# Patient Record
Sex: Female | Born: 1980
Health system: Southern US, Community
[De-identification: ages and names within clinical notes are randomized; demographics above are authoritative.]

## PROBLEM LIST (undated history)

## (undated) DIAGNOSIS — M549 Dorsalgia, unspecified: Secondary | ICD-10-CM

## (undated) DIAGNOSIS — J45909 Unspecified asthma, uncomplicated: Secondary | ICD-10-CM

## (undated) DIAGNOSIS — O99345 Other mental disorders complicating the puerperium: Secondary | ICD-10-CM

## (undated) DIAGNOSIS — F418 Other specified anxiety disorders: Secondary | ICD-10-CM

## (undated) DIAGNOSIS — R5383 Other fatigue: Secondary | ICD-10-CM

## (undated) DIAGNOSIS — F909 Attention-deficit hyperactivity disorder, unspecified type: Secondary | ICD-10-CM

## (undated) DIAGNOSIS — Z6830 Body mass index (BMI) 30.0-30.9, adult: Secondary | ICD-10-CM

## (undated) DIAGNOSIS — L719 Rosacea, unspecified: Secondary | ICD-10-CM

## (undated) DIAGNOSIS — E559 Vitamin D deficiency, unspecified: Secondary | ICD-10-CM

## (undated) DIAGNOSIS — R0602 Shortness of breath: Secondary | ICD-10-CM

## (undated) DIAGNOSIS — E039 Hypothyroidism, unspecified: Secondary | ICD-10-CM

## (undated) DIAGNOSIS — F53 Postpartum depression: Secondary | ICD-10-CM

## (undated) HISTORY — DX: Other fatigue: R53.83

## (undated) HISTORY — DX: Vitamin D deficiency, unspecified: E55.9

## (undated) HISTORY — DX: Hypothyroidism, unspecified: E03.9

## (undated) HISTORY — DX: Other mental disorders complicating the puerperium: O99.345

## (undated) HISTORY — DX: Shortness of breath: R06.02

## (undated) HISTORY — DX: Dorsalgia, unspecified: M54.9

## (undated) HISTORY — DX: Attention-deficit hyperactivity disorder, unspecified type: F90.9

## (undated) HISTORY — DX: Body mass index (BMI) 30.0-30.9, adult: Z68.30

## (undated) HISTORY — PX: WISDOM TOOTH EXTRACTION: SHX21

## (undated) HISTORY — DX: Other specified anxiety disorders: F41.8

## (undated) HISTORY — DX: Rosacea, unspecified: L71.9

## (undated) HISTORY — DX: Unspecified asthma, uncomplicated: J45.909

## (undated) HISTORY — DX: Postpartum depression: F53.0

---

## 2013-01-21 DIAGNOSIS — J302 Other seasonal allergic rhinitis: Secondary | ICD-10-CM | POA: Insufficient documentation

## 2013-01-21 DIAGNOSIS — F419 Anxiety disorder, unspecified: Secondary | ICD-10-CM | POA: Insufficient documentation

## 2013-02-27 DIAGNOSIS — R053 Chronic cough: Secondary | ICD-10-CM | POA: Insufficient documentation

## 2013-02-27 HISTORY — DX: Chronic cough: R05.3

## 2013-06-07 DIAGNOSIS — G4726 Circadian rhythm sleep disorder, shift work type: Secondary | ICD-10-CM | POA: Insufficient documentation

## 2013-11-25 DIAGNOSIS — R87618 Other abnormal cytological findings on specimens from cervix uteri: Secondary | ICD-10-CM | POA: Insufficient documentation

## 2013-11-25 DIAGNOSIS — N870 Mild cervical dysplasia: Secondary | ICD-10-CM | POA: Insufficient documentation

## 2015-01-22 DIAGNOSIS — F513 Sleepwalking [somnambulism]: Secondary | ICD-10-CM | POA: Insufficient documentation

## 2015-01-22 HISTORY — DX: Sleepwalking (somnambulism): F51.3

## 2016-03-14 ENCOUNTER — Other Ambulatory Visit (HOSPITAL_COMMUNITY)
Admission: RE | Admit: 2016-03-14 | Discharge: 2016-03-14 | Disposition: A | Discharge status: Home / Self Care | Payer: BLUE CROSS/BLUE SHIELD | Source: Ambulatory Visit | Attending: Obstetrics & Gynecology | Admitting: Obstetrics & Gynecology

## 2016-03-14 ENCOUNTER — Other Ambulatory Visit: Payer: Self-pay | Admitting: Obstetrics & Gynecology

## 2016-03-14 DIAGNOSIS — Z01419 Encounter for gynecological examination (general) (routine) without abnormal findings: Secondary | ICD-10-CM | POA: Insufficient documentation

## 2016-03-14 DIAGNOSIS — Z1151 Encounter for screening for human papillomavirus (HPV): Secondary | ICD-10-CM | POA: Diagnosis present

## 2016-03-16 LAB — CYTOLOGY - PAP

## 2016-05-25 ENCOUNTER — Encounter: Payer: Self-pay | Admitting: Primary Care

## 2016-05-25 ENCOUNTER — Ambulatory Visit (INDEPENDENT_AMBULATORY_CARE_PROVIDER_SITE_OTHER): Payer: BLUE CROSS/BLUE SHIELD | Admitting: Primary Care

## 2016-05-25 VITALS — BP 130/94 | HR 71 | Temp 98.8°F | Ht 65.0 in | Wt 190.1 lb

## 2016-05-25 DIAGNOSIS — F909 Attention-deficit hyperactivity disorder, unspecified type: Secondary | ICD-10-CM | POA: Diagnosis not present

## 2016-05-25 DIAGNOSIS — E039 Hypothyroidism, unspecified: Secondary | ICD-10-CM | POA: Diagnosis not present

## 2016-05-25 MED ORDER — LEVOTHYROXINE SODIUM 125 MCG PO TABS
ORAL_TABLET | ORAL | 1 refills | Status: DC
Start: 2016-05-25 — End: 2016-12-08

## 2016-05-25 NOTE — Assessment & Plan Note (Signed)
Managed on Adderall XR 15 mg per psychiatry. Feels well managed on this medication.  Also using alprazolam PRN for sleep walking.

## 2016-05-25 NOTE — Progress Notes (Signed)
Pre visit review using our clinic review tool, if applicable. No additional management support is needed unless otherwise documented below in the visit note. 

## 2016-05-25 NOTE — Patient Instructions (Signed)
Schedule a lab only appointment in January for repeat TSH.  I sent refills of levothyroxine 125 mcg to your pharmacy.  It was a pleasure to meet you today! Please don't hesitate to call me with any questions. Welcome to [facility]!

## 2016-05-25 NOTE — Assessment & Plan Note (Signed)
Diagnosed 4 years ago, stable on levothyroxine 125 mcg for years. TSH check due in January. Refills of levothyroxine sent to pharmacy.

## 2016-05-25 NOTE — Progress Notes (Signed)
   Subjective:    Patient ID: Christine Adams, female    DOB: 1980/07/09, 35 y.o.   MRN: [id number]  HPI  Christine Adams is a 35 year old female who presents today to establish care and discuss the problems mentioned below. Will obtain old records. Her last physical was in 2015. She does follow with GYN.  1) Hypothyroidism: Diagnosed 4 years ago. Currently managed on levothyroxine 125 mcg. Last TSH was normal in January 2017. She's been stable on the 125 mcg dose for years. She's been getting refills from the [facility].  2) ADHD: Symptoms present most of her life. Following with psychiatry. Currently managed on Adderall XR 15 mg. She's also managed on alprazolam 0.25 mg for sleep walking. She only takes this medication sparingly before big events/tests. She's been on numerous sleep medications in the past which caused her to feel groggy. She does well on alprazolam. A bottle of 30 tablets will last for 1 year.  Review of Systems  Respiratory: Negative for shortness of breath.   Cardiovascular: Negative for chest pain.  Endocrine: Negative for cold intolerance.  Neurological: Negative for headaches.  Psychiatric/Behavioral: The patient is not nervous/anxious.        Feels well managed on Adderall XR 15 mg.       Past Medical History:  Diagnosis Date  . ADHD   . Hypothyroidism      Social History   Social History  . Marital status: Significant Other    Spouse name: N/A  . Number of children: N/A  . Years of education: N/A   Occupational History  . Not on file.   Social History Main Topics  . Smoking status: Never Smoker  . Smokeless tobacco: Not on file  . Alcohol use Yes  . Drug use: Unknown  . Sexual activity: Not on file   Other Topics Concern  . Not on file   Social History Narrative   Single.   No children.   In school for Nurse Practitioner.   Enjoys riding horses, spending outdoors.    Past Surgical History:  Procedure Laterality Date  . WISDOM TOOTH  EXTRACTION      Family History  Problem Relation Age of Onset  . Clotting disorder Mother   . Sudden Cardiac Death Father   . Hypertension Father   . Hyperlipidemia Father   . ADD / ADHD Sister   . Diabetes Maternal Grandmother   . Mesothelioma Maternal Grandmother   . Alcohol abuse Paternal Grandmother   . Hypertension Paternal Grandfather     No Known Allergies  No current outpatient prescriptions on file prior to visit.   No current facility-administered medications on file prior to visit.     BP (!) 130/94   Pulse 71   Temp 98.8 F (37.1 C) (Oral)   Ht 5\' 5"  (1.651 m)   Wt 190 lb 1.9 oz (86.2 kg)   SpO2 98%   BMI 31.64 kg/m    Objective:   Physical Exam  Constitutional: She appears well-nourished.  Neck: Neck supple. No thyromegaly present.  Cardiovascular: Normal rate and regular rhythm.   Pulmonary/Chest: Effort normal and breath sounds normal.  Skin: Skin is warm and dry.  Psychiatric: She has a normal mood and affect.          Assessment & Plan:

## 2016-09-12 ENCOUNTER — Other Ambulatory Visit (HOSPITAL_COMMUNITY)
Admission: RE | Admit: 2016-09-12 | Discharge: 2016-09-12 | Disposition: A | Discharge status: Home / Self Care | Payer: BLUE CROSS/BLUE SHIELD | Source: Ambulatory Visit | Attending: Obstetrics and Gynecology | Admitting: Obstetrics and Gynecology

## 2016-09-12 ENCOUNTER — Other Ambulatory Visit: Payer: Self-pay | Admitting: Obstetrics & Gynecology

## 2016-09-12 DIAGNOSIS — Z01419 Encounter for gynecological examination (general) (routine) without abnormal findings: Secondary | ICD-10-CM | POA: Diagnosis present

## 2016-09-14 LAB — CYTOLOGY - PAP: DIAGNOSIS: NEGATIVE

## 2016-12-05 ENCOUNTER — Other Ambulatory Visit: Payer: Self-pay | Admitting: Primary Care

## 2016-12-05 DIAGNOSIS — Z0000 Encounter for general adult medical examination without abnormal findings: Secondary | ICD-10-CM

## 2016-12-05 DIAGNOSIS — E039 Hypothyroidism, unspecified: Secondary | ICD-10-CM

## 2016-12-06 ENCOUNTER — Other Ambulatory Visit (INDEPENDENT_AMBULATORY_CARE_PROVIDER_SITE_OTHER): Payer: BLUE CROSS/BLUE SHIELD

## 2016-12-06 DIAGNOSIS — Z0000 Encounter for general adult medical examination without abnormal findings: Secondary | ICD-10-CM

## 2016-12-06 DIAGNOSIS — E039 Hypothyroidism, unspecified: Secondary | ICD-10-CM

## 2016-12-06 LAB — COMPREHENSIVE METABOLIC PANEL
ALT: 15 U/L (ref 0–35)
AST: 16 U/L (ref 0–37)
Albumin: 4 g/dL (ref 3.5–5.2)
Alkaline Phosphatase: 82 U/L (ref 39–117)
BUN: 11 mg/dL (ref 6–23)
CO2: 29 mEq/L (ref 19–32)
Calcium: 9.2 mg/dL (ref 8.4–10.5)
Chloride: 105 mEq/L (ref 96–112)
Creatinine, Ser: 0.76 mg/dL (ref 0.40–1.20)
GFR: 92.14 mL/min (ref >60.00)
GLUCOSE: 88 mg/dL (ref 70–99)
Potassium: 4.2 mEq/L (ref 3.5–5.1)
Sodium: 138 mEq/L (ref 135–145)
Total Bilirubin: 0.6 mg/dL (ref 0.2–1.2)
Total Protein: 7.1 g/dL (ref 6.0–8.3)

## 2016-12-06 LAB — TSH: TSH: 1.12 u[IU]/mL (ref 0.35–4.50)

## 2016-12-06 LAB — LIPID PANEL
Cholesterol: 147 mg/dL (ref 0–200)
HDL: 38.4 mg/dL — AB (ref >39.00)
LDL CALC: 76 mg/dL (ref 0–99)
NONHDL: 108.36
Total CHOL/HDL Ratio: 4
Triglycerides: 164 mg/dL — ABNORMAL HIGH (ref 0.0–149.0)
VLDL: 32.8 mg/dL (ref 0.0–40.0)

## 2016-12-06 LAB — HEMOGLOBIN A1C: Hgb A1c MFr Bld: 5.2 % (ref 4.6–6.5)

## 2016-12-06 NOTE — Addendum Note (Signed)
Addended by: Frutoso Chase A on: 12/06/2016 03:26 PM   Modules accepted: Orders

## 2016-12-08 ENCOUNTER — Ambulatory Visit (INDEPENDENT_AMBULATORY_CARE_PROVIDER_SITE_OTHER): Payer: BLUE CROSS/BLUE SHIELD | Admitting: Primary Care

## 2016-12-08 ENCOUNTER — Encounter: Payer: Self-pay | Admitting: Primary Care

## 2016-12-08 DIAGNOSIS — F909 Attention-deficit hyperactivity disorder, unspecified type: Secondary | ICD-10-CM | POA: Diagnosis not present

## 2016-12-08 DIAGNOSIS — Z0001 Encounter for general adult medical examination with abnormal findings: Secondary | ICD-10-CM | POA: Insufficient documentation

## 2016-12-08 DIAGNOSIS — E039 Hypothyroidism, unspecified: Secondary | ICD-10-CM

## 2016-12-08 DIAGNOSIS — Z0000 Encounter for general adult medical examination without abnormal findings: Secondary | ICD-10-CM | POA: Insufficient documentation

## 2016-12-08 MED ORDER — LEVOTHYROXINE SODIUM 125 MCG PO TABS: ORAL_TABLET | ORAL | 3 refills | Status: DC

## 2016-12-08 NOTE — Assessment & Plan Note (Signed)
Recent TSH stable, continue levothyroxine 125 mcg.

## 2016-12-08 NOTE — Assessment & Plan Note (Signed)
Well controlled on current regimen. Following with psychiatry.

## 2016-12-08 NOTE — Assessment & Plan Note (Signed)
Immunizations UTD. Pap UTD. Discussed the importance of a healthy diet and regular exercise in order for weight loss, and to reduce the risk of other medical problems. Commended her on her weight loss through [employer]. Exam unremarkable. Labs stable. Follow up in 1 year for annual exam.

## 2016-12-08 NOTE — Progress Notes (Signed)
Subjective:    Patient ID: Christine Adams, female    DOB: May 27, 1981, 36 y.o.   MRN: [id number]  HPI  [name] is a 36 year old female who presents today for complete physical.  Immunizations: -Tetanus: Completed in 2012 -Influenza: Completed last season   Diet: She endorses a healthy diet. She joined [employer]. Breakfast: [place] yogurt, fruit Lunch: Meat, fruit Dinner: Meat, vegetables, some starches Snacks: None Desserts: Daily, healthier options Beverages: Water  Exercise: She is riding horses Eye exam: Completes annually Dental exam: Completes 2-3 times annually Pap Smear: Completed in 2018, due in three years   Review of Systems  Constitutional: Negative for unexpected weight change.  HENT: Negative for rhinorrhea.   Respiratory: Negative for cough and shortness of breath.   Cardiovascular: Negative for chest pain.  Gastrointestinal: Negative for constipation and diarrhea.  Genitourinary: Negative for difficulty urinating and menstrual problem.  Musculoskeletal: Negative for arthralgias and myalgias.  Skin: Negative for rash.  Allergic/Immunologic: Negative for environmental allergies.  Neurological: Negative for dizziness, numbness and headaches.  Psychiatric/Behavioral:       Denies concerns for anxiety or depression       Past Medical History:  Diagnosis Date  . ADHD   . Hypothyroidism      Social History   Social History  . Marital status: Significant Other    Spouse name: N/A  . Number of children: N/A  . Years of education: N/A   Occupational History  . Not on file.   Social History Main Topics  . Smoking status: Never Smoker  . Smokeless tobacco: Never Used  . Alcohol use Yes  . Drug use: Unknown  . Sexual activity: Not on file   Other Topics Concern  . Not on file   Social History Narrative   Single.   No children.   In school for Nurse Practitioner.   Enjoys riding horses, spending outdoors.    Past Surgical History:    Procedure Laterality Date  . WISDOM TOOTH EXTRACTION      Family History  Problem Relation Age of Onset  . Clotting disorder Mother   . Sudden Cardiac Death Father   . Hypertension Father   . Hyperlipidemia Father   . ADD / ADHD Sister   . Diabetes Maternal Grandmother   . Mesothelioma Maternal Grandmother   . Alcohol abuse Paternal Grandmother   . Hypertension Paternal Grandfather     No Known Allergies  Current Outpatient Prescriptions on File Prior to Visit  Medication Sig Dispense Refill  . ADDERALL XR 15 MG 24 hr capsule Take 15 mg by mouth daily.   0  . ALPRAZolam (XANAX) 0.25 MG tablet Take 0.25 mg by mouth as needed for anxiety.    . cetirizine (ZYRTEC) 10 MG tablet Take 10 mg by mouth daily.    . cholecalciferol (VITAMIN D) 1000 units tablet Take 1,000 Units by mouth daily.    Marland Kitchen KRILL OIL PO Take by mouth.    . LO LOESTRIN FE 1 MG-10 MCG / 10 MCG tablet Take 1 tablet by mouth daily.   3  . Omeprazole Magnesium (PRILOSEC OTC PO) Take 20 mg by mouth daily.     No current facility-administered medications on file prior to visit.     BP 120/78   Pulse 88   Temp 98 F (36.7 C) (Oral)   Ht 5\' 5"  (1.651 m)   Wt 186 lb 12.8 oz (84.7 kg)   SpO2 99%   BMI 31.09  kg/m    Objective:   Physical Exam  Constitutional: She is oriented to person, place, and time. She appears well-nourished.  HENT:  Right Ear: Tympanic membrane and ear canal normal.  Left Ear: Tympanic membrane and ear canal normal.  Nose: Nose normal.  Mouth/Throat: Oropharynx is clear and moist.  Eyes: Conjunctivae and EOM are normal. Pupils are equal, round, and reactive to light.  Neck: Neck supple. No thyromegaly present.  Cardiovascular: Normal rate and regular rhythm.   No murmur heard. Pulmonary/Chest: Effort normal and breath sounds normal. She has no rales.  Abdominal: Soft. Bowel sounds are normal. There is no tenderness.  Musculoskeletal: Normal range of motion.  Lymphadenopathy:    She  has no cervical adenopathy.  Neurological: She is alert and oriented to person, place, and time. She has normal reflexes. No cranial nerve deficit.  Skin: Skin is warm and dry. No rash noted.  Psychiatric: She has a normal mood and affect.          Assessment & Plan:

## 2016-12-08 NOTE — Patient Instructions (Signed)
I sent refills of your levothyroxine 125 mcg tablets to your pharmacy.  Continue exercising. You should be getting 150 minutes of moderate intensity exercise weekly.  Congratulations on your weight loss, keep up the great work!  Follow up in 1 year for your annual exam or sooner if needed.  It was a pleasure to see you today!

## 2017-05-05 ENCOUNTER — Encounter: Payer: Self-pay | Admitting: Primary Care

## 2017-05-25 ENCOUNTER — Encounter: Payer: Self-pay | Admitting: Family

## 2017-05-25 ENCOUNTER — Ambulatory Visit: Payer: Self-pay | Admitting: Family

## 2017-05-25 VITALS — BP 140/90 | HR 93 | Temp 98.5°F

## 2017-05-25 DIAGNOSIS — B9689 Other specified bacterial agents as the cause of diseases classified elsewhere: Secondary | ICD-10-CM

## 2017-05-25 DIAGNOSIS — F902 Attention-deficit hyperactivity disorder, combined type: Secondary | ICD-10-CM | POA: Diagnosis not present

## 2017-05-25 DIAGNOSIS — J019 Acute sinusitis, unspecified: Principal | ICD-10-CM

## 2017-05-25 MED ORDER — AMOXICILLIN 875 MG PO TABS: 875.0000 mg | ORAL_TABLET | Freq: Two times a day (BID) | ORAL | 0 refills | Status: DC

## 2017-05-25 MED ORDER — PREDNISONE 10 MG PO TABS: 30.0000 mg | ORAL_TABLET | Freq: Every day | ORAL | 0 refills | Status: DC

## 2017-05-25 MED ORDER — FLUCONAZOLE 150 MG PO TABS: ORAL_TABLET | ORAL | 0 refills | Status: DC

## 2017-05-25 NOTE — Progress Notes (Signed)
S: Cold symptoms for 3 weeks now localized into the sinuses, ear pain and pressure , post nasal drainage discolored and thick, cough and congestion, malaise and fatigue  O: Vital signs stable ENT TMs retracted fluid bubbles, nasal mucosa erythematous, swollen with purulent rhinorrhea, frontal TTP, pharynx clear neck supple without nodes nontender, heart RSR lungs are clear  A acute bacterial sinusitis   P: Rx amoxicillin, Diflucan for when necessary Pred pulse ,Supportive measures discussed. Follow up prn not improving

## 2017-06-09 DIAGNOSIS — L905 Scar conditions and fibrosis of skin: Secondary | ICD-10-CM | POA: Diagnosis not present

## 2017-06-09 DIAGNOSIS — Z872 Personal history of diseases of the skin and subcutaneous tissue: Secondary | ICD-10-CM | POA: Diagnosis not present

## 2017-06-09 DIAGNOSIS — D1801 Hemangioma of skin and subcutaneous tissue: Secondary | ICD-10-CM | POA: Diagnosis not present

## 2017-06-09 DIAGNOSIS — L814 Other melanin hyperpigmentation: Secondary | ICD-10-CM | POA: Diagnosis not present

## 2017-06-09 DIAGNOSIS — K130 Diseases of lips: Secondary | ICD-10-CM | POA: Diagnosis not present

## 2017-06-09 DIAGNOSIS — D229 Melanocytic nevi, unspecified: Secondary | ICD-10-CM | POA: Diagnosis not present

## 2017-06-09 DIAGNOSIS — L718 Other rosacea: Secondary | ICD-10-CM | POA: Diagnosis not present

## 2017-07-06 DIAGNOSIS — S62354A Nondisplaced fracture of shaft of fourth metacarpal bone, right hand, initial encounter for closed fracture: Secondary | ICD-10-CM | POA: Diagnosis not present

## 2017-08-17 DIAGNOSIS — F902 Attention-deficit hyperactivity disorder, combined type: Secondary | ICD-10-CM | POA: Diagnosis not present

## 2017-10-06 DIAGNOSIS — Z01411 Encounter for gynecological examination (general) (routine) with abnormal findings: Secondary | ICD-10-CM | POA: Diagnosis not present

## 2017-10-06 DIAGNOSIS — Z3009 Encounter for other general counseling and advice on contraception: Secondary | ICD-10-CM | POA: Diagnosis not present

## 2017-10-06 DIAGNOSIS — D069 Carcinoma in situ of cervix, unspecified: Secondary | ICD-10-CM | POA: Diagnosis not present

## 2017-11-09 DIAGNOSIS — F902 Attention-deficit hyperactivity disorder, combined type: Secondary | ICD-10-CM | POA: Diagnosis not present

## 2017-11-27 ENCOUNTER — Encounter: Payer: Self-pay | Admitting: Primary Care

## 2017-11-27 ENCOUNTER — Ambulatory Visit (INDEPENDENT_AMBULATORY_CARE_PROVIDER_SITE_OTHER): Payer: 59 | Admitting: Primary Care

## 2017-11-27 VITALS — BP 120/76 | HR 67 | Temp 98.2°F | Ht 65.0 in | Wt 186.5 lb

## 2017-11-27 DIAGNOSIS — F909 Attention-deficit hyperactivity disorder, unspecified type: Secondary | ICD-10-CM | POA: Diagnosis not present

## 2017-11-27 DIAGNOSIS — E039 Hypothyroidism, unspecified: Secondary | ICD-10-CM | POA: Diagnosis not present

## 2017-11-27 DIAGNOSIS — Z0000 Encounter for general adult medical examination without abnormal findings: Secondary | ICD-10-CM

## 2017-11-27 DIAGNOSIS — J45909 Unspecified asthma, uncomplicated: Secondary | ICD-10-CM | POA: Insufficient documentation

## 2017-11-27 DIAGNOSIS — J4520 Mild intermittent asthma, uncomplicated: Secondary | ICD-10-CM | POA: Diagnosis not present

## 2017-11-27 MED ORDER — BECLOMETHASONE DIPROPIONATE 80 MCG/ACT IN AERS: 2.0000 | INHALATION_SPRAY | Freq: Every day | RESPIRATORY_TRACT | 5 refills | Status: DC

## 2017-11-27 NOTE — Assessment & Plan Note (Signed)
Repeat TSH pending, continue levothyroxine 125 mcg as prescribed. Will adjust accordingly if needed.

## 2017-11-27 NOTE — Assessment & Plan Note (Signed)
Feels well managed on current regimen, following with psychiatry.

## 2017-11-27 NOTE — Assessment & Plan Note (Addendum)
Intermittent for years, symptoms mostly during seasonal changes. Compliant to Qvar during seasonal changes, Spring and Fall. Infrequent use of albuterol. Continue current regimen, refills sent for Qvar.

## 2017-11-27 NOTE — Patient Instructions (Signed)
Stop by the lab prior to leaving today. I will notify you of your results once received.   I sent refills of Qvar to your pharmacy.  Continue exercising. You should be getting 150 minutes of moderate intensity exercise weekly.  Ensure you are consuming 64 ounces of water daily.  Continue to work on a healthy diet.   Follow up in 1 year for your annual exam or sooner if needed.  It was a pleasure to see you today!   Preventive Care 18-39 Years, Female Preventive care refers to lifestyle choices and visits with your health care provider that can promote health and wellness. What does preventive care include?  A yearly physical exam. This is also called an annual well check.  Dental exams once or twice a year.  Routine eye exams. Ask your health care provider how often you should have your eyes checked.  Personal lifestyle choices, including: ? Daily care of your teeth and gums. ? Regular physical activity. ? Eating a healthy diet. ? Avoiding tobacco and drug use. ? Limiting alcohol use. ? Practicing safe sex. ? Taking vitamin and mineral supplements as recommended by your health care provider. What happens during an annual well check? The services and screenings done by your health care provider during your annual well check will depend on your age, overall health, lifestyle risk factors, and family history of disease. Counseling Your health care provider may ask you questions about your:  Alcohol use.  Tobacco use.  Drug use.  Emotional well-being.  Home and relationship well-being.  Sexual activity.  Eating habits.  Work and work Statistician.  Method of birth control.  Menstrual cycle.  Pregnancy history.  Screening You may have the following tests or measurements:  Height, weight, and BMI.  Diabetes screening. This is done by checking your blood sugar (glucose) after you have not eaten for a while (fasting).  Blood pressure.  Lipid and cholesterol  levels. These may be checked every 5 years starting at age 68.  Skin check.  Hepatitis C blood test.  Hepatitis B blood test.  Sexually transmitted disease (STD) testing.  BRCA-related cancer screening. This may be done if you have a family history of breast, ovarian, tubal, or peritoneal cancers.  Pelvic exam and Pap test. This may be done every 3 years starting at age 41. Starting at age 30, this may be done every 5 years if you have a Pap test in combination with an HPV test.  Discuss your test results, treatment options, and if necessary, the need for more tests with your health care provider. Vaccines Your health care provider may recommend certain vaccines, such as:  Influenza vaccine. This is recommended every year.  Tetanus, diphtheria, and acellular pertussis (Tdap, Td) vaccine. You may need a Td booster every 10 years.  Varicella vaccine. You may need this if you have not been vaccinated.  HPV vaccine. If you are 19 or younger, you may need three doses over 6 months.  Measles, mumps, and rubella (MMR) vaccine. You may need at least one dose of MMR. You may also need a second dose.  Pneumococcal 13-valent conjugate (PCV13) vaccine. You may need this if you have certain conditions and were not previously vaccinated.  Pneumococcal polysaccharide (PPSV23) vaccine. You may need one or two doses if you smoke cigarettes or if you have certain conditions.  Meningococcal vaccine. One dose is recommended if you are age 43-21 years and a Market researcher living in a residence hall, or  if you have one of several medical conditions. You may also need additional booster doses.  Hepatitis A vaccine. You may need this if you have certain conditions or if you travel or work in places where you may be exposed to hepatitis A.  Hepatitis B vaccine. You may need this if you have certain conditions or if you travel or work in places where you may be exposed to hepatitis  B.  Haemophilus influenzae type b (Hib) vaccine. You may need this if you have certain risk factors.  Talk to your health care provider about which screenings and vaccines you need and how often you need them. This information is not intended to replace advice given to you by your health care provider. Make sure you discuss any questions you have with your health care provider. Document Released: 08/09/2001 Document Revised: 03/02/2016 Document Reviewed: 04/14/2015 Elsevier Interactive Patient Education  [employer].

## 2017-11-27 NOTE — Progress Notes (Signed)
Subjective:    Patient ID: Christine Adams, female    DOB: 12-14-1980, 37 y.o.   MRN: [id number]  HPI  Christine Adams is a 37 year old female who presents today for complete physical.  Immunizations: -Tetanus: Completed in 2014 -Influenza: Completed last season   Diet: She endorses a healthy diet. 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  Pap Smear: Completed in 2019, follows with GYN.    Review of Systems  Constitutional: Negative for unexpected weight change.  HENT: Negative for rhinorrhea.   Respiratory: Negative for cough and shortness of breath.   Cardiovascular: Negative for chest pain.  Gastrointestinal: Negative for constipation and diarrhea.  Genitourinary: Negative for difficulty urinating and menstrual problem.  Musculoskeletal: Negative for arthralgias and myalgias.  Skin: Negative for rash.  Allergic/Immunologic: Negative for environmental allergies.  Neurological: Negative for dizziness, numbness and headaches.  Psychiatric/Behavioral:       Follows with psychiatry, feels well managed. Infrequent use of alprazolam.       Past Medical History:  Diagnosis Date  . ADHD   . Hypothyroidism      Social History   Socioeconomic History  . Marital status: Significant Other    Spouse name: Not on file  . Number of children: Not on file  . Years of education: Not on file  . Highest education level: Not on file  Occupational History  . Not on file  Social Needs  . Financial resource strain: Not on file  . Food insecurity:    Worry: Not on file    Inability: Not on file  . Transportation needs:    Medical: Not on file    Non-medical: Not on file  Tobacco Use  . Smoking status: Never  Smoker  . Smokeless tobacco: Never Used  Substance and Sexual Activity  . Alcohol use: Yes  . Drug use: Not on file  . Sexual activity: Not on file  Lifestyle  . Physical activity:    Days per week: Not on file    Minutes per session: Not on file  . Stress: Not on file  Relationships  . Social connections:    Talks on phone: Not on file    Gets together: Not on file    Attends religious service: Not on file    Active member of club or organization: Not on file    Attends meetings of clubs or organizations: Not on file    Relationship status: Not on file  . Intimate partner violence:    Fear of current or ex partner: Not on file    Emotionally abused: Not on file    Physically abused: Not on file    Forced sexual activity: Not on file  Other Topics Concern  . Not on file  Social History Narrative   Single.   No children.   In school for Nurse Practitioner.   Enjoys riding horses, spending outdoors.    Past Surgical History:  Procedure Laterality Date  . WISDOM TOOTH EXTRACTION      Family History  Problem Relation Age of Onset  . Clotting disorder Mother   . Sudden Cardiac Death Father   . Hypertension Father   . Hyperlipidemia Father   . ADD / ADHD Sister   . Diabetes Maternal  Grandmother   . Mesothelioma Maternal Grandmother   . Alcohol abuse Paternal Grandmother   . Hypertension Paternal Grandfather     No Known Allergies  Current Outpatient Medications on File Prior to Visit  Medication Sig Dispense Refill  . ADDERALL XR 15 MG 24 hr capsule Take 15 mg by mouth daily.   0  . albuterol (PROVENTIL HFA;VENTOLIN HFA) 108 (90 Base) MCG/ACT inhaler Inhale into the lungs every 6 (six) hours as needed.    . ALPRAZolam (XANAX) 0.25 MG tablet Take 0.25 mg by mouth as needed for anxiety.    . cetirizine (ZYRTEC) 10 MG tablet Take 10 mg by mouth daily.    . cholecalciferol (VITAMIN D) 1000 units tablet Take 1,000 Units by mouth daily.    Marland Kitchen KRILL OIL PO Take by mouth.     . levothyroxine (SYNTHROID, LEVOTHROID) 125 MCG tablet Take 1 tablet by mouth every morning on an empty stomach with a full glass of water. 90 tablet 3  . Multiple Vitamin (MULTIVITAMIN) tablet Take 1 tablet by mouth daily.    . Omeprazole Magnesium (PRILOSEC OTC PO) Take 20 mg by mouth daily.     No current facility-administered medications on file prior to visit.     BP 120/76   Pulse 67   Temp 98.2 F (36.8 C) (Oral)   Ht 5\' 5"  (1.651 m)   Wt 186 lb 8 oz (84.6 kg)   LMP 11/07/2017   SpO2 97%   BMI 31.04 kg/m    Objective:   Physical Exam  Constitutional: She is oriented to person, place, and time. She appears well-nourished.  HENT:  Mouth/Throat: No oropharyngeal exudate.  Eyes: Pupils are equal, round, and reactive to light. EOM are normal.  Neck: Neck supple. No thyromegaly present.  Cardiovascular: Normal rate and regular rhythm.  Respiratory: Effort normal and breath sounds normal.  GI: Soft. Bowel sounds are normal. There is no tenderness.  Musculoskeletal: Normal range of motion.  Neurological: She is alert and oriented to person, place, and time.  Skin: Skin is warm and dry.  Psychiatric: She has a normal mood and affect.           Assessment & Plan:

## 2017-11-27 NOTE — Assessment & Plan Note (Signed)
Immunizations UTD. Pap smear UTD. Commended her on a healthy diet, continue regular exercise. Exam unremarkable. Labs pending. Follow up in 1 year for CPE.

## 2017-11-28 LAB — COMPREHENSIVE METABOLIC PANEL
ALBUMIN: 4.3 g/dL (ref 3.5–5.2)
ALT: 26 U/L (ref 0–35)
AST: 22 U/L (ref 0–37)
Alkaline Phosphatase: 88 U/L (ref 39–117)
BUN: 16 mg/dL (ref 6–23)
CALCIUM: 9.5 mg/dL (ref 8.4–10.5)
CHLORIDE: 102 meq/L (ref 96–112)
CO2: 29 mEq/L (ref 19–32)
Creatinine, Ser: 0.65 mg/dL (ref 0.40–1.20)
GFR: 109.74 mL/min (ref >60.00)
Glucose, Bld: 75 mg/dL (ref 70–99)
POTASSIUM: 4.1 meq/L (ref 3.5–5.1)
Sodium: 139 mEq/L (ref 135–145)
Total Bilirubin: 0.6 mg/dL (ref 0.2–1.2)
Total Protein: 7.1 g/dL (ref 6.0–8.3)

## 2017-11-28 LAB — LIPID PANEL
CHOLESTEROL: 155 mg/dL (ref 0–200)
HDL: 35.5 mg/dL — ABNORMAL LOW (ref >39.00)
LDL CALC: 86 mg/dL (ref 0–99)
NonHDL: 119.42
TRIGLYCERIDES: 168 mg/dL — AB (ref 0.0–149.0)
Total CHOL/HDL Ratio: 4
VLDL: 33.6 mg/dL (ref 0.0–40.0)

## 2017-11-28 LAB — TSH: TSH: 1.82 u[IU]/mL (ref 0.35–4.50)

## 2018-02-15 ENCOUNTER — Other Ambulatory Visit: Payer: Self-pay | Admitting: Primary Care

## 2018-02-15 DIAGNOSIS — E039 Hypothyroidism, unspecified: Secondary | ICD-10-CM

## 2018-03-01 DIAGNOSIS — H5213 Myopia, bilateral: Secondary | ICD-10-CM | POA: Diagnosis not present

## 2018-03-08 DIAGNOSIS — F902 Attention-deficit hyperactivity disorder, combined type: Secondary | ICD-10-CM | POA: Diagnosis not present

## 2018-07-12 DIAGNOSIS — L700 Acne vulgaris: Secondary | ICD-10-CM | POA: Diagnosis not present

## 2018-07-12 DIAGNOSIS — D485 Neoplasm of uncertain behavior of skin: Secondary | ICD-10-CM | POA: Diagnosis not present

## 2018-07-12 DIAGNOSIS — L718 Other rosacea: Secondary | ICD-10-CM | POA: Diagnosis not present

## 2018-07-12 DIAGNOSIS — L814 Other melanin hyperpigmentation: Secondary | ICD-10-CM | POA: Diagnosis not present

## 2018-07-12 DIAGNOSIS — D229 Melanocytic nevi, unspecified: Secondary | ICD-10-CM | POA: Diagnosis not present

## 2018-07-31 DIAGNOSIS — F902 Attention-deficit hyperactivity disorder, combined type: Secondary | ICD-10-CM | POA: Diagnosis not present

## 2018-12-03 DIAGNOSIS — F902 Attention-deficit hyperactivity disorder, combined type: Secondary | ICD-10-CM | POA: Diagnosis not present

## 2018-12-10 ENCOUNTER — Other Ambulatory Visit: Payer: Self-pay

## 2018-12-10 ENCOUNTER — Ambulatory Visit: Payer: 59 | Admitting: Primary Care

## 2018-12-10 ENCOUNTER — Encounter: Payer: Self-pay | Admitting: Primary Care

## 2018-12-10 VITALS — BP 124/82 | HR 74 | Temp 98.2°F | Ht 65.0 in | Wt 183.2 lb

## 2018-12-10 DIAGNOSIS — F909 Attention-deficit hyperactivity disorder, unspecified type: Secondary | ICD-10-CM | POA: Diagnosis not present

## 2018-12-10 DIAGNOSIS — J4520 Mild intermittent asthma, uncomplicated: Secondary | ICD-10-CM

## 2018-12-10 DIAGNOSIS — Z0000 Encounter for general adult medical examination without abnormal findings: Secondary | ICD-10-CM

## 2018-12-10 DIAGNOSIS — L719 Rosacea, unspecified: Secondary | ICD-10-CM

## 2018-12-10 DIAGNOSIS — E039 Hypothyroidism, unspecified: Secondary | ICD-10-CM

## 2018-12-10 LAB — COMPREHENSIVE METABOLIC PANEL
ALT: 17 U/L (ref 0–35)
AST: 16 U/L (ref 0–37)
Albumin: 4.3 g/dL (ref 3.5–5.2)
Alkaline Phosphatase: 85 U/L (ref 39–117)
BUN: 11 mg/dL (ref 6–23)
CO2: 28 mEq/L (ref 19–32)
Calcium: 9.2 mg/dL (ref 8.4–10.5)
Chloride: 104 mEq/L (ref 96–112)
Creatinine, Ser: 0.7 mg/dL (ref 0.40–1.20)
GFR: 94.25 mL/min (ref >60.00)
Glucose, Bld: 77 mg/dL (ref 70–99)
Potassium: 4.3 mEq/L (ref 3.5–5.1)
Sodium: 139 mEq/L (ref 135–145)
Total Bilirubin: 0.5 mg/dL (ref 0.2–1.2)
Total Protein: 6.7 g/dL (ref 6.0–8.3)

## 2018-12-10 LAB — LIPID PANEL
Cholesterol: 141 mg/dL (ref 0–200)
HDL: 37.6 mg/dL — ABNORMAL LOW (ref >39.00)
LDL Cholesterol: 86 mg/dL (ref 0–99)
NonHDL: 103.33
Total CHOL/HDL Ratio: 4
Triglycerides: 89 mg/dL (ref 0.0–149.0)
VLDL: 17.8 mg/dL (ref 0.0–40.0)

## 2018-12-10 LAB — TSH: TSH: 2.46 u[IU]/mL (ref 0.35–4.50)

## 2018-12-10 NOTE — Assessment & Plan Note (Signed)
Taking levothyroxine appropriately. Repeat TSH pending.

## 2018-12-10 NOTE — Assessment & Plan Note (Signed)
Immunizations UTD. Pap smear UTD. Commended her on a healthy diet and regular exercise. Exam unremarkable. Labs pending. Follow up in 1 year for CPE.

## 2018-12-10 NOTE — Assessment & Plan Note (Signed)
Doing well on Qvar for which she uses mostly during seasonal changes. Infrequent use of albuterol. Continue current regimen.

## 2018-12-10 NOTE — Patient Instructions (Signed)
Stop by the lab prior to leaving today. I will notify you of your results once received.   Continue exercising. You should be getting 150 minutes of moderate intensity exercise weekly.  Continue to work on a healthy diet, congratulations on your weight loss!  It was a pleasure to see you today!   Preventive Care 18-39 Years, Female Preventive care refers to lifestyle choices and visits with your health care provider that can promote health and wellness. What does preventive care include?   A yearly physical exam. This is also called an annual well check.  Dental exams once or twice a year.  Routine eye exams. Ask your health care provider how often you should have your eyes checked.  Personal lifestyle choices, including: ? Daily care of your teeth and gums. ? Regular physical activity. ? Eating a healthy diet. ? Avoiding tobacco and drug use. ? Limiting alcohol use. ? Practicing safe sex. ? Taking vitamin and mineral supplements as recommended by your health care provider. What happens during an annual well check? The services and screenings done by your health care provider during your annual well check will depend on your age, overall health, lifestyle risk factors, and family history of disease. Counseling Your health care provider may ask you questions about your:  Alcohol use.  Tobacco use.  Drug use.  Emotional well-being.  Home and relationship well-being.  Sexual activity.  Eating habits.  Work and work Statistician.  Method of birth control.  Menstrual cycle.  Pregnancy history. Screening You may have the following tests or measurements:  Height, weight, and BMI.  Diabetes screening. This is done by checking your blood sugar (glucose) after you have not eaten for a while (fasting).  Blood pressure.  Lipid and cholesterol levels. These may be checked every 5 years starting at age 24.  Skin check.  Hepatitis C blood test.  Hepatitis B blood  test.  Sexually transmitted disease (STD) testing.  BRCA-related cancer screening. This may be done if you have a family history of breast, ovarian, tubal, or peritoneal cancers.  Pelvic exam and Pap test. This may be done every 3 years starting at age 76. Starting at age 75, this may be done every 5 years if you have a Pap test in combination with an HPV test. Discuss your test results, treatment options, and if necessary, the need for more tests with your health care provider. Vaccines Your health care provider may recommend certain vaccines, such as:  Influenza vaccine. This is recommended every year.  Tetanus, diphtheria, and acellular pertussis (Tdap, Td) vaccine. You may need a Td booster every 10 years.  Varicella vaccine. You may need this if you have not been vaccinated.  HPV vaccine. If you are 63 or younger, you may need three doses over 6 months.  Measles, mumps, and rubella (MMR) vaccine. You may need at least one dose of MMR. You may also need a second dose.  Pneumococcal 13-valent conjugate (PCV13) vaccine. You may need this if you have certain conditions and were not previously vaccinated.  Pneumococcal polysaccharide (PPSV23) vaccine. You may need one or two doses if you smoke cigarettes or if you have certain conditions.  Meningococcal vaccine. One dose is recommended if you are age 30-21 years and a first-year college student living in a residence hall, or if you have one of several medical conditions. You may also need additional booster doses.  Hepatitis A vaccine. You may need this if you have certain conditions or if  you travel or work in places where you may be exposed to hepatitis A.  Hepatitis B vaccine. You may need this if you have certain conditions or if you travel or work in places where you may be exposed to hepatitis B.  Haemophilus influenzae type b (Hib) vaccine. You may need this if you have certain risk factors. Talk to your health care provider  about which screenings and vaccines you need and how often you need them. This information is not intended to replace advice given to you by your health care provider. Make sure you discuss any questions you have with your health care provider. Document Released: 08/09/2001 Document Revised: 01/24/2017 Document Reviewed: 04/14/2015 Elsevier Interactive Patient Education  2019 [employer].

## 2018-12-10 NOTE — Assessment & Plan Note (Addendum)
Following with dermatology, now managed on azelaic acid. Continue same.

## 2018-12-10 NOTE — Assessment & Plan Note (Signed)
Following with psychiatry and is doing well on current regimen. Continue same.

## 2018-12-10 NOTE — Progress Notes (Signed)
Subjective:    Patient ID: Christine Adams, female    DOB: February 11, 1981, 38 y.o.   MRN: [id number]  HPI  Christine Adams is a 38 year old female who presents today for complete physical.  Immunizations: -Tetanus: Completed in 2014 -Influenza: Completes annually   Diet: She endorses a healthy diet. She is meal planning which has helped with weight loss. She is eating out much less. She drinks water mostly.  Exercise: She is active through jogging and riding horses.   Eye exam: Completed in 2019 Dental exam: Completes regularly  Pap Smear: Follows with GYN, due in 2021.   Review of Systems  Constitutional: Negative for unexpected weight change.  HENT: Negative for rhinorrhea.   Respiratory: Negative for cough and shortness of breath.   Cardiovascular: Negative for chest pain.  Gastrointestinal: Negative for constipation and diarrhea.  Genitourinary: Negative for difficulty urinating.  Musculoskeletal: Negative for arthralgias and myalgias.  Skin: Negative for rash.  Allergic/Immunologic: Negative for environmental allergies.  Neurological: Negative for dizziness, numbness and headaches.  Psychiatric/Behavioral: The patient is not nervous/anxious.        Following with psychiatry, feels well managed        Past Medical History:  Diagnosis Date  . ADHD   . Hypothyroidism      Social History   Socioeconomic History  . Marital status: Married    Spouse name: Not on file  . Number of children: Not on file  . Years of education: Not on file  . Highest education level: Not on file  Occupational History  . Not on file  Social Needs  . Financial resource strain: Not on file  . Food insecurity    Worry: Not on file    Inability: Not on file  . Transportation needs    Medical: Not on file    Non-medical: Not on file  Tobacco Use  . Smoking status: Never Smoker  . Smokeless tobacco: Never Used  Substance and Sexual Activity  . Alcohol use: Yes  . Drug use: Not on file  .  Sexual activity: Not on file  Lifestyle  . Physical activity    Days per week: Not on file    Minutes per session: Not on file  . Stress: Not on file  Relationships  . Social Herbalist on phone: Not on file    Gets together: Not on file    Attends religious service: Not on file    Active member of club or organization: Not on file    Attends meetings of clubs or organizations: Not on file    Relationship status: Not on file  . Intimate partner violence    Fear of current or ex partner: Not on file    Emotionally abused: Not on file    Physically abused: Not on file    Forced sexual activity: Not on file  Other Topics Concern  . Not on file  Social History Narrative   Single.   No children.   In school for Nurse Practitioner.   Enjoys riding horses, spending outdoors.    Past Surgical History:  Procedure Laterality Date  . WISDOM TOOTH EXTRACTION      Family History  Problem Relation Age of Onset  . Clotting disorder Mother   . Sudden Cardiac Death Father   . Hypertension Father   . Hyperlipidemia Father   . ADD / ADHD Sister   . Diabetes Maternal Grandmother   . Mesothelioma Maternal Grandmother   .  Alcohol abuse Paternal Grandmother   . Hypertension Paternal Grandfather     No Known Allergies  Current Outpatient Medications on File Prior to Visit  Medication Sig Dispense Refill  . ADDERALL XR 15 MG 24 hr capsule Take 15 mg by mouth daily.   0  . albuterol (PROVENTIL HFA;VENTOLIN HFA) 108 (90 Base) MCG/ACT inhaler Inhale into the lungs every 6 (six) hours as needed.    . ALPRAZolam (XANAX) 0.25 MG tablet Take 0.25 mg by mouth as needed for anxiety.    . beclomethasone (QVAR) 80 MCG/ACT inhaler Inhale 2 puffs into the lungs daily. 1 Inhaler 5  . cetirizine (ZYRTEC) 10 MG tablet Take 10 mg by mouth daily.    . cholecalciferol (VITAMIN D) 1000 units tablet Take 1,000 Units by mouth daily.    Marland Kitchen KRILL OIL PO Take by mouth.    . levothyroxine (SYNTHROID,  LEVOTHROID) 125 MCG tablet TAKE 1 TABLET BY MOUTH ONCE DAILY IN THE MORNING ON AN EMPTY STOMACH WITH A FULL GLASS OF WATER 90 tablet 3  . Multiple Vitamin (MULTIVITAMIN) tablet Take 1 tablet by mouth daily.    . Omeprazole Magnesium (PRILOSEC OTC PO) Take 20 mg by mouth daily.    . Azelaic Acid 15 % cream      No current facility-administered medications on file prior to visit.     BP 124/82   Pulse 74   Temp 98.2 F (36.8 C) (Tympanic)   Ht 5\' 5"  (1.651 m)   Wt 183 lb 4 oz (83.1 kg)   LMP 11/30/2018   SpO2 98%   BMI 30.49 kg/m    Objective:   Physical Exam  Constitutional: She is oriented to person, place, and time. She appears well-nourished.  HENT:  Mouth/Throat: No oropharyngeal exudate.  Eyes: Pupils are equal, round, and reactive to light. EOM are normal.  Neck: Neck supple. No thyromegaly present.  Cardiovascular: Normal rate and regular rhythm.  Respiratory: Effort normal and breath sounds normal.  GI: Soft. Bowel sounds are normal. There is no abdominal tenderness.  Musculoskeletal: Normal range of motion.  Neurological: She is alert and oriented to person, place, and time.  Skin: Skin is warm and dry.  Psychiatric: She has a normal mood and affect.           Assessment & Plan:

## 2019-02-28 DIAGNOSIS — F902 Attention-deficit hyperactivity disorder, combined type: Secondary | ICD-10-CM | POA: Diagnosis not present

## 2019-02-28 DIAGNOSIS — N912 Amenorrhea, unspecified: Secondary | ICD-10-CM | POA: Diagnosis not present

## 2019-04-05 DIAGNOSIS — Z113 Encounter for screening for infections with a predominantly sexual mode of transmission: Secondary | ICD-10-CM | POA: Diagnosis not present

## 2019-04-05 DIAGNOSIS — O99211 Obesity complicating pregnancy, first trimester: Secondary | ICD-10-CM | POA: Diagnosis not present

## 2019-04-05 DIAGNOSIS — O0991 Supervision of high risk pregnancy, unspecified, first trimester: Secondary | ICD-10-CM | POA: Diagnosis not present

## 2019-04-05 DIAGNOSIS — Z124 Encounter for screening for malignant neoplasm of cervix: Secondary | ICD-10-CM | POA: Diagnosis not present

## 2019-04-05 DIAGNOSIS — Z832 Family history of diseases of the blood and blood-forming organs and certain disorders involving the immune mechanism: Secondary | ICD-10-CM | POA: Diagnosis not present

## 2019-04-05 DIAGNOSIS — E039 Hypothyroidism, unspecified: Secondary | ICD-10-CM | POA: Diagnosis not present

## 2019-04-05 DIAGNOSIS — R030 Elevated blood-pressure reading, without diagnosis of hypertension: Secondary | ICD-10-CM | POA: Diagnosis not present

## 2019-04-05 LAB — RESULTS CONSOLE HPV: CHL HPV: NEGATIVE

## 2019-04-05 LAB — OB RESULTS CONSOLE RUBELLA ANTIBODY, IGM: Rubella: IMMUNE

## 2019-04-05 LAB — HM PAP SMEAR

## 2019-04-05 LAB — OB RESULTS CONSOLE HEPATITIS B SURFACE ANTIGEN: Hepatitis B Surface Ag: NEGATIVE

## 2019-04-05 LAB — OB RESULTS CONSOLE VARICELLA ZOSTER ANTIBODY, IGG: Varicella: IMMUNE

## 2019-04-26 ENCOUNTER — Other Ambulatory Visit: Payer: Self-pay | Admitting: Primary Care

## 2019-04-26 DIAGNOSIS — E039 Hypothyroidism, unspecified: Secondary | ICD-10-CM

## 2019-05-03 DIAGNOSIS — O0992 Supervision of high risk pregnancy, unspecified, second trimester: Secondary | ICD-10-CM | POA: Diagnosis not present

## 2019-05-27 DIAGNOSIS — F902 Attention-deficit hyperactivity disorder, combined type: Secondary | ICD-10-CM | POA: Diagnosis not present

## 2019-05-30 ENCOUNTER — Ambulatory Visit (INDEPENDENT_AMBULATORY_CARE_PROVIDER_SITE_OTHER): Payer: 59 | Admitting: Podiatry

## 2019-05-30 ENCOUNTER — Encounter: Payer: Self-pay | Admitting: Podiatry

## 2019-05-30 ENCOUNTER — Other Ambulatory Visit: Payer: Self-pay

## 2019-05-30 DIAGNOSIS — M2022 Hallux rigidus, left foot: Secondary | ICD-10-CM

## 2019-05-30 DIAGNOSIS — M79672 Pain in left foot: Secondary | ICD-10-CM | POA: Diagnosis not present

## 2019-05-31 ENCOUNTER — Encounter: Payer: Self-pay | Admitting: Podiatry

## 2019-05-31 NOTE — Progress Notes (Signed)
Subjective:  Patient ID: Christine Adams, female    DOB: 1981-04-02,  MRN: [id number]  Chief Complaint  Patient presents with  . Foot Pain    Patient presents today for left foot pain at 1st metatarsal x 2 weeks.  She reports a constant ache with sharp pains at times, worse with walking or running.  She says the pain radiates up into her toe.  She has tried ice, Tylenol, rest for treatment    38 y.o. female presents with the above complaint.  Patient presents with pain of left first metatarsophalangeal joint range of motion.  Patient states that she has had this pain for a while now.  It is constant achy with sharp pains.  She denies any other pain anywhere else in the foot.  She has tried ice Tylenol rest for treatment which has not helped.  She is constantly on her feet.  She would like to know if there anything that could be done for this pain.  She is currently pregnant therefore cannot obtain x-rays.  She denies any other acute complaints.   Review of Systems: Negative except as noted in the HPI. Denies N/V/F/Ch.  Past Medical History:  Diagnosis Date  . ADHD   . Hypothyroidism     Current Outpatient Medications:  .  ADDERALL XR 15 MG 24 hr capsule, Take 15 mg by mouth daily. , Disp: , Rfl: 0 .  albuterol (PROVENTIL HFA;VENTOLIN HFA) 108 (90 Base) MCG/ACT inhaler, Inhale into the lungs every 6 (six) hours as needed., Disp: , Rfl:  .  ALPRAZolam (XANAX) 0.25 MG tablet, Take 0.25 mg by mouth as needed for anxiety., Disp: , Rfl:  .  Azelaic Acid 15 % cream, , Disp: , Rfl:  .  beclomethasone (QVAR) 80 MCG/ACT inhaler, Inhale 2 puffs into the lungs daily., Disp: 1 Inhaler, Rfl: 5 .  cetirizine (ZYRTEC) 10 MG tablet, Take 10 mg by mouth daily., Disp: , Rfl:  .  cholecalciferol (VITAMIN D) 1000 units tablet, Take 1,000 Units by mouth daily., Disp: , Rfl:  .  levothyroxine (SYNTHROID) 125 MCG tablet, TAKE 1 TABLET BY MOUTH ONCE DAILY IN THE MORNING ON AN EMPTY STOMACH WITH A FULL GLASS OF  WATER, Disp: 90 tablet, Rfl: 2 .  Multiple Vitamin (MULTIVITAMIN) tablet, Take 1 tablet by mouth daily., Disp: , Rfl:  .  Omeprazole Magnesium (PRILOSEC OTC PO), Take 20 mg by mouth daily., Disp: , Rfl:  .  valACYclovir (VALTREX) 500 MG tablet, , Disp: , Rfl:   Social History   Tobacco Use  Smoking Status Never Smoker  Smokeless Tobacco Never Used    No Known Allergies Objective:  There were no vitals filed for this visit. There is no height or weight on file to calculate BMI. Constitutional Well developed. Well nourished.  Vascular Dorsalis pedis pulses palpable bilaterally. Posterior tibial pulses palpable bilaterally. Capillary refill normal to all digits.  No cyanosis or clubbing noted. Pedal hair growth normal.  Neurologic Normal speech. Oriented to person, place, and time. Epicritic sensation to light touch grossly present bilaterally.  Dermatologic Nails well groomed and normal in appearance. No open wounds. No skin lesions.  Orthopedic:  Pain on palpation to the left first metatarsophalangeal joint.  There is pain with end range of motion to the left first metatarsophalangeal joint with active and passive range of motion.  Mild crepitus noted with the range of motion.  Pain with range of motion.  There is limited motion about 10 degrees consistent with hallux  rigidus.   Radiographs: Unable to obtain as patient is currently pregnant. Assessment:   1. Pain of left foot   2. Hallux rigidus, left foot    Plan:  Patient was evaluated and treated and all questions answered.  Left first metatarsal phalangeal joint arthritis with associated crepitus -I explained to the patient the etiology of first metatarsophalangeal joint heart arthritis/hallux rigidus and all the treatment options available.  Given that there was no x-rays taken as patient is currently pregnant I am unable to diagnose her properly.  However clinically she has crepitus in the first metatarsophalangeal joint  with end range of motion pain which makes me suspicious for hallux rigidus secondary to degenerative joint disease. -I believe patient will benefit from a steroid injection to help bring down the acute inflammation. -Patient will also benefit from custom-made orthotics with Morton's extension to help decrease the range of motion at the first metatarsophalangeal joint. -Patient will be scheduled to see [facility] for custom-made orthotics with Morton's extension.  No follow-ups on file.

## 2019-06-04 DIAGNOSIS — E039 Hypothyroidism, unspecified: Secondary | ICD-10-CM | POA: Diagnosis not present

## 2019-06-04 DIAGNOSIS — O99280 Endocrine, nutritional and metabolic diseases complicating pregnancy, unspecified trimester: Secondary | ICD-10-CM | POA: Diagnosis not present

## 2019-06-12 ENCOUNTER — Ambulatory Visit (INDEPENDENT_AMBULATORY_CARE_PROVIDER_SITE_OTHER): Payer: 59 | Admitting: Orthotics

## 2019-06-12 ENCOUNTER — Other Ambulatory Visit: Payer: Self-pay

## 2019-06-12 DIAGNOSIS — M2022 Hallux rigidus, left foot: Secondary | ICD-10-CM | POA: Diagnosis not present

## 2019-06-12 DIAGNOSIS — M79671 Pain in right foot: Secondary | ICD-10-CM

## 2019-06-12 DIAGNOSIS — M79672 Pain in left foot: Secondary | ICD-10-CM

## 2019-06-12 NOTE — Progress Notes (Signed)
Patient is here today to be evaluated and cast for CMFO.  Patient has hx of hallus rigidus), and needs a supportive orthoses that will support arch and decrease ROM 1st mpj LEFT.  Plan on morton's extension

## 2019-06-28 NOTE — L&D Delivery Note (Signed)
Delivery Note  First Stage: Labor onset: 1130 4/16 with PROM.  Augmentation: Cytotec, Pitocin Analgesia /Anesthesia intrapartum: epidural PROM at 1130 on 4/16  Second Stage: Complete dilation at 1429 Onset of pushing at 1448 FHR second stage Cat II, moderate variability with recurrent variables to 90s, baseline 140-150bpm  Delivery of a viable female on 10/12/19 at 1559 by CNM delivery of fetal head in LOA position with restitution to LOT, noted tight nuchal cord x 1;  Anterior then posterior shoulders delivered easily with gentle downward traction. Baby placed on mom's chest, and attended to by peds, initially not vigorous and cord was double clamped and cut by CNM for Peds to further evaluate at the warmer.  Immediately after delivery of infant, very large gush of fluid and clots followed, then slowed to a trickle  Arterial cord blood sample pH 7.17  Third Stage: Placenta delivered spontaneously intact with 3VC @ 1604 Placenta disposition: routine disposal Uterine tone firm / bleeding small  2nd vaginal/perineal laceration identified  Anesthesia for repair: epidural Repair in usual fashion with 2-0 Vicryl Ct-1 and 3-0 Vicryl SH.  Est. Blood Loss (mL): 74ml QBL.   Complications: none  Mom to postpartum.  Baby to Nursery.  Newborn: Birth Weight: 7#10  Apgar Scores: 5/9 Feeding planned: Breast

## 2019-07-05 DIAGNOSIS — O3442 Maternal care for other abnormalities of cervix, second trimester: Secondary | ICD-10-CM | POA: Diagnosis not present

## 2019-07-05 DIAGNOSIS — O99280 Endocrine, nutritional and metabolic diseases complicating pregnancy, unspecified trimester: Secondary | ICD-10-CM | POA: Diagnosis not present

## 2019-07-05 DIAGNOSIS — E039 Hypothyroidism, unspecified: Secondary | ICD-10-CM | POA: Diagnosis not present

## 2019-07-05 DIAGNOSIS — Z9889 Other specified postprocedural states: Secondary | ICD-10-CM | POA: Diagnosis not present

## 2019-07-17 ENCOUNTER — Other Ambulatory Visit: Payer: Self-pay

## 2019-07-17 ENCOUNTER — Ambulatory Visit: Payer: 59 | Admitting: Orthotics

## 2019-07-17 DIAGNOSIS — M2022 Hallux rigidus, left foot: Secondary | ICD-10-CM

## 2019-07-17 NOTE — Progress Notes (Signed)
F/O seem to be lost by [employer]; Richy refab at no cost; patietn to notified when comes in and make an appoitment if adjust ment is needed.

## 2019-07-18 DIAGNOSIS — D229 Melanocytic nevi, unspecified: Secondary | ICD-10-CM | POA: Diagnosis not present

## 2019-07-18 DIAGNOSIS — L578 Other skin changes due to chronic exposure to nonionizing radiation: Secondary | ICD-10-CM | POA: Diagnosis not present

## 2019-07-18 DIAGNOSIS — L814 Other melanin hyperpigmentation: Secondary | ICD-10-CM | POA: Diagnosis not present

## 2019-07-18 DIAGNOSIS — L719 Rosacea, unspecified: Secondary | ICD-10-CM | POA: Diagnosis not present

## 2019-07-18 DIAGNOSIS — Z1283 Encounter for screening for malignant neoplasm of skin: Secondary | ICD-10-CM | POA: Diagnosis not present

## 2019-07-18 DIAGNOSIS — D225 Melanocytic nevi of trunk: Secondary | ICD-10-CM | POA: Diagnosis not present

## 2019-07-25 DIAGNOSIS — O0993 Supervision of high risk pregnancy, unspecified, third trimester: Secondary | ICD-10-CM | POA: Diagnosis not present

## 2019-08-05 DIAGNOSIS — O26849 Uterine size-date discrepancy, unspecified trimester: Secondary | ICD-10-CM | POA: Diagnosis not present

## 2019-08-20 DIAGNOSIS — E039 Hypothyroidism, unspecified: Secondary | ICD-10-CM | POA: Diagnosis not present

## 2019-08-20 DIAGNOSIS — O99280 Endocrine, nutritional and metabolic diseases complicating pregnancy, unspecified trimester: Secondary | ICD-10-CM | POA: Diagnosis not present

## 2019-08-29 DIAGNOSIS — D225 Melanocytic nevi of trunk: Secondary | ICD-10-CM | POA: Diagnosis not present

## 2019-08-29 DIAGNOSIS — L539 Erythematous condition, unspecified: Secondary | ICD-10-CM | POA: Diagnosis not present

## 2019-08-29 DIAGNOSIS — L719 Rosacea, unspecified: Secondary | ICD-10-CM | POA: Diagnosis not present

## 2019-08-29 DIAGNOSIS — D485 Neoplasm of uncertain behavior of skin: Secondary | ICD-10-CM | POA: Diagnosis not present

## 2019-08-29 DIAGNOSIS — L918 Other hypertrophic disorders of the skin: Secondary | ICD-10-CM | POA: Diagnosis not present

## 2019-09-03 DIAGNOSIS — Z23 Encounter for immunization: Secondary | ICD-10-CM | POA: Diagnosis not present

## 2019-09-19 DIAGNOSIS — O0992 Supervision of high risk pregnancy, unspecified, second trimester: Secondary | ICD-10-CM | POA: Diagnosis not present

## 2019-09-19 DIAGNOSIS — F902 Attention-deficit hyperactivity disorder, combined type: Secondary | ICD-10-CM | POA: Diagnosis not present

## 2019-09-19 LAB — OB RESULTS CONSOLE GC/CHLAMYDIA
Chlamydia: NEGATIVE
Gonorrhea: NEGATIVE

## 2019-09-19 LAB — OB RESULTS CONSOLE HIV ANTIBODY (ROUTINE TESTING): HIV: NONREACTIVE

## 2019-09-19 LAB — OB RESULTS CONSOLE GBS: GBS: NEGATIVE

## 2019-09-19 LAB — OB RESULTS CONSOLE RPR: RPR: NONREACTIVE

## 2019-09-23 DIAGNOSIS — O26843 Uterine size-date discrepancy, third trimester: Secondary | ICD-10-CM | POA: Diagnosis not present

## 2019-10-04 DIAGNOSIS — Z3482 Encounter for supervision of other normal pregnancy, second trimester: Secondary | ICD-10-CM | POA: Diagnosis not present

## 2019-10-04 DIAGNOSIS — Z3483 Encounter for supervision of other normal pregnancy, third trimester: Secondary | ICD-10-CM | POA: Diagnosis not present

## 2019-10-09 ENCOUNTER — Other Ambulatory Visit
Admission: RE | Admit: 2019-10-09 | Discharge: 2019-10-09 | Disposition: A | Discharge status: Home / Self Care | Payer: 59 | Source: Ambulatory Visit | Attending: Obstetrics & Gynecology | Admitting: Obstetrics & Gynecology

## 2019-10-09 ENCOUNTER — Other Ambulatory Visit: Payer: Self-pay

## 2019-10-09 DIAGNOSIS — O4292 Full-term premature rupture of membranes, unspecified as to length of time between rupture and onset of labor: Secondary | ICD-10-CM | POA: Diagnosis not present

## 2019-10-09 DIAGNOSIS — O99284 Endocrine, nutritional and metabolic diseases complicating childbirth: Secondary | ICD-10-CM | POA: Diagnosis not present

## 2019-10-09 DIAGNOSIS — E039 Hypothyroidism, unspecified: Secondary | ICD-10-CM | POA: Diagnosis not present

## 2019-10-09 DIAGNOSIS — D62 Acute posthemorrhagic anemia: Secondary | ICD-10-CM | POA: Diagnosis not present

## 2019-10-09 DIAGNOSIS — O9081 Anemia of the puerperium: Secondary | ICD-10-CM | POA: Diagnosis not present

## 2019-10-09 DIAGNOSIS — Z20822 Contact with and (suspected) exposure to covid-19: Secondary | ICD-10-CM | POA: Diagnosis not present

## 2019-10-09 LAB — SARS CORONAVIRUS 2 (TAT 6-24 HRS): SARS Coronavirus 2: NEGATIVE

## 2019-10-09 NOTE — Plan of Care (Signed)
Pt called and notified that her IOL has been rescheduled from 4/16 at 0001 to 4/17 0001. Pt verbalizes understanding.

## 2019-10-10 ENCOUNTER — Other Ambulatory Visit: Payer: Self-pay | Admitting: Certified Nurse Midwife

## 2019-10-10 NOTE — Progress Notes (Signed)
  History of Present Illness:  Chief Complaint:   HPI:  Christine Adams is a 39 y.o. G2P0 female at [redacted]w[redacted]d dated by 7 week u/s, inconsistent with LMP of 01/02/19.  She presents to L&D for IOL for EFW in the 82nd%ile.    Pregnancy Issues: 1. Pts mother with coagulations issues  Pt is negative 2. Hx LEEP around 2015   5.0 cm cervical length on [place] 1/8 3. Hypothyroidism  Per [facility] up levothyroxine to 158mcg  09/19/2019 - 1.321 4. Prepregnancy BMI 30.5 5. Uterine S>D  09/23/2019 AFI=168MM @63 %, EFW=3468 G @82 %,VTX, ANT PLAC, FHR=138 BPM  [facility]renatal care site: [facility]    EFW: 09/23/2019 AFI=168MM @63 %, EFW=3468 G @82 %,VTX, ANT PLAC, FHR=138 BPM    Pertinent Results:  Prenatal Labs: Blood type/Rh A pos  Antibody screen neg  Rubella Immune  Varicella Immune  RPR NR  HBsAg Neg  HIV NR  GC neg  Chlamydia neg  Genetic screening MaterniT21 and AFP negative  1 hour GTT 125  3 hour GTT   GBS neg     Christine Adams, CNM 10/10/2019 8:07 PM

## 2019-10-11 ENCOUNTER — Other Ambulatory Visit: Payer: Self-pay

## 2019-10-11 ENCOUNTER — Inpatient Hospital Stay
Admission: EM | Admit: 2019-10-11 | Discharge: 2019-10-14 | DRG: 806 | Disposition: A | Discharge status: Home / Self Care | Payer: 59 | Attending: Obstetrics and Gynecology | Admitting: Obstetrics and Gynecology

## 2019-10-11 DIAGNOSIS — Z20822 Contact with and (suspected) exposure to covid-19: Secondary | ICD-10-CM | POA: Diagnosis present

## 2019-10-11 DIAGNOSIS — O4292 Full-term premature rupture of membranes, unspecified as to length of time between rupture and onset of labor: Secondary | ICD-10-CM | POA: Diagnosis present

## 2019-10-11 DIAGNOSIS — O9081 Anemia of the puerperium: Secondary | ICD-10-CM | POA: Diagnosis not present

## 2019-10-11 DIAGNOSIS — Z3A39 39 weeks gestation of pregnancy: Secondary | ICD-10-CM | POA: Diagnosis not present

## 2019-10-11 DIAGNOSIS — O99284 Endocrine, nutritional and metabolic diseases complicating childbirth: Secondary | ICD-10-CM | POA: Diagnosis present

## 2019-10-11 DIAGNOSIS — D62 Acute posthemorrhagic anemia: Secondary | ICD-10-CM | POA: Diagnosis not present

## 2019-10-11 DIAGNOSIS — D72829 Elevated white blood cell count, unspecified: Secondary | ICD-10-CM | POA: Diagnosis not present

## 2019-10-11 DIAGNOSIS — Z3490 Encounter for supervision of normal pregnancy, unspecified, unspecified trimester: Secondary | ICD-10-CM | POA: Diagnosis present

## 2019-10-11 DIAGNOSIS — E039 Hypothyroidism, unspecified: Secondary | ICD-10-CM | POA: Diagnosis present

## 2019-10-11 DIAGNOSIS — O26893 Other specified pregnancy related conditions, third trimester: Secondary | ICD-10-CM | POA: Diagnosis present

## 2019-10-11 LAB — CBC
HCT: 38.3 % (ref 36.0–46.0)
Hemoglobin: 13.3 g/dL (ref 12.0–15.0)
MCH: 29.6 pg (ref 26.0–34.0)
MCHC: 34.7 g/dL (ref 30.0–36.0)
MCV: 85.3 fL (ref 80.0–100.0)
Platelets: 204 10*3/uL (ref 150–400)
RBC: 4.49 MIL/uL (ref 3.87–5.11)
RDW: 13.8 % (ref 11.5–15.5)
WBC: 13.5 10*3/uL — ABNORMAL HIGH (ref 4.0–10.5)
nRBC: 0 % (ref 0.0–0.2)

## 2019-10-11 LAB — TYPE AND SCREEN
ABO/RH(D): A POS
Antibody Screen: NEGATIVE

## 2019-10-11 LAB — ABO/RH: ABO/RH(D): A POS

## 2019-10-11 MED ORDER — LACTATED RINGERS IV SOLN
500.0000 mL | INTRAVENOUS | Status: DC | PRN
  Administered 2019-10-12 (×2): 500 mL via INTRAVENOUS

## 2019-10-11 MED ORDER — MISOPROSTOL 25 MCG QUARTER TABLET
25.0000 ug | ORAL_TABLET | ORAL | Status: DC | PRN
  Filled 2019-10-11: qty 1

## 2019-10-11 MED ORDER — ONDANSETRON HCL 4 MG/2ML IJ SOLN
4.0000 mg | Freq: Four times a day (QID) | INTRAMUSCULAR | Status: DC | PRN
  Administered 2019-10-12 (×2): 4 mg via INTRAVENOUS
  Filled 2019-10-11 (×3): qty 2

## 2019-10-11 MED ORDER — LIDOCAINE HCL (PF) 1 % IJ SOLN
INTRAMUSCULAR | Status: AC
  Filled 2019-10-11: qty 30

## 2019-10-11 MED ORDER — OXYTOCIN 10 UNIT/ML IJ SOLN
INTRAMUSCULAR | Status: AC
  Filled 2019-10-11: qty 2

## 2019-10-11 MED ORDER — OXYCODONE-ACETAMINOPHEN 5-325 MG PO TABS: 2.0000 | ORAL_TABLET | ORAL | Status: DC | PRN

## 2019-10-11 MED ORDER — ACETAMINOPHEN 325 MG PO TABS: 650.0000 mg | ORAL_TABLET | ORAL | Status: DC | PRN

## 2019-10-11 MED ORDER — SOD CITRATE-CITRIC ACID 500-334 MG/5ML PO SOLN: 30.0000 mL | ORAL | Status: DC | PRN

## 2019-10-11 MED ORDER — MISOPROSTOL 25 MCG QUARTER TABLET
25.0000 ug | ORAL_TABLET | ORAL | Status: DC
  Administered 2019-10-11: 16:00:00 25 ug via BUCCAL
  Filled 2019-10-11: qty 1

## 2019-10-11 MED ORDER — OXYTOCIN BOLUS FROM INFUSION
500.0000 mL | Freq: Once | INTRAVENOUS | Status: AC
  Administered 2019-10-12: 16:00:00 500 mL via INTRAVENOUS

## 2019-10-11 MED ORDER — OXYTOCIN 40 UNITS IN NORMAL SALINE INFUSION - SIMPLE MED
2.5000 [IU]/h | INTRAVENOUS | Status: DC
  Administered 2019-10-12: 20:00:00 2.5 [IU]/h via INTRAVENOUS
  Filled 2019-10-11: qty 1000

## 2019-10-11 MED ORDER — TERBUTALINE SULFATE 1 MG/ML IJ SOLN: 0.2500 mg | Freq: Once | INTRAMUSCULAR | Status: DC | PRN

## 2019-10-11 MED ORDER — LIDOCAINE HCL (PF) 1 % IJ SOLN: 30.0000 mL | INTRAMUSCULAR | Status: DC | PRN

## 2019-10-11 MED ORDER — LACTATED RINGERS IV SOLN: INTRAVENOUS | Status: DC

## 2019-10-11 MED ORDER — OXYTOCIN 40 UNITS IN NORMAL SALINE INFUSION - SIMPLE MED
1.0000 m[IU]/min | INTRAVENOUS | Status: DC
  Administered 2019-10-11: 2 m[IU]/min via INTRAVENOUS

## 2019-10-11 MED ORDER — PANTOPRAZOLE SODIUM 40 MG PO TBEC
40.0000 mg | DELAYED_RELEASE_TABLET | Freq: Every day | ORAL | Status: DC
  Administered 2019-10-11 – 2019-10-13 (×3): 40 mg via ORAL
  Filled 2019-10-11 (×4): qty 1

## 2019-10-11 MED ORDER — AMMONIA AROMATIC IN INHA
RESPIRATORY_TRACT | Status: AC
  Filled 2019-10-11: qty 10

## 2019-10-11 MED ORDER — MISOPROSTOL 200 MCG PO TABS
ORAL_TABLET | ORAL | Status: AC
  Administered 2019-10-11: 18:00:00 25 ug via BUCCAL
  Filled 2019-10-11: qty 4

## 2019-10-11 MED ORDER — OXYCODONE-ACETAMINOPHEN 5-325 MG PO TABS: 1.0000 | ORAL_TABLET | ORAL | Status: DC | PRN

## 2019-10-11 NOTE — H&P (Signed)
OB History & Physical   History of Present Illness:  Chief Complaint: scheduled induction and water broke in office today  HPI:  Christine Adams is a 39 y.o. G1P0 female at [redacted]w[redacted]d dated by 7wk [place] and inconsistent with LMP  dating.  She presents to L&D for SROM in office at 11:30am today. Reports active FM, irregular UCs that are not painful. Denies bloody show.      Pregnancy Issues: 1. Pts mother with coagulations issues             Pt is negative 2. Hx LEEP around 2015              5.0 cm cervical length on [place] 1/8 3. Hypothyroidism             Per [facility] up levothyroxine to 122mcg             09/19/2019 - 1.321 4. Prepregnancy BMI 30.5 5. Uterine S>D             09/23/2019 AFI=168MM @63 %, EFW=3468 G @82 %,VTX, ANT PLAC, FHR=138 BPM 6. Advanced maternal age, 39yo    Maternal Medical History:   Past Medical History:  Diagnosis Date  . ADHD   . Hypothyroidism     Past Surgical History:  Procedure Laterality Date  . WISDOM TOOTH EXTRACTION      No Known Allergies  Prior to Admission medications   Medication Sig Start Date End Date Taking? Authorizing Provider  aspirin 81 MG chewable tablet Chew 81 mg by mouth daily.   Yes [provider]  beclomethasone (QVAR) 80 MCG/ACT inhaler Inhale 2 puffs into the lungs daily. 11/27/17  Yes Pleas Koch, NP  levothyroxine (SYNTHROID) 125 MCG tablet TAKE 1 TABLET BY MOUTH ONCE DAILY IN THE MORNING ON AN EMPTY STOMACH WITH A FULL GLASS OF WATER 04/26/19  Yes Ria Bush, MD  ADDERALL XR 15 MG 24 hr capsule Take 15 mg by mouth daily.  05/12/16   [provider]  albuterol (PROVENTIL HFA;VENTOLIN HFA) 108 (90 Base) MCG/ACT inhaler Inhale into the lungs every 6 (six) hours as needed.    [provider]  ALPRAZolam Duanne Moron) 0.25 MG tablet Take 0.25 mg by mouth as needed for anxiety.    [provider]  Azelaic Acid 15 % cream  11/13/18   [provider]  cetirizine (ZYRTEC) 10 MG tablet Take 10  mg by mouth daily.    [provider]  cholecalciferol (VITAMIN D) 1000 units tablet Take 1,000 Units by mouth daily.    [provider]  Multiple Vitamin (MULTIVITAMIN) tablet Take 1 tablet by mouth daily.    [provider]  Omeprazole Magnesium (PRILOSEC OTC PO) Take 20 mg by mouth daily.    [provider]  valACYclovir (VALTREX) 500 MG tablet  12/25/18   [provider]     Prenatal care site: [place]  Social History: She  reports that she has never smoked. She has never used smokeless tobacco. She reports current alcohol use.  Family History: family history includes ADD / ADHD in her sister; Alcohol abuse in her paternal grandmother; Clotting disorder in her mother; Diabetes in her maternal grandmother; Hyperlipidemia in her father; Hypertension in her father and paternal grandfather; Mesothelioma in her maternal grandmother; Sudden Cardiac Death in her father.   Review of Systems: A full review of systems was performed and negative except as noted in the HPI.     Physical Exam:  Vital Signs: BP  139/78   Pulse 94   Temp 98.1 F (36.7 C) (Oral)   Resp 18   Ht 5\' 6"  (1.676 m)   Wt 99.3 kg   LMP 01/02/2019   BMI 35.35 kg/m  General: no acute distress.  HEENT: normocephalic, atraumatic Heart: regular rate & rhythm.  No murmurs/rubs/gallops Lungs: clear to auscultation bilaterally, normal respiratory effort Abdomen: soft, gravid, non-tender;  EFW: 8# Pelvic:    External: Normal external female genitalia  Cervix: Dilation: 2 / Effacement (%): 50 / Station: -1    Extremities: non-tender, symmetric, slight LE edema bilaterally.  DTRs: 2+  Neurologic: Alert & oriented x 3.    Results for orders placed or performed during the hospital encounter of 10/11/19 (from the past 24 hour(s))  CBC     Status: Abnormal   Collection Time: 10/11/19  2:22 PM  Result Value Ref Range   WBC 13.5 (H) 4.0 - 10.5 K/uL   RBC 4.49 3.87 -  5.11 MIL/uL   Hemoglobin 13.3 12.0 - 15.0 g/dL   HCT 38.3 36.0 - 46.0 %   MCV 85.3 80.0 - 100.0 fL   MCH 29.6 26.0 - 34.0 pg   MCHC 34.7 30.0 - 36.0 g/dL   RDW 13.8 11.5 - 15.5 %   Platelets 204 150 - 400 K/uL   nRBC 0.0 0.0 - 0.2 %  Type and screen     Status: None   Collection Time: 10/11/19  2:22 PM  Result Value Ref Range   ABO/RH(D) A POS    Antibody Screen NEG    Sample Expiration      10/14/2019,2359 Performed at [facility], [address] [place], [place] [address]   ABO/Rh     Status: None   Collection Time: 10/11/19  5:07 PM  Result Value Ref Range   ABO/RH(D)      A POS Performed at [facility], [address], [place], [place] [address]     Pertinent Results:  Prenatal Labs: Blood type/Rh A Pos  Antibody screen neg  Rubella Immune  Varicella Immune  RPR NR  HBsAg Neg  HIV NR  GC neg  Chlamydia neg  Genetic screening  MaterniT21 negative, AFP neg  1 hour GTT  125  GBS  neg   FHT: 125bpm, mod variability, + accels, no decels TOCO: q4-25min, palpate mild SVE:  Dilation: 2 / Effacement (%): 50 / Station: -1 soft/midposition   Cephalic by leopolds/SVE  No results found.  Assessment:  [name] is a 39 y.o. G1P0 female at [redacted]w[redacted]d with IOL at term for PROM, AMA, .   Plan:  1. Admit to Labor & Delivery; consents reviewed and obtained - COVID neg - PROM, will ripen with cytotec and plan for Pitocin - TJS aware of admission.    2. Fetal Well being  - Fetal Tracing: Cat I tracing - Group B Streptococcus ppx indicated: negative - Presentation: cephalic confirmed by exam/leopolds   3. Routine OB: - Prenatal labs reviewed, as above - Rh A pos - CBC, T&S, RPR on admit - Clear fluids, saline lock  4. Induction of Labor -  Contractions: external toco in place -  Pelvis adequate for TOL, unproven -  Plan for induction with cytotec, then consider pitocin when adequate bishops score -  Plan for continuous fetal monitoring  -   Maternal pain control as desired; plans regional anesthesia - Anticipate vaginal delivery  5. Post Partum Planning: - Infant feeding: breast - Contraception: TBD  Francetta Found, CNM 10/11/19 6:38 PM

## 2019-10-12 ENCOUNTER — Encounter: Payer: Self-pay | Admitting: Obstetrics and Gynecology

## 2019-10-12 ENCOUNTER — Inpatient Hospital Stay: Payer: 59 | Admitting: Anesthesiology

## 2019-10-12 LAB — RPR: RPR Ser Ql: NONREACTIVE

## 2019-10-12 LAB — PROTEIN / CREATININE RATIO, URINE
Creatinine, Urine: 45 mg/dL
Protein Creatinine Ratio: 0.64 mg/mg{creat} — ABNORMAL HIGH (ref 0.00–0.15)
Total Protein, Urine: 29 mg/dL

## 2019-10-12 MED ORDER — COCONUT OIL OIL
1.0000 "application " | TOPICAL_OIL | Status: DC | PRN
  Administered 2019-10-12: 1 via TOPICAL
  Filled 2019-10-12: qty 120

## 2019-10-12 MED ORDER — ONDANSETRON HCL 4 MG PO TABS: 4.0000 mg | ORAL_TABLET | ORAL | Status: DC | PRN

## 2019-10-12 MED ORDER — DIPHENHYDRAMINE HCL 50 MG/ML IJ SOLN: 12.5000 mg | INTRAMUSCULAR | Status: DC | PRN

## 2019-10-12 MED ORDER — BENZOCAINE-MENTHOL 20-0.5 % EX AERO
1.0000 "application " | INHALATION_SPRAY | CUTANEOUS | Status: DC | PRN
  Administered 2019-10-12: 1 via TOPICAL
  Filled 2019-10-12 (×2): qty 56

## 2019-10-12 MED ORDER — WITCH HAZEL-GLYCERIN EX PADS
1.0000 "application " | MEDICATED_PAD | CUTANEOUS | Status: DC | PRN
  Administered 2019-10-12: 1 via TOPICAL
  Filled 2019-10-12 (×2): qty 100

## 2019-10-12 MED ORDER — SENNOSIDES-DOCUSATE SODIUM 8.6-50 MG PO TABS
2.0000 | ORAL_TABLET | ORAL | Status: DC
  Administered 2019-10-12 – 2019-10-13 (×2): 2 via ORAL
  Filled 2019-10-12 (×2): qty 2

## 2019-10-12 MED ORDER — ZOLPIDEM TARTRATE 5 MG PO TABS: 5.0000 mg | ORAL_TABLET | Freq: Every evening | ORAL | Status: DC | PRN

## 2019-10-12 MED ORDER — LIDOCAINE-EPINEPHRINE (PF) 1.5 %-1:200000 IJ SOLN
INTRAMUSCULAR | Status: DC | PRN
  Administered 2019-10-12: 3 mL via EPIDURAL

## 2019-10-12 MED ORDER — FENTANYL 2.5 MCG/ML W/ROPIVACAINE 0.15% IN NS 100 ML EPIDURAL (ARMC)
EPIDURAL | Status: AC
  Filled 2019-10-12: qty 100

## 2019-10-12 MED ORDER — LACTATED RINGERS IV SOLN
500.0000 mL | Freq: Once | INTRAVENOUS | Status: AC
  Administered 2019-10-12: 500 mL via INTRAVENOUS

## 2019-10-12 MED ORDER — IBUPROFEN 600 MG PO TABS
600.0000 mg | ORAL_TABLET | Freq: Four times a day (QID) | ORAL | Status: DC
  Administered 2019-10-12 – 2019-10-14 (×7): 600 mg via ORAL
  Filled 2019-10-12 (×7): qty 1

## 2019-10-12 MED ORDER — FENTANYL 2.5 MCG/ML W/ROPIVACAINE 0.15% IN NS 100 ML EPIDURAL (ARMC)
12.0000 mL/h | EPIDURAL | Status: DC
  Administered 2019-10-12 (×3): 12 mL/h via EPIDURAL
  Filled 2019-10-12 (×2): qty 100

## 2019-10-12 MED ORDER — DIBUCAINE (PERIANAL) 1 % EX OINT
1.0000 "application " | TOPICAL_OINTMENT | CUTANEOUS | Status: DC | PRN
  Administered 2019-10-12: 1 via RECTAL
  Filled 2019-10-12 (×2): qty 28

## 2019-10-12 MED ORDER — LEVOTHYROXINE SODIUM 125 MCG PO TABS
125.0000 ug | ORAL_TABLET | Freq: Every day | ORAL | Status: DC
  Filled 2019-10-12 (×2): qty 1

## 2019-10-12 MED ORDER — EPHEDRINE 5 MG/ML INJ: 10.0000 mg | INTRAVENOUS | Status: DC | PRN

## 2019-10-12 MED ORDER — PHENYLEPHRINE 40 MCG/ML (10ML) SYRINGE FOR IV PUSH (FOR BLOOD PRESSURE SUPPORT): 80.0000 ug | PREFILLED_SYRINGE | INTRAVENOUS | Status: DC | PRN

## 2019-10-12 MED ORDER — ONDANSETRON HCL 4 MG/2ML IJ SOLN: 4.0000 mg | INTRAMUSCULAR | Status: DC | PRN

## 2019-10-12 MED ORDER — ACETAMINOPHEN 325 MG PO TABS
650.0000 mg | ORAL_TABLET | ORAL | Status: DC | PRN
  Administered 2019-10-12 – 2019-10-14 (×8): 650 mg via ORAL
  Filled 2019-10-12 (×8): qty 2

## 2019-10-12 MED ORDER — LACTATED RINGERS AMNIOINFUSION
INTRAVENOUS | Status: DC
  Filled 2019-10-12 (×5): qty 1000

## 2019-10-12 MED ORDER — DIPHENHYDRAMINE HCL 25 MG PO CAPS: 25.0000 mg | ORAL_CAPSULE | Freq: Four times a day (QID) | ORAL | Status: DC | PRN

## 2019-10-12 MED ORDER — LIDOCAINE HCL (PF) 1 % IJ SOLN
INTRAMUSCULAR | Status: DC | PRN
  Administered 2019-10-12: 3 mL

## 2019-10-12 MED ORDER — FENTANYL 2.5 MCG/ML W/ROPIVACAINE 0.15% IN NS 100 ML EPIDURAL (ARMC)
EPIDURAL | Status: DC | PRN
  Administered 2019-10-12: 12 mL/h via EPIDURAL

## 2019-10-12 MED ORDER — SIMETHICONE 80 MG PO CHEW: 80.0000 mg | CHEWABLE_TABLET | ORAL | Status: DC | PRN

## 2019-10-12 MED ORDER — ONDANSETRON HCL 4 MG/2ML IJ SOLN
4.0000 mg | INTRAMUSCULAR | Status: DC | PRN
  Administered 2019-10-12: 4 mg via INTRAVENOUS

## 2019-10-12 MED ORDER — SODIUM CHLORIDE 0.9 % IV SOLN
INTRAVENOUS | Status: DC | PRN
  Administered 2019-10-12: 8 mL via EPIDURAL
  Administered 2019-10-12 (×2): 5 mL via EPIDURAL

## 2019-10-12 MED ORDER — PRENATAL MULTIVITAMIN CH
1.0000 | ORAL_TABLET | Freq: Every day | ORAL | Status: DC
  Administered 2019-10-13: 12:00:00 1 via ORAL
  Filled 2019-10-12: qty 1

## 2019-10-12 NOTE — Progress Notes (Signed)
Labor Progress Note  Christine Adams is a 39 y.o. G1P0 at [redacted]w[redacted]d by ultrasound admitted for PROM  Subjective: increasing pain- has used PCEA multiple times.   Objective: BP (!) 154/92 (BP Location: Right Arm)   Pulse 84   Temp 98.4 F (36.9 C) (Oral)   Resp 17   Ht 5\' 6"  (1.676 m)   Wt 99.3 kg   LMP 01/02/2019   SpO2 98%   BMI 35.35 kg/m  Notable VS details: reviewed, remains afebrile, no sx chorio. Elevated BPs noted mild range.   Fetal Assessment: FHT:  130bpm, mod variability, 10*10 accels, variables deepening and recurrent.  Category/reactivity:  Category II UC:   regular, every 2-3 minutes, pitocin at 18mu/min SVE:   7/C/0, some molding noted, position feels to be asynclitic. More cervix noted on maternal right side. IUPC placed for amnioinfusion due to recurrent variable decels.    Membrane status: PROM at 1130 on 10/11/19 Amniotic color: clear  Labs: Lab Results  Component Value Date   WBC 13.5 (H) 10/11/2019   HGB 13.3 10/11/2019   HCT 38.3 10/11/2019   MCV 85.3 10/11/2019   PLT 204 10/11/2019    Assessment / Plan: Active labor, G1 at 39.3wks with PROM x 24hrs  Labor: s/p ripening with cytotec, pitocin.  IUPC placed for amnioinufsion, reviewed Cat II tracing with BEB, cervical change now in active labor.  Preeclampsia:  no e/o pre-e, mild range BPs intermittently and pt remains uncomfortable. Will monitor closely and send urine P/C from foley cath.  Fetal Wellbeing:  Cat II- will continue to monitor closely. Pain Control:  Epidural I/D:  GBS neg Anticipated MOD:  NSVD  Christine Adams, CNM 10/12/2019, 11:22 AM

## 2019-10-12 NOTE — Progress Notes (Signed)
Labor Progress Note  Christine Adams is a 39 y.o. G1P0 at [redacted]w[redacted]d by ultrasound admitted for PROM  Subjective: increasing pain- has used PCEA multiple times. Pt still feeling discomfort worse on left side, has had a bolus from anesthesia, feels sharp left hip and back pain.   Objective: BP (!) 154/92 (BP Location: Right Arm)   Pulse 84   Temp 98.4 F (36.9 C) (Oral)   Resp 17   Ht 5\' 6"  (1.676 m)   Wt 99.3 kg   LMP 01/02/2019   SpO2 97%   BMI 35.35 kg/m  Notable VS details: reviewed, remains afebrile, no sx chorio. Elevated BPs noted mild range, last BP 139/88.    - Maternal positioning to assist with rotation and descent, Walchers position with 5 UCs, then left side lying release then right side lying release for 3 UCs each side. CNM remained at bedside for approx 65min.    Fetal Assessment: FHT:  130bpm, mod variability, no accels, variables- FHR  To 90s, lasting 30-60sec, occurring with UCs.  Category/reactivity:  Category II UC:   regular, every 2-3 minutes, pitocin at 58mu/min SVE:   9/C/0, molding noted.  Anterior lip noted.    Membrane status: PROM at 1130 on 10/11/19 Amniotic color: clear  Labs: Lab Results  Component Value Date   WBC 13.5 (H) 10/11/2019   HGB 13.3 10/11/2019   HCT 38.3 10/11/2019   MCV 85.3 10/11/2019   PLT 204 10/11/2019    Assessment / Plan: Active labor, G1 at 39.3wks with PROM x 24hrs  Labor: s/p ripening with cytotec, pitocin.  IUPC for amnioinufsion, reviewed Cat II tracing, continues to have cervical change.  Preeclampsia:  no e/o pre-e, mild range BPs intermittently and pt remains uncomfortable. Will monitor closely, urine P/C from foley cath in process.  Fetal Wellbeing:  Cat II- will continue to monitor closely. Pain Control:  Epidural I/D:  GBS neg Anticipated MOD:  NSVD  Francetta Found, CNM 10/12/2019, 12:15 PM

## 2019-10-12 NOTE — Progress Notes (Signed)
Labor Progress Note  Lariyah Glaspy is a 39 y.o. G1P0 at [redacted]w[redacted]d by ultrasound admitted for PROM  Subjective: comfortable after epidural.   Objective: BP 118/77 (BP Location: Right Arm)   Pulse 90   Temp 98 F (36.7 C) (Oral)   Resp 17   Ht 5\' 6"  (1.676 m)   Wt 99.3 kg   LMP 01/02/2019   SpO2 97%   BMI 35.35 kg/m  Notable VS details: reviewed, remains afebrile, no sx chorio.   Fetal Assessment: FHT:  FHR: 130 bpm, variability: moderate,  accelerations:  Present,  decelerations:  Present early and variable decels intermittently.   - reviewed tracing overnight, periods of variable decels noted, interventions per nrusing documentation. Overall reassuring.  Category/reactivity:  Category II UC:   regular, every 2-3 minutes, pitocin at 19mu/min SVE:   4/C/-1, some molding noted, position feels to be asynclitic.   Membrane status: PROM at 1130 on 10/11/19 Amniotic color: clear  Labs: Lab Results  Component Value Date   WBC 13.5 (H) 10/11/2019   HGB 13.3 10/11/2019   HCT 38.3 10/11/2019   MCV 85.3 10/11/2019   PLT 204 10/11/2019    Assessment / Plan: Protracted latent phase, PROM x 20hrs  Labor: s/p ripening with cytotec, pitocin. If no cervical change after 4hrs will place IUPC or if variables become more persistent, will consider amnioinfusion. Position changes to facilitate fetal rotation and descent.  Preeclampsia:  no e/o pre-e Fetal Wellbeing:  Category I Pain Control:  Epidural I/D:  GBS neg Anticipated MOD:  NSVD  Murray Hodgkins Bomani Oommen, CNM 10/12/2019, 7:44 AM

## 2019-10-12 NOTE — Discharge Summary (Signed)
Obstetrical Discharge Summary  Patient Name: Christine Adams DOB: [birth year] MRN: [id number]  Date of Admission: 10/11/2019 Date of Delivery: 10/12/19 Delivered by: Christine Adams CNM Date of Discharge: 10/14/2019  Primary OB: Christine Adams  [id number] last menstrual period was 01/02/2019. EDC Estimated Date of Delivery: 10/16/19 Gestational Age at Delivery: [redacted]w[redacted]d   Antepartum complications:  1.Pts mother with coagulations issues Pt is negative 2. Hx LEEP around 2015  5.0 cm cervical length on [place] 1/8 3. Hypothyroidism Per BEB up levothyroxine to 181mcg 09/19/2019 - 1.321 4. Prepregnancy BMI 30.5 5. Uterine S>D 09/23/2019 AFI=168MM @63 %, EFW=3468 G @82 %,VTX, ANT PLAC, FHR=138 BPM 6. Advanced maternal age, 39yo   Admitting Diagnosis: PROM, 49wks, advanced maternal age Secondary Diagnosis: SVD, nuchal cord, 2nd deg lac Patient Active Problem List   Diagnosis Date Noted  . Encounter for induction of labor 10/11/2019  . Rosacea 12/10/2018  . Asthma 11/27/2017  . Preventative health care 12/08/2016  . Hypothyroidism 05/25/2016  . Attention deficit hyperactivity disorder (ADHD) 05/25/2016  . Sleep walking disorder 01/22/2015  . Dysplasia of cervix, low grade (CIN 1) 11/25/2013  . Pap smear abnormality of cervix/human papillomavirus (HPV) positive 11/25/2013  . Sleep disorder, circadian, shift work type 06/07/2013  . Chronic cough 02/27/2013  . Anxiety 01/21/2013  . Seasonal allergies 01/21/2013    Augmentation: Pitocin and Cytotec Complications: 123XX123 hours Intrapartum complications/course: PROM >24hrs, cat II tracing with variables, see delivery notes.  Date of Delivery: 10/12/19 Delivered By: Christine Adams CNM Delivery Type: spontaneous vaginal delivery Anesthesia: epidural Placenta: spontaneous Laceration: 2nd deg perineal Episiotomy: none Newborn Data: Live born female  Birth Weight: 7 lb 9.7 oz (3450  g) APGAR: 5, 9  Newborn Delivery   Birth date/time: 10/12/2019 15:59:00 Delivery type: Vaginal, Spontaneous      Postpartum Procedures: none  Edinburgh:  Edinburgh Postnatal Depression Scale Screening Tool 10/13/2019 10/13/2019 10/12/2019  I have been able to laugh and see the funny side of things. 0 (No Data) (No Data)  I have looked forward with enjoyment to things. 0 - -  I have blamed myself unnecessarily when things went wrong. 1 - -  I have been anxious or worried for no good reason. 2 - -  I have felt scared or panicky for no good reason. 0 - -  Things have been getting on top of me. 0 - -  I have been so unhappy that I have had difficulty sleeping. 0 - -  I have felt sad or miserable. 0 - -  I have been so unhappy that I have been crying. 0 - -  The thought of harming myself has occurred to me. 0 - -  Edinburgh Postnatal Depression Scale Total 3 - -      Post partum course:  Patient had an uncomplicated postpartum course.  By time of discharge on PPD#2, her pain was controlled on oral pain medications; she had appropriate lochia and was ambulating, voiding without difficulty and tolerating regular diet.  She was deemed stable for discharge to home.    Discharge Physical Exam:  BP 118/80   Pulse 76   Temp 97.9 F (36.6 C) (Oral)   Resp 18   Ht 5\' 6"  (1.676 m)   Wt 99.3 kg   LMP 01/02/2019   SpO2 96%   Breastfeeding Unknown   BMI 35.35 kg/m   General: NAD CV: RRR Pulm: CTABL, nl effort ABD: s/nd/nt, fundus firm and below the umbilicus Lochia: moderate DVT Evaluation: LE non-ttp, no evidence of  DVT on exam.  Hemoglobin  Date Value Ref Range Status  10/14/2019 9.7 (L) 12.0 - 15.0 g/dL Final   HCT  Date Value Ref Range Status  10/14/2019 28.3 (L) 36.0 - 46.0 % Final     Disposition: stable, discharge to home. Baby Feeding: breastmilk Baby Disposition: home with mom  Rh Immune globulin given: n/Adams Rubella vaccine given: immune Varicella vaccine given:  immune Tdap vaccine given in AP or PP setting: 09/03/19 Flu vaccine given in AP or PP setting: 03/2019  Contraception: TBD  Prenatal Labs:  Blood type/Rh Adams Pos  Antibody screen neg  Rubella Immune  Varicella Immune  RPR NR  HBsAg Neg  HIV NR  GC neg  Chlamydia neg  Genetic screening  MaterniT21 negative, AFP neg  1 hour GTT  125  GBS  neg      Plan:  Christine Adams was discharged to home in good condition. Follow-up appointment with delivering provider in 6 weeks.  Discharge Medications: Allergies as of 10/14/2019   No Known Allergies     Medication List    TAKE these medications   Adderall XR 15 MG 24 hr capsule Generic drug: amphetamine-dextroamphetamine Take 15 mg by mouth daily.   albuterol 108 (90 Base) MCG/ACT inhaler Commonly known as: VENTOLIN HFA Inhale into the lungs every 6 (six) hours as needed.   ALPRAZolam 0.25 MG tablet Commonly known as: XANAX Take 0.25 mg by mouth as needed for anxiety.   aspirin 81 MG chewable tablet Chew 81 mg by mouth daily.   Azelaic Acid 15 % cream   beclomethasone 80 MCG/ACT inhaler Commonly known as: QVAR Inhale 2 puffs into the lungs daily.   cetirizine 10 MG tablet Commonly known as: ZYRTEC Take 10 mg by mouth daily.   cholecalciferol 1000 units tablet Commonly known as: VITAMIN D Take 1,000 Units by mouth daily.   ibuprofen 600 MG tablet Commonly known as: ADVIL Take 1 tablet (600 mg total) by mouth every 6 (six) hours as needed for mild pain, moderate pain or cramping.   levothyroxine 125 MCG tablet Commonly known as: SYNTHROID TAKE 1 TABLET BY MOUTH ONCE DAILY IN THE MORNING ON AN EMPTY STOMACH WITH Adams FULL GLASS OF WATER   multivitamin tablet Take 1 tablet by mouth daily.   PRILOSEC OTC PO Take 20 mg by mouth daily.   valACYclovir 500 MG tablet Commonly known as: VALTREX       Follow-up Information    Christine Adams, CNM Follow up in 6 week(s).   Specialty: Obstetrics and Gynecology Why:  routine PP Contact information: [address] [address] [phone]           Signed: Lisette Adams, CNM 10/14/2019  7:58 AM

## 2019-10-12 NOTE — Anesthesia Preprocedure Evaluation (Signed)
Anesthesia Evaluation  Patient identified by MRN, date of birth, ID band Patient awake    Reviewed: Allergy & Precautions, NPO status , Patient's Chart, lab work & pertinent test results  History of Anesthesia Complications Negative for: history of anesthetic complications  Airway Mallampati: II  TM Distance: >3 FB Neck ROM: Full    Dental no notable dental hx. (+) Teeth Intact   Pulmonary neg pulmonary ROS, neg sleep apnea, neg COPD, Patient abstained from smoking.Not current smoker,    Pulmonary exam normal breath sounds clear to auscultation       Cardiovascular Exercise Tolerance: Good METS(-) hypertension(-) CAD and (-) Past MI negative cardio ROS  (-) dysrhythmias  Rhythm:Regular Rate:Normal - Systolic murmurs    Neuro/Psych PSYCHIATRIC DISORDERS Anxiety negative neurological ROS     GI/Hepatic neg GERD  ,(+)     (-) substance abuse  ,   Endo/Other  neg diabetesHypothyroidism   Renal/GU negative Renal ROS     Musculoskeletal   Abdominal   Peds  Hematology   Anesthesia Other Findings Past Medical History: No date: ADHD No date: Hypothyroidism  Reproductive/Obstetrics (+) Pregnancy                             Anesthesia Physical Anesthesia Plan  ASA: II  Anesthesia Plan: Epidural   Post-op Pain Management:    Induction:   PONV Risk Score and Plan: 3 and Treatment may vary due to age or medical condition and Ondansetron  Airway Management Planned: Natural Airway  Additional Equipment:   Intra-op Plan:   Post-operative Plan:   Informed Consent: I have reviewed the patients History and Physical, chart, labs and discussed the procedure including the risks, benefits and alternatives for the proposed anesthesia with the patient or authorized representative who has indicated his/her understanding and acceptance.       Plan Discussed with: Surgeon  Anesthesia Plan  Comments: (Discussed R/B/A of neuraxial anesthesia technique with patient: - rare risks of spinal/epidural hematoma, nerve damage, infection - Risk of PDPH - Risk of itching - Risk of nausea and vomiting - Risk of poor block necessitating replacement of epidural. Patient voiced understanding.)        Anesthesia Quick Evaluation

## 2019-10-12 NOTE — Anesthesia Procedure Notes (Signed)
Epidural Patient location during procedure: OB Start time: 10/12/2019 12:15 AM End time: 10/12/2019 12:37 AM  Staffing Anesthesiologist: Arita Miss, MD Performed: anesthesiologist   Preanesthetic Checklist Completed: patient identified, IV checked, site marked, risks and benefits discussed, surgical consent, monitors and equipment checked, pre-op evaluation and timeout performed  Epidural Patient position: sitting Prep: ChloraPrep Patient monitoring: heart rate, continuous pulse ox and blood pressure Approach: midline Location: L3-L4 Injection technique: LOR saline  Needle:  Needle type: Tuohy  Needle gauge: 17 G Needle length: 9 cm and 9 Needle insertion depth: 6 cm Catheter type: closed end flexible Catheter size: 19 Gauge Catheter at skin depth: 11 cm Test dose: negative and 1.5% lidocaine with Epi 1:200 K  Assessment Sensory level: T10 Events: blood not aspirated, injection not painful, no injection resistance, no paresthesia and negative IV test  Additional Notes 1st attempt Pt. Evaluated and documentation done after procedure finished. Patient identified. Risks/Benefits/Options discussed with patient including but not limited to bleeding, infection, nerve damage, paralysis, failed block, incomplete pain control, headache, blood pressure changes, nausea, vomiting, reactions to medication both or allergic, itching and postpartum back pain. Confirmed with bedside nurse the patient's most recent platelet count. Confirmed with patient that they are not currently taking any anticoagulation, have any bleeding history or any family history of bleeding disorders. Patient expressed understanding and wished to proceed. All questions were answered. Sterile technique was used throughout the entire procedure. Please see nursing notes for vital signs. Test dose was given through epidural catheter and negative prior to continuing to dose epidural or start infusion. Warning signs of high  block given to the patient including shortness of breath, tingling/numbness in hands, complete motor block, or any concerning symptoms with instructions to call for help. Patient was given instructions on fall risk and not to get out of bed. All questions and concerns addressed with instructions to call with any issues or inadequate analgesia.   Patient tolerated the insertion well without immediate complications.Reason for block:procedure for pain

## 2019-10-13 LAB — CBC
HCT: 34.6 % — ABNORMAL LOW (ref 36.0–46.0)
Hemoglobin: 11.8 g/dL — ABNORMAL LOW (ref 12.0–15.0)
MCH: 29.8 pg (ref 26.0–34.0)
MCHC: 34.1 g/dL (ref 30.0–36.0)
MCV: 87.4 fL (ref 80.0–100.0)
Platelets: 169 10*3/uL (ref 150–400)
RBC: 3.96 MIL/uL (ref 3.87–5.11)
RDW: 14 % (ref 11.5–15.5)
WBC: 19.6 10*3/uL — ABNORMAL HIGH (ref 4.0–10.5)
nRBC: 0 % (ref 0.0–0.2)

## 2019-10-13 MED ORDER — POLYETHYLENE GLYCOL 3350 17 G PO PACK
17.0000 g | PACK | Freq: Once | ORAL | Status: AC
  Administered 2019-10-13: 17 g via ORAL
  Filled 2019-10-13: qty 1

## 2019-10-13 MED ORDER — LEVOTHYROXINE SODIUM 50 MCG PO TABS
125.0000 ug | ORAL_TABLET | Freq: Every day | ORAL | Status: DC
  Administered 2019-10-13 – 2019-10-14 (×2): 125 ug via ORAL
  Filled 2019-10-13 (×2): qty 2.5

## 2019-10-13 NOTE — Lactation Note (Signed)
This note was copied from a baby's chart. Lactation Consultation Note  Patient Name: Christine Adams [id number] Date: 10/13/2019 Reason for consult: Follow-up assessment;Mother's request;Difficult latch;Primapara;Term;Other (Comment)(Slight receding chin & lip tie)  [name] was still sleeping from the circumcision.  Gently aroused when bowel movement and circumcision dressing removed.  He started putting his hands to his mouth which were pointed out to parents.  Mom's nipples are tender, slightly red and not well everted.  He has a slight lip tie and receding chin.  Attempted to put him to the breast at first without nipple shield.  He would latch, but kept sucking in lower lip causing him to lose the seal on the breast after a few sucks.  Once #20 nipple shield was placed, he kept his tongue down, lips flanged and was able to sustain the latch.  Demonstrated how to massage breast and gently stimulate him to keep him actively sucking at the breast.  He was on the breast for 40 minutes with 15 minutes of productive, nutritive sucking.  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  She has already received her Medela DEBP through her [employer].  She is not planning to go back to work for 3 months.  Discussed when and how to introduce a bottle and begin pumping.  Handout given and reviewed on normal newborn stomach size, supply and demand, normal course of lactation and routine newborn feeding patterns.  Lactation Geophysicist/field seismologist given with contact numbers and reviewed encouraging to call with any questions, concerns or assistance.   Maternal Data Formula Feeding for Exclusion: No Has patient been taught Hand Expression?: Yes Does the patient have breastfeeding experience prior to this delivery?: No(Gr1)  Feeding Feeding Type: Breast Fed  LATCH Score Latch: Repeated attempts needed to sustain latch, nipple held in mouth throughout feeding, stimulation needed to elicit sucking  reflex.  Audible Swallowing: A few with stimulation  Type of Nipple: Flat(Everts when compressed into nipple shield)  Comfort (Breast/Nipple): Filling, red/small blisters or bruises, mild/mod discomfort  Hold (Positioning): Assistance needed to correctly position infant at breast and maintain latch.  LATCH Score: 5  Interventions Interventions: Assisted with latch;Skin to skin;Breast massage;Reverse pressure;Breast compression;Adjust position;Support pillows;Position options;Coconut oil;Comfort gels  Lactation Tools Discussed/Used Tools: Coconut oil;Comfort gels;Nipple Shields Nipple shield size: 20;24(#20 NS stays on better) WIC Program: No([facility] Ins.  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

## 2019-10-13 NOTE — Progress Notes (Signed)
Post Partum Day 1 Subjective: Doing well, no complaints.  Tolerating regular diet, pain with PO meds, voiding and ambulating without difficulty.  No CP SOB Fever,Chills, N/V or leg pain; denies nipple or breast pain; no HA change of vision, RUQ/epigastric pain  Objective: BP 126/86 (BP Location: Left Arm)   Pulse 85   Temp 98.5 F (36.9 C) (Oral)   Resp 20   Ht 5\' 6"  (1.676 m)   Wt 99.3 kg   LMP 01/02/2019   SpO2 99%   Breastfeeding Unknown   BMI 35.35 kg/m    Physical Exam:  General: NAD Breasts: soft/nontender CV: RRR Pulm: nl effort, CTABL Abdomen: soft, NT, BS x 4 Perineum: minimal edema, laceration repair well approximated Lochia: small Uterine Fundus: fundus firm and 2 fb below umbilicus DVT Evaluation: no cords, ttp LEs   Recent Labs    10/11/19 1422 10/13/19 0624  HGB 13.3 11.8*  HCT 38.3 34.6*  WBC 13.5* 19.6*  PLT 204 169    Assessment/Plan: 39 y.o. G1P1001 postpartum day # 2  - Continue routine PP care - Lactation consult - Acute blood loss anemia - hemodynamically stable and asymptomatic; start po ferrous sulfate BID with stool softeners  - elevated WBCs- afebrile, will monitor closely, if pt remains inpt for day 2, will repeat CBC.  - Immunization status: all Imms up to date   Disposition: Does not desire Dc home today.     Francetta Found, CNM 10/13/2019  9:33 AM

## 2019-10-14 ENCOUNTER — Ambulatory Visit: Payer: Self-pay

## 2019-10-14 LAB — CBC WITH DIFFERENTIAL/PLATELET
Abs Immature Granulocytes: 0.14 10*3/uL — ABNORMAL HIGH (ref 0.00–0.07)
Basophils Absolute: 0.1 10*3/uL (ref 0.0–0.1)
Basophils Relative: 1 %
Eosinophils Absolute: 0.2 10*3/uL (ref 0.0–0.5)
Eosinophils Relative: 2 %
HCT: 28.3 % — ABNORMAL LOW (ref 36.0–46.0)
Hemoglobin: 9.7 g/dL — ABNORMAL LOW (ref 12.0–15.0)
Immature Granulocytes: 1 %
Lymphocytes Relative: 19 %
Lymphs Abs: 2.5 10*3/uL (ref 0.7–4.0)
MCH: 30 pg (ref 26.0–34.0)
MCHC: 34.3 g/dL (ref 30.0–36.0)
MCV: 87.6 fL (ref 80.0–100.0)
Monocytes Absolute: 0.6 10*3/uL (ref 0.1–1.0)
Monocytes Relative: 5 %
Neutro Abs: 9.2 10*3/uL — ABNORMAL HIGH (ref 1.7–7.7)
Neutrophils Relative %: 72 %
Platelets: 172 10*3/uL (ref 150–400)
RBC: 3.23 MIL/uL — ABNORMAL LOW (ref 3.87–5.11)
RDW: 14.4 % (ref 11.5–15.5)
WBC: 12.8 10*3/uL — ABNORMAL HIGH (ref 4.0–10.5)
nRBC: 0 % (ref 0.0–0.2)

## 2019-10-14 MED ORDER — IBUPROFEN 600 MG PO TABS: 600.0000 mg | ORAL_TABLET | Freq: Four times a day (QID) | ORAL | 0 refills | Status: DC | PRN

## 2019-10-14 NOTE — Anesthesia Postprocedure Evaluation (Signed)
Anesthesia Post Note  Patient: Christine Adams  Procedure(s) Performed: AN AD [place]  Patient location during evaluation: Mother Baby Anesthesia Type: Epidural Level of consciousness: awake and alert Pain management: pain level controlled Vital Signs Assessment: post-procedure vital signs reviewed and stable Respiratory status: spontaneous breathing, nonlabored ventilation and respiratory function stable Cardiovascular status: stable Postop Assessment: no headache, no backache and epidural receding Anesthetic complications: no     Last Vitals:  Vitals:   10/13/19 2350 10/14/19 0817  BP: 118/80 138/90  Pulse: 76 79  Resp: 18 20  Temp: 36.6 C 36.9 C  SpO2: 96% 99%    Last Pain:  Vitals:   10/14/19 0817  TempSrc: Oral  PainSc:                  Estill Batten

## 2019-10-14 NOTE — Progress Notes (Signed)
Pt discharged with infant.  Discharge instructions, prescriptions and follow up appointment given to and reviewed with pt. Pt verbalized understanding. Escorted out by auxillary. 

## 2019-10-14 NOTE — Lactation Note (Signed)
This note was copied from a baby's chart. Lactation Consultation Note  Patient Name: Christine Adams [id number] Date: 10/14/2019 Reason for consult: Follow-up assessment;Mother's request;Primapara;Term;Hyperbilirubinemia;Other (Comment)(Slight receding chin, lip tie and tongue tie)  Christine Adams was sleeping soundly when entered room.  Once FOB started changing void and stool, he started putting his hands to his mouth with sucking motion.  Christine Adams has a slight receding chin, lip tie and tongue tie which was discussed with mom and Pediatrician when he was circumcised yesterday.  Mom's mother is an OT and her father is a family dentist.  Numbers for area pediatric dentists that can assess and treat if needed were given to mom.   Symphony pump had been set up in room for stimulation to bring mature milk in sooner since Christine Adams is jaundiced with a 9.7 bilirubin at 36 hours which puts him at high intermediate risk zone.  Instructed in breast massage, hand expression, pumping, collection, storage, cleaning, labeling and handling of expressed milk.  We pre pumped for about 5 minutes while FOB changed pamper and got a few drops.  Christine Adams went to the breast eagerly, but kept sucking in lower lip causing him to lose suction at the breast.  Demonstrated to parents how to gently touch chin to flange lower lip outward.  Mom can feel when he sucks his lower lip inward because it causes her quite a bit of discomfort.  Once we placed #24 nipple shield, he started sucking vigorously with good rhythmic sucks and occasional swallows keeping his tongue down and lips flanged.  With nipple shield, he was able to sustain the latch for 30 minutes.  Frequent stimulation and breast massage were needed to keep him actively sucking the entire 30 minutes.  Mom's nipples are slightly red and tender to touch. No break in the nipple tissue was noted.  Milk was noted in the nipple shield when he came off the breast.  Demonstrated how to rub  colostrum on nipples to prevent bacteria, lubricate and help with healing and discomfort.  Coconut oil and comfort gels have been given and instructed in rotating use.  Mom and baby are to be discharged today.  Encouraged mom to call with any questions, concerns or if needed out patient consult.     Maternal Data Formula Feeding for Exclusion: No Has patient been taught Hand Expression?: Yes Does the patient have breastfeeding experience prior to this delivery?: No(Gr1)  Feeding Feeding Type: Breast Fed  LATCH Score Latch: Repeated attempts needed to sustain latch, nipple held in mouth throughout feeding, stimulation needed to elicit sucking reflex.  Audible Swallowing: A few with stimulation  Type of Nipple: Flat(Can evert with compression and nipple shield)  Comfort (Breast/Nipple): Filling, red/small blisters or bruises, mild/mod discomfort  Hold (Positioning): Assistance needed to correctly position infant at breast and maintain latch.(Mom can do on her own with NS - needs help to flange lip    )  LATCH Score: 5  Interventions Interventions: Assisted with latch;Skin to skin;Breast massage;Hand express;Pre-pump if needed;Reverse pressure;Breast compression;Adjust position;Support pillows;Position options;Coconut oil;Comfort gels;Hand pump;DEBP  Lactation Tools Discussed/Used Tools: Pump;Coconut oil;Comfort gels;Nipple [place] Fuel;Other (comment)(Curved tip syringe given and demonstrated if needed) Nipple shield size: 24 Breast pump type: Double-Electric Breast Pump WIC Program: No Pump Review: Setup, frequency, and cleaning;Milk Storage;Other (comment) Initiated by:: Christine Tejada,RN,BSN,IBCLC Date initiated:: 10/14/19   Consult Status Consult Status: PRN Follow-up type: Call as needed    Jarold Motto 10/14/2019, 12:44 PM

## 2019-10-14 NOTE — Discharge Instructions (Signed)
After Your Delivery Discharge Instructions   Postpartum: Care Instructions  After childbirth (postpartum period), your body goes through many changes. Some of these changes happen over several weeks. In the hours after delivery, your body will begin to recover from childbirth while it prepares to breastfeed your newborn. You may feel emotional during this time. Your hormones can shift your mood without warning for no clear reason.  In the first couple of weeks after childbirth, many women have emotions that change from happy to sad. You may find it hard to sleep. You may cry a lot. This is called the "baby blues." These overwhelming emotions often go away within a couple of days or weeks. But it's important to discuss your feelings with your doctor.  You should call your care provider if you have unrelieved feelings of:  Inability to cope  Sadness  Anxiety  Lack of interest in baby  Insomnia  Crying  It is easy to get too tired and overwhelmed during the first weeks after childbirth. Don't try to do too much. Get rest whenever you can, accept help from others, and eat well and drink plenty of fluids.  About 4 to 6 weeks after your baby's birth, you will have a follow-up visit with your care provider. This visit is your time to talk to your provider about anything you are concerned or curious about.  Follow-up care is a key part of your treatment and safety. Be sure to make and go to all appointments, and call your doctor if you are having problems. It's also a good idea to know your test results and keep a list of the medicines you take.  How can you care for yourself at home?  Sleep or rest when your baby sleeps.  Get help with household chores from family or friends, if you can. Do not try to do it all yourself.  If you have hemorrhoids or swelling or pain around the opening of your vagina, try using cold and heat. You can put ice or a cold pack on the area for 10 to 20 minutes at  a time. Put a thin cloth between the ice and your skin. Also try sitting in a few inches of warm water (sitz bath) 3 times a day and after bowel movements.  Take pain medicines exactly as directed.  If the provider gave you a prescription medicine for pain, take it as prescribed.  If you do not have a prescription and need something over the counter, you can take:  Ibuprofen (Motrin, Advil) up to 600mg every 6 hours as needed for pain  Acetaminophen (Tylenol) up to 650mg every 4 hours as needed for pain  Some people find it helpful to alternate between these two medications.   No driving for 1-2 weeks or while taking pain medications.   Eat more fiber to avoid constipation. Include foods such as whole-grain breads and cereals, raw vegetables, raw and dried fruits, and beans.  Drink plenty of fluids, enough so that your urine is light yellow or clear like water. If you have kidney, heart, or liver disease and have to limit fluids, talk with your doctor before you increase the amount of fluids you drink.  Do not put anything in the vagina for 6 weeks. This means no sex, no tampons, no douching, and no enemas.  If you have stitches, keep the area clean by pouring or spraying warm water over the area outside your vagina and anus after you use the toilet.    No strenuous activity or heavy lifting for 6 weeks   No tub baths; showers only  Continue prenatal vitamin and iron.  If breastfeeding:  Increase calories and fluids while breastfeeding.  You may have a slight fever when your milk comes in, but it should go away on its own. If it does not, and rises above 101.0 please call the doctor.  For breastfeeding concerns, the lactation consultant can be reached at [phone].  For concerns about your baby, please call your pediatrician.   Keep a list of questions to bring to your postpartum visit. Your questions might be about:  Changes in your breasts, such as lumps or  soreness.  When to expect your menstrual period to start again.  What form of birth control is best for you.  Weight you have put on during the pregnancy.  Exercise options.  What foods and drinks are best for you, especially if you are breastfeeding.  Problems you might be having with breastfeeding.  When you can have sex. Some women may want to talk about lubricants for the vagina.  Any feelings of sadness or restlessness that you are having.   When should you call for help?  Call 911 anytime you think you may need emergency care. For example, call if:  You have thoughts of harming yourself, your baby, or another person.  You passed out (lost consciousness).  Call the office at [phone] or seek immediate medical care if:  If you have heavy bleeding such that you are soaking 1 pad in an hour for 2 hours  You are dizzy or lightheaded, or you feel like you may faint.  You have a fever; a temperature of 101.0 F or greater  Chills  Difficulty urinating  Headache unrelieved by "pain meds"   Visual changes  Pain in the right side of your belly near your ribs  Breasts reddened, hard, hot to the touch or any other breast concerns  Nipple discharge which is foul-smelling or contains pus   Increased pain at the site of the laceration   New pain unrelieved with recommended over-the-counter dosages  Difficulty breathing with or without chest pain   New leg pain, swelling, or redness, especially if it is only on one leg  Any other concerns  Watch closely for changes in your health, and be sure to contact your provider if:  You have new or worse vaginal discharge.  You feel sad or depressed.  You are having problems with your breasts or breastfeeding.

## 2019-11-12 DIAGNOSIS — Z3483 Encounter for supervision of other normal pregnancy, third trimester: Secondary | ICD-10-CM | POA: Diagnosis not present

## 2019-11-12 DIAGNOSIS — Z3482 Encounter for supervision of other normal pregnancy, second trimester: Secondary | ICD-10-CM | POA: Diagnosis not present

## 2019-11-13 DIAGNOSIS — F902 Attention-deficit hyperactivity disorder, combined type: Secondary | ICD-10-CM | POA: Diagnosis not present

## 2019-11-21 DIAGNOSIS — Z1332 Encounter for screening for maternal depression: Secondary | ICD-10-CM | POA: Diagnosis not present

## 2019-11-21 DIAGNOSIS — E039 Hypothyroidism, unspecified: Secondary | ICD-10-CM | POA: Diagnosis not present

## 2019-11-28 DIAGNOSIS — M62838 Other muscle spasm: Secondary | ICD-10-CM | POA: Diagnosis not present

## 2019-12-12 DIAGNOSIS — Z3482 Encounter for supervision of other normal pregnancy, second trimester: Secondary | ICD-10-CM | POA: Diagnosis not present

## 2019-12-12 DIAGNOSIS — Z3483 Encounter for supervision of other normal pregnancy, third trimester: Secondary | ICD-10-CM | POA: Diagnosis not present

## 2019-12-24 DIAGNOSIS — Z113 Encounter for screening for infections with a predominantly sexual mode of transmission: Secondary | ICD-10-CM | POA: Diagnosis not present

## 2019-12-24 DIAGNOSIS — Z3202 Encounter for pregnancy test, result negative: Secondary | ICD-10-CM | POA: Diagnosis not present

## 2019-12-24 DIAGNOSIS — Z30430 Encounter for insertion of intrauterine contraceptive device: Secondary | ICD-10-CM | POA: Diagnosis not present

## 2020-01-09 DIAGNOSIS — F4322 Adjustment disorder with anxiety: Secondary | ICD-10-CM | POA: Diagnosis not present

## 2020-01-09 DIAGNOSIS — F902 Attention-deficit hyperactivity disorder, combined type: Secondary | ICD-10-CM | POA: Diagnosis not present

## 2020-01-17 DIAGNOSIS — Z3482 Encounter for supervision of other normal pregnancy, second trimester: Secondary | ICD-10-CM | POA: Diagnosis not present

## 2020-01-17 DIAGNOSIS — Z3483 Encounter for supervision of other normal pregnancy, third trimester: Secondary | ICD-10-CM | POA: Diagnosis not present

## 2020-01-22 ENCOUNTER — Other Ambulatory Visit: Payer: Self-pay | Admitting: Dentistry

## 2020-01-27 ENCOUNTER — Ambulatory Visit (INDEPENDENT_AMBULATORY_CARE_PROVIDER_SITE_OTHER)
Admission: RE | Admit: 2020-01-27 | Discharge: 2020-01-27 | Disposition: A | Discharge status: Home / Self Care | Payer: 59 | Source: Ambulatory Visit

## 2020-01-27 DIAGNOSIS — J029 Acute pharyngitis, unspecified: Secondary | ICD-10-CM

## 2020-01-27 MED ORDER — AMOXICILLIN 500 MG PO CAPS: 1000.0000 mg | ORAL_CAPSULE | Freq: Every day | ORAL | 0 refills | Status: AC

## 2020-01-27 NOTE — ED Provider Notes (Signed)
Virtual Visit via Video Note:  Christine Adams  initiated request for Telemedicine visit with [facility] team. I connected with Christine Adams  on 01/28/2020 at 10:07 AM  for a synchronized telemedicine visit using a video enabled HIPPA compliant telemedicine application. I verified that I am speaking with Christine Adams  using two identifiers. Christine July, NP  was physically located in a [facility]gent care site and Christine Adams was located at a different location.   The limitations of evaluation and management by telemedicine as well as the availability of in-person appointments were discussed. Patient was informed that she  may incur a bill ( including co-pay) for this virtual visit encounter. Christine Adams  expressed understanding and gave verbal consent to proceed with virtual visit.     History of Present Illness:Christine Adams  is a 39 y.o. female presents with sore throat. Started with URI and then the sore throat started a few days ago and worse. Pain with swallowing.  Fever. Taking allergy meds and OTC meds without relief. Hx of strep. Strep exposure   Past Medical History:  Diagnosis Date  . ADHD   . Hypothyroidism     No Known Allergies      Observations/Objective:VITALS: Per patient if applicable, see vitals. GENERAL: Alert, appears well and in no acute distress. HEENT: Atraumatic, conjunctiva clear, no obvious abnormalities on inspection of external nose and ears. NECK: Normal movements of the head and neck. CARDIOPULMONARY: No increased WOB. Speaking in clear sentences. I:E ratio WNL.  MS: Moves all visible extremities without noticeable abnormality. PSYCH: Pleasant and cooperative, well-groomed. Speech normal rate and rhythm. Affect is appropriate. Insight and judgement are appropriate. Attention is focused, linear, and appropriate.  NEURO: CN grossly intact. Oriented as arrived to appointment on time with no prompting. Moves both UE equally.  SKIN: No obvious  lesions, wounds, erythema, or cyanosis noted on face or hands.     Assessment and Plan: sore throat- treating for strep based on hx, symptoms and exposure.  Treating amoxicillin. Over-the-counter meds as needed for symptoms.   Follow Up Instructions: Follow up as needed for continued or worsening symptoms     I discussed the assessment and treatment plan with the patient. The patient was provided an opportunity to ask questions and all were answered. The patient agreed with the plan and demonstrated an understanding of the instructions.   The patient was advised to call back or seek an in-person evaluation if the symptoms worsen or if the condition fails to improve as anticipated.    Christine July, NP  01/28/2020 10:07 AM         Christine July, NP 01/28/20 1007

## 2020-01-27 NOTE — Discharge Instructions (Addendum)
Medication as prescribed OTC meds for symptoms  Follow up as needed for continued or worsening symptoms

## 2020-01-28 ENCOUNTER — Encounter: Payer: 59 | Admitting: Primary Care

## 2020-01-28 DIAGNOSIS — Z03818 Encounter for observation for suspected exposure to other biological agents ruled out: Secondary | ICD-10-CM | POA: Diagnosis not present

## 2020-01-28 DIAGNOSIS — R509 Fever, unspecified: Secondary | ICD-10-CM | POA: Diagnosis not present

## 2020-01-28 DIAGNOSIS — R05 Cough: Secondary | ICD-10-CM | POA: Diagnosis not present

## 2020-02-04 DIAGNOSIS — F4322 Adjustment disorder with anxiety: Secondary | ICD-10-CM | POA: Diagnosis not present

## 2020-02-04 DIAGNOSIS — F902 Attention-deficit hyperactivity disorder, combined type: Secondary | ICD-10-CM | POA: Diagnosis not present

## 2020-02-14 ENCOUNTER — Encounter: Payer: Self-pay | Admitting: Primary Care

## 2020-02-14 ENCOUNTER — Ambulatory Visit (INDEPENDENT_AMBULATORY_CARE_PROVIDER_SITE_OTHER): Payer: 59 | Admitting: Primary Care

## 2020-02-14 ENCOUNTER — Other Ambulatory Visit: Payer: Self-pay

## 2020-02-14 VITALS — BP 116/74 | HR 76 | Temp 96.5°F | Ht 66.0 in | Wt 193.2 lb

## 2020-02-14 DIAGNOSIS — Z1159 Encounter for screening for other viral diseases: Secondary | ICD-10-CM | POA: Diagnosis not present

## 2020-02-14 DIAGNOSIS — F419 Anxiety disorder, unspecified: Secondary | ICD-10-CM

## 2020-02-14 DIAGNOSIS — Z0000 Encounter for general adult medical examination without abnormal findings: Secondary | ICD-10-CM

## 2020-02-14 DIAGNOSIS — E039 Hypothyroidism, unspecified: Secondary | ICD-10-CM

## 2020-02-14 DIAGNOSIS — J4520 Mild intermittent asthma, uncomplicated: Secondary | ICD-10-CM

## 2020-02-14 DIAGNOSIS — F909 Attention-deficit hyperactivity disorder, unspecified type: Secondary | ICD-10-CM

## 2020-02-14 LAB — COMPREHENSIVE METABOLIC PANEL
ALT: 18 U/L (ref 0–35)
AST: 18 U/L (ref 0–37)
Albumin: 4.3 g/dL (ref 3.5–5.2)
Alkaline Phosphatase: 104 U/L (ref 39–117)
BUN: 14 mg/dL (ref 6–23)
CO2: 29 mEq/L (ref 19–32)
Calcium: 9.4 mg/dL (ref 8.4–10.5)
Chloride: 107 mEq/L (ref 96–112)
Creatinine, Ser: 0.86 mg/dL (ref 0.40–1.20)
GFR: 73.84 mL/min (ref >60.00)
Glucose, Bld: 85 mg/dL (ref 70–99)
Potassium: 3.9 mEq/L (ref 3.5–5.1)
Sodium: 141 mEq/L (ref 135–145)
Total Bilirubin: 0.5 mg/dL (ref 0.2–1.2)
Total Protein: 7.2 g/dL (ref 6.0–8.3)

## 2020-02-14 LAB — CBC
HCT: 41.1 % (ref 36.0–46.0)
Hemoglobin: 14 g/dL (ref 12.0–15.0)
MCHC: 34 g/dL (ref 30.0–36.0)
MCV: 86.1 fl (ref 78.0–100.0)
Platelets: 260 10*3/uL (ref 150.0–400.0)
RBC: 4.78 Mil/uL (ref 3.87–5.11)
RDW: 13.7 % (ref 11.5–15.5)
WBC: 7.6 10*3/uL (ref 4.0–10.5)

## 2020-02-14 LAB — LIPID PANEL
Cholesterol: 162 mg/dL (ref 0–200)
HDL: 32.6 mg/dL — ABNORMAL LOW (ref >39.00)
NonHDL: 129.1
Total CHOL/HDL Ratio: 5
Triglycerides: 234 mg/dL — ABNORMAL HIGH (ref 0.0–149.0)
VLDL: 46.8 mg/dL — ABNORMAL HIGH (ref 0.0–40.0)

## 2020-02-14 LAB — TSH: TSH: 22.57 u[IU]/mL — ABNORMAL HIGH (ref 0.35–4.50)

## 2020-02-14 LAB — LDL CHOLESTEROL, DIRECT: Direct LDL: 105 mg/dL

## 2020-02-14 MED ORDER — LEVOTHYROXINE SODIUM 125 MCG PO TABS: ORAL_TABLET | ORAL | 0 refills | Status: DC

## 2020-02-14 NOTE — Assessment & Plan Note (Signed)
Lungs clear on exam, no recent need for SABA. Continue to monitor.

## 2020-02-14 NOTE — Assessment & Plan Note (Addendum)
Off of levothyroxine for 2 weeks. Last TSH of 0.38 in May 2021, she is also post partum since March 2021, and was on 150 mcg at the time.  Repeat TSH pending. Refills provided for levothyroxine 125 mcg as 150 mcg was likely too much.   Will likely need to repeat TSH in 2 months. Await results.

## 2020-02-14 NOTE — Assessment & Plan Note (Signed)
Currently off of ADHD medication as she is breast feeding. Follows with psychiatry.

## 2020-02-14 NOTE — Patient Instructions (Addendum)
Stop by the lab prior to leaving today. I will notify you of your results once received.   Be sure to take your levothyroxine (thyroid medication) every morning on an empty stomach with water only. No food or other medications for 30 minutes. No heartburn medication, iron pills, calcium, vitamin D, or magnesium pills within four hours of taking levothyroxine.   Continue to work on exercise. Continue to work on a healthy diet.  Ensure you are consuming 64 ounces of water daily.  It was a pleasure to see you today!   Preventive Care 12-44 Years Old, Female Preventive care refers to visits with your health care provider and lifestyle choices that can promote health and wellness. This includes:  A yearly physical exam. This may also be called an annual well check.  Regular dental visits and eye exams.  Immunizations.  Screening for certain conditions.  Healthy lifestyle choices, such as eating a healthy diet, getting regular exercise, not using drugs or products that contain nicotine and tobacco, and limiting alcohol use. What can I expect for my preventive care visit? Physical exam Your health care provider will check your:  Height and weight. This may be used to calculate body mass index (BMI), which tells if you are at a healthy weight.  Heart rate and blood pressure.  Skin for abnormal spots. Counseling Your health care provider may ask you questions about your:  Alcohol, tobacco, and drug use.  Emotional well-being.  Home and relationship well-being.  Sexual activity.  Eating habits.  Work and work Statistician.  Method of birth control.  Menstrual cycle.  Pregnancy history. What immunizations do I need?  Influenza (flu) vaccine  This is recommended every year. Tetanus, diphtheria, and pertussis (Tdap) vaccine  You may need a Td booster every 10 years. Varicella (chickenpox) vaccine  You may need this if you have not been vaccinated. Human papillomavirus  (HPV) vaccine  If recommended by your health care provider, you may need three doses over 6 months. Measles, mumps, and rubella (MMR) vaccine  You may need at least one dose of MMR. You may also need a second dose. Meningococcal conjugate (MenACWY) vaccine  One dose is recommended if you are age 45-21 years and a first-year college student living in a residence hall, or if you have one of several medical conditions. You may also need additional booster doses. Pneumococcal conjugate (PCV13) vaccine  You may need this if you have certain conditions and were not previously vaccinated. Pneumococcal polysaccharide (PPSV23) vaccine  You may need one or two doses if you smoke cigarettes or if you have certain conditions. Hepatitis A vaccine  You may need this if you have certain conditions or if you travel or work in places where you may be exposed to hepatitis A. Hepatitis B vaccine  You may need this if you have certain conditions or if you travel or work in places where you may be exposed to hepatitis B. Haemophilus influenzae type b (Hib) vaccine  You may need this if you have certain conditions. You may receive vaccines as individual doses or as more than one vaccine together in one shot (combination vaccines). Talk with your health care provider about the risks and benefits of combination vaccines. What tests do I need?  Blood tests  Lipid and cholesterol levels. These may be checked every 5 years starting at age 45.  Hepatitis C test.  Hepatitis B test. Screening  Diabetes screening. This is done by checking your blood sugar (glucose)  after you have not eaten for a while (fasting).  Sexually transmitted disease (STD) testing.  BRCA-related cancer screening. This may be done if you have a family history of breast, ovarian, tubal, or peritoneal cancers.  Pelvic exam and Pap test. This may be done every 3 years starting at age 56. Starting at age 48, this may be done every 5  years if you have a Pap test in combination with an HPV test. Talk with your health care provider about your test results, treatment options, and if necessary, the need for more tests. Follow these instructions at home: Eating and drinking   Eat a diet that includes fresh fruits and vegetables, whole grains, lean protein, and low-fat dairy.  Take vitamin and mineral supplements as recommended by your health care provider.  Do not drink alcohol if: ? Your health care provider tells you not to drink. ? You are pregnant, may be pregnant, or are planning to become pregnant.  If you drink alcohol: ? Limit how much you have to 0-1 drink a day. ? Be aware of how much alcohol is in your drink. In the [place] one drink equals one 12 oz bottle of beer (355 mL), one 5 oz glass of wine (148 mL), or one 1 oz glass of hard liquor (44 mL). Lifestyle  Take daily care of your teeth and gums.  Stay active. Exercise for at least 30 minutes on 5 or more days each week.  Do not use any products that contain nicotine or tobacco, such as cigarettes, e-cigarettes, and chewing tobacco. If you need help quitting, ask your health care provider.  If you are sexually active, practice safe sex. Use a condom or other form of birth control (contraception) in order to prevent pregnancy and STIs (sexually transmitted infections). If you plan to become pregnant, see your health care provider for a preconception visit. What's next?  Visit your health care provider once a year for a well check visit.  Ask your health care provider how often you should have your eyes and teeth checked.  Stay up to date on all vaccines. This information is not intended to replace advice given to you by your health care provider. Make sure you discuss any questions you have with your health care provider. Document Revised: 02/22/2018 Document Reviewed: 02/22/2018 Elsevier Patient Education  2020 [employer].

## 2020-02-14 NOTE — Assessment & Plan Note (Signed)
Improved with sertraline. Continue same.

## 2020-02-14 NOTE — Progress Notes (Signed)
Subjective:    Patient ID: Christine Adams, female    DOB: 1981/01/15, 39 y.o.   MRN: [id number]  HPI  This visit occurred during the SARS-CoV-2 public health emergency.  Safety protocols were in place, including screening questions prior to the visit, additional usage of staff PPE, and extensive cleaning of exam room while observing appropriate contact time as indicated for disinfecting solutions.   Christine Adams is a 39 year old female who presents today for complete physical.  Immunizations: -Tetanus: Completed in 2021 -Influenza: Due this season  -Covid-19: Completed series  Diet: She endorses a fair diet.  Exercise: Walking often, plans on resuming riding lessons.   Eye exam: Completes annually  Dental exam: Completes semi-annually   Pap Smear: Completed in 2019  BP Readings from Last 3 Encounters:  02/14/20 116/74  10/14/19 138/90  12/10/18 124/82   Wt Readings from Last 3 Encounters:  02/14/20 193 lb 4 oz (87.7 kg)  10/11/19 219 lb (99.3 kg)  12/10/18 183 lb 4 oz (83.1 kg)     Review of Systems  Constitutional: Negative for unexpected weight change.  HENT: Negative for rhinorrhea.   Eyes: Negative for visual disturbance.  Respiratory: Negative for cough and shortness of breath.   Cardiovascular: Negative for chest pain.  Gastrointestinal: Negative for constipation and diarrhea.  Genitourinary: Negative for difficulty urinating.  Musculoskeletal: Negative for arthralgias and myalgias.  Skin: Negative for rash.  Allergic/Immunologic: Negative for environmental allergies.  Neurological: Negative for dizziness, numbness and headaches.  Psychiatric/Behavioral: The patient is not nervous/anxious.        Improved on Zoloft       Past Medical History:  Diagnosis Date   ADHD    Hypothyroidism      Social History   Socioeconomic History   Marital status: Married    Spouse name: Not on file   Number of children: Not on file   Years of education: Not on file    Highest education level: Not on file  Occupational History   Not on file  Tobacco Use   Smoking status: Never Smoker   Smokeless tobacco: Never Used  Substance and Sexual Activity   Alcohol use: Yes    Comment: occasional   Drug use: Never   Sexual activity: Yes    Partners: Male  Other Topics Concern   Not on file  Social History Narrative   Single.   No children.   In school for Nurse Practitioner.   Enjoys riding horses, spending outdoors.   Social Determinants of Health   Financial Resource Strain:    Difficulty of Paying Living Expenses: Not on file  Food Insecurity:    Worried About Charity fundraiser in the Last Year: Not on file   [employer] of Food in the Last Year: Not on file  Transportation Needs:    Lack of Transportation (Medical): Not on file   Lack of Transportation (Non-Medical): Not on file  Physical Activity:    Days of Exercise per Week: Not on file   Minutes of Exercise per Session: Not on file  Stress:    Feeling of Stress : Not on file  Social Connections:    Frequency of Communication with Friends and Family: Not on file   Frequency of Social Gatherings with Friends and Family: Not on file   Attends Religious Services: Not on file   Active Member of Clubs or Organizations: Not on file   Attends Archivist Meetings: Not on file  Marital Status: Not on file  Intimate Partner Violence:    Fear of Current or Ex-Partner: Not on file   Emotionally Abused: Not on file   Physically Abused: Not on file   Sexually Abused: Not on file    Past Surgical History:  Procedure Laterality Date   WISDOM TOOTH EXTRACTION      Family History  Problem Relation Age of Onset   Clotting disorder Mother    Sudden Cardiac Death Father    Hypertension Father    Hyperlipidemia Father    ADD / ADHD Sister    Diabetes Maternal Grandmother    Mesothelioma Maternal Grandmother    Alcohol abuse Paternal Grandmother      Hypertension Paternal Grandfather     No Known Allergies  Current Outpatient Medications on File Prior to Visit  Medication Sig Dispense Refill   cholecalciferol (VITAMIN D) 1000 units tablet Take 1,000 Units by mouth daily.     levothyroxine (SYNTHROID) 125 MCG tablet TAKE 1 TABLET BY MOUTH ONCE DAILY IN THE MORNING ON AN EMPTY STOMACH WITH A FULL GLASS OF WATER 90 tablet 2   Multiple Vitamin (MULTIVITAMIN) tablet Take 1 tablet by mouth daily.     Omeprazole Magnesium (PRILOSEC OTC PO) Take 20 mg by mouth daily.     sertraline (ZOLOFT) 50 MG tablet Take by mouth.     ADDERALL XR 15 MG 24 hr capsule Take 15 mg by mouth daily.  (Patient not taking: Reported on 02/14/2020)  0   albuterol (PROVENTIL HFA;VENTOLIN HFA) 108 (90 Base) MCG/ACT inhaler Inhale into the lungs every 6 (six) hours as needed. (Patient not taking: Reported on 02/14/2020)     ALPRAZolam (XANAX) 0.25 MG tablet Take 0.25 mg by mouth as needed for anxiety. (Patient not taking: Reported on 02/14/2020)     Azelaic Acid 15 % cream  (Patient not taking: Reported on 02/14/2020)     beclomethasone (QVAR) 80 MCG/ACT inhaler Inhale 2 puffs into the lungs daily. (Patient not taking: Reported on 02/14/2020) 1 Inhaler 5   cetirizine (ZYRTEC) 10 MG tablet Take 10 mg by mouth daily. (Patient not taking: Reported on 02/14/2020)     ibuprofen (ADVIL) 600 MG tablet Take 1 tablet (600 mg total) by mouth every 6 (six) hours as needed for mild pain, moderate pain or cramping. (Patient not taking: Reported on 02/14/2020) 60 tablet 0   valACYclovir (VALTREX) 500 MG tablet  (Patient not taking: Reported on 02/14/2020)     No current facility-administered medications on file prior to visit.    BP 116/74    Pulse 76    Temp (!) 96.5 F (35.8 C) (Temporal)    Ht 5\' 6"  (1.676 m)    Wt 193 lb 4 oz (87.7 kg)    SpO2 98%    Breastfeeding Yes    BMI 31.19 kg/m    Objective:   Physical Exam HENT:     Right Ear: Tympanic membrane and ear  canal normal.     Left Ear: Tympanic membrane and ear canal normal.  Eyes:     Pupils: Pupils are equal, round, and reactive to light.  Cardiovascular:     Rate and Rhythm: Normal rate and regular rhythm.  Pulmonary:     Effort: Pulmonary effort is normal.     Breath sounds: Normal breath sounds.  Abdominal:     General: Bowel sounds are normal.     Palpations: Abdomen is soft.     Tenderness: There is no abdominal tenderness.  Musculoskeletal:        General: Normal range of motion.     Cervical back: Neck supple.  Skin:    General: Skin is warm and dry.  Neurological:     Mental Status: She is alert and oriented to person, place, and time.     Cranial Nerves: No cranial nerve deficit.     Deep Tendon Reflexes:     Reflex Scores:      Patellar reflexes are 2+ on the right side and 2+ on the left side. Psychiatric:        Mood and Affect: Mood normal.            Assessment & Plan:

## 2020-02-14 NOTE — Assessment & Plan Note (Signed)
Immunizations UTD. Pap smear UTD, follows with GYN. Encouraged a healthy diet, regular exercise. Exam today unremarkable. Labs pending.

## 2020-02-17 LAB — HEPATITIS C ANTIBODY
Hepatitis C Ab: NONREACTIVE
SIGNAL TO CUT-OFF: 0.01 (ref <1.00)

## 2020-02-21 DIAGNOSIS — Z3483 Encounter for supervision of other normal pregnancy, third trimester: Secondary | ICD-10-CM | POA: Diagnosis not present

## 2020-02-21 DIAGNOSIS — Z3482 Encounter for supervision of other normal pregnancy, second trimester: Secondary | ICD-10-CM | POA: Diagnosis not present

## 2020-03-05 DIAGNOSIS — Z03818 Encounter for observation for suspected exposure to other biological agents ruled out: Secondary | ICD-10-CM | POA: Diagnosis not present

## 2020-03-05 DIAGNOSIS — Z1152 Encounter for screening for COVID-19: Secondary | ICD-10-CM | POA: Diagnosis not present

## 2020-03-31 ENCOUNTER — Other Ambulatory Visit: Payer: Self-pay | Admitting: Psychiatry

## 2020-03-31 DIAGNOSIS — F4322 Adjustment disorder with anxiety: Secondary | ICD-10-CM | POA: Diagnosis not present

## 2020-03-31 DIAGNOSIS — F902 Attention-deficit hyperactivity disorder, combined type: Secondary | ICD-10-CM | POA: Diagnosis not present

## 2020-04-14 ENCOUNTER — Other Ambulatory Visit: Payer: 59

## 2020-04-14 ENCOUNTER — Telehealth: Payer: Self-pay | Admitting: Primary Care

## 2020-04-14 DIAGNOSIS — B3789 Other sites of candidiasis: Secondary | ICD-10-CM

## 2020-04-14 MED ORDER — FLUCONAZOLE 200 MG PO TABS: ORAL_TABLET | ORAL | 0 refills | Status: DC

## 2020-04-14 NOTE — Telephone Encounter (Signed)
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  She wants to know if you can call something in or suggest something for her or does she need to make a virtual visit?  Please advise.  Thank you!

## 2020-04-14 NOTE — Telephone Encounter (Signed)
Spoke with patient via phone, she has a history of nipple yeast infections from nursing.  Her most recent episode began a few days ago and is progressing.  During her last episode she was treated with topical agents, then had to move to oral fluconazole for a total of 24 days.  Symptoms are early now, and she has failed topical treatment thus far, we will send in a prescription for fluconazole 400 mg x 1 day, then 200 mg daily for a total of 14 days.  She verbalized understanding and will update if no improvement.

## 2020-04-17 ENCOUNTER — Other Ambulatory Visit: Payer: Self-pay | Admitting: Primary Care

## 2020-04-17 ENCOUNTER — Other Ambulatory Visit: Payer: Self-pay

## 2020-04-17 ENCOUNTER — Other Ambulatory Visit (INDEPENDENT_AMBULATORY_CARE_PROVIDER_SITE_OTHER): Payer: 59

## 2020-04-17 DIAGNOSIS — E039 Hypothyroidism, unspecified: Secondary | ICD-10-CM | POA: Diagnosis not present

## 2020-04-17 LAB — TSH: TSH: 2.75 u[IU]/mL (ref 0.35–4.50)

## 2020-05-13 ENCOUNTER — Ambulatory Visit: Payer: Self-pay

## 2020-05-13 NOTE — Lactation Note (Signed)
This note was copied from a baby's chart. Lactation Consultation Note  Patient Name: Ginnifer Creelman Today's Date: 05/13/2020     Maternal Data   Mom is P1, delivered 7 months ago, works full time, and is fully BF [place]. A nipple shield was given post delivery due to suspected tongue/lie restriction and sore/damaged nipples. Mom did follow-up with pediatric dentist who indicated that release was not an option due to [facility] snoring while he sleeps; this may cause airway restriction. Mom's left breast is the higher producer and faster flow, so [name] prefers this breast. At 6 months mom attempted feedings without shield and [name] accepted the breast easily, however mom felt pain and discomfort, redness/soreness and inflammation immediately. She developed a fissure, and some bleeding that has now healed, but the tenderness and soreness, and red inflammation has remained. She and [name] have both been treated for thrush, but no change in feeling. 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  Feeding    Oral assessment conducted prior to feed: suspected lip and tongue restriction visible. Becket was put to the breast in office, easily grasped the breast as mom tensed, and cried out in discomfort, LC pulled down to flange bottom lip and work to flange top lip, Once lips were flanged Beckett popped on/off the breast, and nipple was compressed.  LC attempted to assist with sandwiching of the breast tissue to aid in deeper latch, continued pain reported by mom.  Size 23mm NS placed on breast, Beckett easily accepted, and sustained latch for audible swallows before becoming distracted and coming off. Mom notes improvement in feeling. NS is continued to be recommended due to suspected restriction that [name] is eligible to have corrected. Discussed importance of adequate emptying post feedings for  ongoing protection of supply. Allow 1-2 weeks of tenderness/soreness improvement with continued use of coconut oil and nipple shield. If feelings don't improve, return or reach out to lactation office for additional support.  LATCH Score                   Interventions   Placement of size 16mm NS  Lactation Tools Discussed/Used   Nipple shield, pump, coconut oil  Consult Status   Complete   Lavonia Drafts 05/13/2020, 9:44 AM

## 2020-05-14 ENCOUNTER — Other Ambulatory Visit: Payer: Self-pay | Admitting: Psychiatry

## 2020-05-14 DIAGNOSIS — F4322 Adjustment disorder with anxiety: Secondary | ICD-10-CM | POA: Diagnosis not present

## 2020-05-14 DIAGNOSIS — F902 Attention-deficit hyperactivity disorder, combined type: Secondary | ICD-10-CM | POA: Diagnosis not present

## 2020-05-19 ENCOUNTER — Other Ambulatory Visit: Payer: Self-pay | Admitting: Primary Care

## 2020-05-19 DIAGNOSIS — E039 Hypothyroidism, unspecified: Secondary | ICD-10-CM

## 2020-05-20 ENCOUNTER — Other Ambulatory Visit: Payer: Self-pay | Admitting: Primary Care

## 2020-07-22 DIAGNOSIS — Z20828 Contact with and (suspected) exposure to other viral communicable diseases: Secondary | ICD-10-CM | POA: Diagnosis not present

## 2020-07-23 ENCOUNTER — Other Ambulatory Visit: Payer: Self-pay | Admitting: Dermatology

## 2020-07-23 ENCOUNTER — Other Ambulatory Visit: Payer: Self-pay

## 2020-07-23 ENCOUNTER — Ambulatory Visit (INDEPENDENT_AMBULATORY_CARE_PROVIDER_SITE_OTHER): Payer: 59 | Admitting: Dermatology

## 2020-07-23 ENCOUNTER — Telehealth: Payer: 59 | Admitting: Family Medicine

## 2020-07-23 DIAGNOSIS — D1800 Hemangioma unspecified site: Secondary | ICD-10-CM

## 2020-07-23 DIAGNOSIS — B9689 Other specified bacterial agents as the cause of diseases classified elsewhere: Secondary | ICD-10-CM | POA: Diagnosis not present

## 2020-07-23 DIAGNOSIS — L814 Other melanin hyperpigmentation: Secondary | ICD-10-CM | POA: Diagnosis not present

## 2020-07-23 DIAGNOSIS — Z1283 Encounter for screening for malignant neoplasm of skin: Secondary | ICD-10-CM | POA: Diagnosis not present

## 2020-07-23 DIAGNOSIS — L219 Seborrheic dermatitis, unspecified: Secondary | ICD-10-CM | POA: Diagnosis not present

## 2020-07-23 DIAGNOSIS — L821 Other seborrheic keratosis: Secondary | ICD-10-CM

## 2020-07-23 DIAGNOSIS — J329 Chronic sinusitis, unspecified: Secondary | ICD-10-CM

## 2020-07-23 DIAGNOSIS — L578 Other skin changes due to chronic exposure to nonionizing radiation: Secondary | ICD-10-CM

## 2020-07-23 DIAGNOSIS — L309 Dermatitis, unspecified: Secondary | ICD-10-CM | POA: Diagnosis not present

## 2020-07-23 DIAGNOSIS — D229 Melanocytic nevi, unspecified: Secondary | ICD-10-CM | POA: Diagnosis not present

## 2020-07-23 DIAGNOSIS — L719 Rosacea, unspecified: Secondary | ICD-10-CM | POA: Diagnosis not present

## 2020-07-23 DIAGNOSIS — D2362 Other benign neoplasm of skin of left upper limb, including shoulder: Secondary | ICD-10-CM

## 2020-07-23 DIAGNOSIS — D2361 Other benign neoplasm of skin of right upper limb, including shoulder: Secondary | ICD-10-CM

## 2020-07-23 MED ORDER — TRIAMCINOLONE ACETONIDE 0.1 % EX CREA: 1.0000 "application " | TOPICAL_CREAM | Freq: Two times a day (BID) | CUTANEOUS | 2 refills | Status: DC | PRN

## 2020-07-23 MED ORDER — AMOXICILLIN-POT CLAVULANATE 875-125 MG PO TABS: 1.0000 | ORAL_TABLET | Freq: Two times a day (BID) | ORAL | 0 refills | Status: DC

## 2020-07-23 MED ORDER — HYDROCORTISONE 2.5 % EX CREA: TOPICAL_CREAM | Freq: Two times a day (BID) | CUTANEOUS | 1 refills | Status: DC | PRN

## 2020-07-23 NOTE — Patient Instructions (Addendum)
Melanoma ABCDEs  Melanoma is the most dangerous type of skin cancer, and is the leading cause of death from skin disease.  You are more likely to develop melanoma if you:  Have light-colored skin, light-colored eyes, or red or blond hair  Spend a lot of time in the sun  Tan regularly, either outdoors or in a tanning bed  Have had blistering sunburns, especially during childhood  Have a close family member who has had a melanoma  Have atypical moles or large birthmarks  Early detection of melanoma is key since treatment is typically straightforward and cure rates are extremely high if we catch it early.   The first sign of melanoma is often a change in a mole or a new dark spot.  The ABCDE system is a way of remembering the signs of melanoma.  A for asymmetry:  The two halves do not match. B for border:  The edges of the growth are irregular. C for color:  A mixture of colors are present instead of an even brown color. D for diameter:  Melanomas are usually (but not always) greater than 66mm - the size of a pencil eraser. E for evolution:  The spot keeps changing in size, shape, and color.  Please check your skin once per month between visits. You can use a small mirror in front and a large mirror behind you to keep an eye on the back side or your body.   If you see any new or changing lesions before your next follow-up, please call to schedule a visit.  Please continue daily skin protection including broad spectrum sunscreen SPF 30+ to sun-exposed areas, reapplying every 2 hours as needed when you're outdoors.   Recommend taking Heliocare sun protection supplement daily in sunny weather for additional sun protection. For maximum protection on the sunniest days, you can take up to 2 capsules of regular Heliocare OR take 1 capsule of Heliocare Ultra. For prolonged exposure (such as a full day in the sun), you can repeat your dose of the supplement 4 hours after your first dose. Heliocare  can be purchased at [facility] or at [email or web address].   Topical steroids (such as triamcinolone, fluocinolone, fluocinonide, mometasone, clobetasol, halobetasol, betamethasone, hydrocortisone) can cause thinning and lightening of the skin if they are used for too long in the same area. Your physician has selected the right strength medicine for your problem and area affected on the body. Please use your medication only as directed by your physician to prevent side effects.   Avoid applying to face, groin, and axilla. Use as directed. Risk of skin atrophy with long-term use reviewed.   Instructions for [facility] Medications  One or more of your medications was sent to the [facility] mail order compounding pharmacy. You will receive an email from them and can purchase the medicine through that link. It will then be mailed to your home at the address you confirmed. If for any reason you do not receive an email from them, please check your spam folder. If you still do not find the email, please let us know. [facility] phone number is [phone].

## 2020-07-23 NOTE — Progress Notes (Signed)
Follow-Up Visit   Subjective  Christine Adams is a 40 y.o. female who presents for the following: tbse (Patient here today for tbse. She states she is breaking out on chin,  right external ear spot that keeps bleeding. She denies history of skin cancer and denies other concerns at this time. ).  Patient here for full body skin exam and skin cancer screening.  Her rosacea has been flared up (she has had for years) and she has not used any medications for it lately. She is still breastfeeding.   The following portions of the chart were reviewed this encounter and updated as appropriate:  Tobacco  Allergies  Meds  Problems  Med Hx  Surg Hx  Fam Hx      Objective  Well appearing patient in no apparent distress; mood and affect are within normal limits.  A full examination was performed including scalp, head, eyes, ears, nose, lips, neck, chest, axillae, abdomen, back, buttocks, bilateral upper extremities, bilateral lower extremities, hands, feet, fingers, toes, fingernails, and toenails. All findings within normal limits unless otherwise noted below.  Objective  Head - Anterior (Face): Mid face erythema with telangiectasias with scattered inflammatory papules.   Objective  bilateral ears: Tiny scaly pink plaques at bilateral ears   Objective  face: Scaly pink plaques at chin  Assessment & Plan  Rosacea Head - Anterior (Face)  Chronic condition with duration over one year. Condition is bothersome to patient. Currently flared.  Will prescribe [facility] metronidazole/ivermectin/azelaic acid twice daily as needed to affected areas on the face. The patient was advised this is not covered by insurance since it is made by a compounding pharmacy. They will receive an email to check out and the medication will be mailed to their home.    Rosacea is a chronic progressive skin condition usually affecting the face of adults, causing redness and/or acne bumps. It is treatable  but not curable. It sometimes affects the eyes (ocular rosacea) as well. It may respond to topical and/or systemic medication and can flare with stress, sun exposure, alcohol, exercise and some foods.  Daily application of broad spectrum spf 30+ sunscreen to face is recommended to reduce flares.  Start prescription tretinion 0.0125 % with hyaluronic acid sent through [facility]. Advised to use cautiously since sometimes can flare rosacea but she has tolerated in the past.  Topical retinoid medications like tretinoin/Retin-A, adapalene/Differin, tazarotene/Fabior, and Epiduo/Epiduo Forte can cause dryness and irritation when first started. Only apply a pea-sized amount to the entire affected area. Avoid applying it around the eyes, edges of mouth and creases at the nose. If you experience irritation, use a good moisturizer first and/or apply the medicine less often. If you are doing well with the medicine, you can increase how often you use it until you are applying every night. Be careful with sun protection while using this medication as it can make you sensitive to the sun. This medicine should not be used by pregnant women.    Seborrheic dermatitis bilateral ears  No neoplasm appreciated today. Call if this worsens or does not respond to treatment.   Start Triamcinolone 0.1 % cream applying a small amount to areas of ear twice daily as needed up to 2-3 weeks. Avoid applying to face, groin, and axilla. Use as directed. Risk of skin atrophy with long-term use reviewed.   Topical steroids (such as triamcinolone, fluocinolone, fluocinonide, mometasone, clobetasol, halobetasol, betamethasone, hydrocortisone) can cause thinning and lightening of the skin if they  are used for too long in the same area. Your physician has selected the right strength medicine for your problem and area affected on the body. Please use your medication only as directed by your physician to prevent side effects.     Ordered Medications: triamcinolone (KENALOG) 0.1 %  Dermatitis face  Start Hydrocortisone 2.5 % cream apply twice daily as needed to affected areas for two weeks   Topical steroids (such as triamcinolone, fluocinolone, fluocinonide, mometasone, clobetasol, halobetasol, betamethasone, hydrocortisone) can cause thinning and lightening of the skin if they are used for too long in the same area. Your physician has selected the right strength medicine for your problem and area affected on the body. Please use your medication only as directed by your physician to prevent side effects.    Ordered Medications: hydrocortisone 2.5 % cream   Lentigines - Scattered tan macules - Discussed due to sun exposure - Benign, observe - Call for any changes  Seborrheic Keratoses - Stuck-on, waxy, tan-brown papules and plaques  - Discussed benign etiology and prognosis. - Observe - Call for any changes  Melanocytic Nevi - Tan-brown and/or pink-flesh-colored symmetric macules and papules - Benign appearing on exam today - Observation - Call clinic for new or changing moles - Recommend daily use of broad spectrum spf 30+ sunscreen to sun-exposed areas.   Hemangiomas - Red papules - Discussed benign nature - Observe - Call for any changes  Actinic Damage - Chronic, secondary to cumulative UV/sun exposure - diffuse scaly erythematous macules with underlying dyspigmentation - Recommend daily broad spectrum sunscreen SPF 30+ to sun-exposed areas, reapply every 2 hours as needed.  - Call for new or changing lesions.  Dermatofibroma Bilateral arms  - Firm pink/brown papulenodule with dimple sign - Benign appearing - Call for any changes   Skin cancer screening performed today.  Return in about 1 year (around 07/23/2021) for TBSE and as needed .  I, Ruthell Rummage, CMA, am acting as scribe for Forest Gleason, MD.  Documentation: I have reviewed the above documentation for accuracy and  completeness, and I agree with the above.  Forest Gleason, MD

## 2020-07-23 NOTE — Progress Notes (Signed)
Ms. Christine, Adams are scheduled for a virtual visit with your provider today.    Just as we do with appointments in the office, we must obtain your consent to participate.  Your consent will be active for this visit and any virtual visit you may have with one of our providers in the next 365 days.    If you have a MyChart account, I can also send a copy of this consent to you electronically.  All virtual visits are billed to your insurance company just like a traditional visit in the office.  As this is a virtual visit, video technology does not allow for your provider to perform a traditional examination.  This may limit your provider's ability to fully assess your condition.  If your provider identifies any concerns that need to be evaluated in person or the need to arrange testing such as labs, EKG, etc, we will make arrangements to do so.    Although advances in technology are sophisticated, we cannot ensure that it will always work on either your end or our end.  If the connection with a video visit is poor, we may have to switch to a telephone visit.  With either a video or telephone visit, we are not always able to ensure that we have a secure connection.   I need to obtain your verbal consent now.   Are you willing to proceed with your visit today?   Christine Adams has provided verbal consent on 07/23/2020 for a virtual visit (video or telephone).   Carla Drape, MD 07/23/2020  6:52 PM   Date:  07/23/2020   ID:  Christine Adams, DOB [birth year], MRN [id number]  Patient Location: Home Provider Location: Home Office   Participants: Patient and Provider for Visit and Wrap up  Method of visit: Video  Location of Patient: Home Location of Provider: Home Office Consent was obtain for visit over the video. Services rendered by provider: Visit was performed via video  A video enabled telemedicine application was used and I verified that I am speaking with the correct person using two  identifiers.  PCP:  Pleas Koch, NP   Chief Complaint:  Possible sinus infection  History of Present Illness:    Christine Adams is a 40 y.o. female with history as stated below. Presents video telehealth for an acute care visit  Onset of symptoms was 4 days ago and symptoms have been persistent and include: Dental pain, ear pressure, sinus tenderness.  COVID test negative x 3.  Symptoms have been going on since Saturday (4 days ago). Denies having fevers, chills, shortness of breath, cough, chest pain, ear pain, sore throat or exposure to covid or other sick contacts. Patient is currently breastfeeding and is trying to be mindful of decongestant use. No other aggravating or relieving factors.  Patient has taken Afrin to help with congestion.  The patient does not have symptoms concerning for COVID-19 infection (fever, chills, cough, or new shortness of breath).   Past Medical, Surgical, Social History, Allergies, and Medications have been Reviewed.  Past Medical History:  Diagnosis Date  . ADHD   . Hypothyroidism     Current Meds  Medication Sig  . amoxicillin-clavulanate (AUGMENTIN) 875-125 MG tablet Take 1 tablet by mouth 2 (two) times daily.     Allergies:   Molds & smuts   Review of Systems  HENT: Negative for congestion, ear pain and sinus pain.   Respiratory: Negative for sputum production.    See HPI  for history of present illness.  Physical Exam Constitutional:      Appearance: Normal appearance.  HENT:     Head: Normocephalic.     Nose: Congestion present.  Pulmonary:     Effort: Pulmonary effort is normal.  Neurological:     Mental Status: She is alert and oriented to person, place, and time.  Psychiatric:        Mood and Affect: Mood normal.        Behavior: Behavior normal.        Thought Content: Thought content normal.        Judgment: Judgment normal.               There are no diagnoses linked to this encounter. Acute Bacterial  Sinusitis - symptoms of facial pain, sinus tenderness, ear fullness, dental pain - COVID test negative x 3 - viral syndrome in patient's daughter - symptoms ongoing and unimproved with conservative OTC treatment - start Augmentin BID for 10 days - encouraged use of humidifier, Vicks and mucinex (without decongestant)  Time:   Today, I have spent 14 minutes with the patient with telehealth technology discussing the above problems, reviewing the chart, previous notes, medications and orders.    Tests Ordered: No orders of the defined types were placed in this encounter.   Medication Changes: Meds ordered this encounter  Medications  . amoxicillin-clavulanate (AUGMENTIN) 875-125 MG tablet    Sig: Take 1 tablet by mouth 2 (two) times daily.    Dispense:  20 tablet    Refill:  0     Disposition:  Follow up  Signed, Carla Drape, MD  07/23/2020 6:52 PM

## 2020-07-27 ENCOUNTER — Encounter: Payer: Self-pay | Admitting: Dermatology

## 2020-08-04 ENCOUNTER — Other Ambulatory Visit: Payer: Self-pay | Admitting: Psychiatry

## 2020-08-04 DIAGNOSIS — F4322 Adjustment disorder with anxiety: Secondary | ICD-10-CM | POA: Diagnosis not present

## 2020-08-04 DIAGNOSIS — F902 Attention-deficit hyperactivity disorder, combined type: Secondary | ICD-10-CM | POA: Diagnosis not present

## 2020-10-16 ENCOUNTER — Encounter: Payer: Self-pay | Admitting: Physician Assistant

## 2020-10-16 ENCOUNTER — Other Ambulatory Visit: Payer: Self-pay | Admitting: Physician Assistant

## 2020-10-16 DIAGNOSIS — Z6830 Body mass index (BMI) 30.0-30.9, adult: Secondary | ICD-10-CM | POA: Insufficient documentation

## 2020-10-16 MED ORDER — NIRMATRELVIR/RITONAVIR (PAXLOVID)TABLET: 3.0000 | ORAL_TABLET | Freq: Two times a day (BID) | ORAL | 0 refills | Status: AC

## 2020-10-16 NOTE — Progress Notes (Signed)
Outpatient Oral COVID Treatment Note  I connected with Christine Adams on 10/16/2020/9:45 AM by telephone and verified that I am speaking with the correct person using two identifiers.  I discussed the limitations, risks, security, and privacy concerns of performing an evaluation and management service by telephone and the availability of in person appointments. I also discussed with the patient that there may be a patient responsible charge related to this service. The patient expressed understanding and agreed to proceed.  Patient location: home Provider location: office  Diagnosis: COVID-19 infection  Purpose of visit: Discussion of potential use of Molnupiravir or Paxlovid, a new treatment for mild to moderate COVID-19 viral infection in non-hospitalized patients.   Subjective: Patient is a 40 y.o. female who has been diagnosed with COVID 19 viral infection.  Their symptoms began on 4/21 with cough and cold symptoms.    Past Medical History:  Diagnosis Date  . ADHD   . Asthma   . BMI 30.0-30.9,adult   . Hypothyroidism     Allergies  Allergen Reactions  . Molds & Smuts Itching    Dust mites, mold airborne      Current Outpatient Medications:  .  ADDERALL XR 15 MG 24 hr capsule, Take 15 mg by mouth daily.  (Patient not taking: No sig reported), Disp: , Rfl: 0 .  albuterol (PROVENTIL HFA;VENTOLIN HFA) 108 (90 Base) MCG/ACT inhaler, Inhale into the lungs every 6 (six) hours as needed., Disp: , Rfl:  .  ALPRAZolam (XANAX) 0.25 MG tablet, Take 0.25 mg by mouth as needed for anxiety. (Patient not taking: No sig reported), Disp: , Rfl:  .  amoxicillin-clavulanate (AUGMENTIN) 875-125 MG tablet, Take 1 tablet by mouth 2 (two) times daily., Disp: 20 tablet, Rfl: 0 .  Azelaic Acid 15 % cream, , Disp: , Rfl:  .  beclomethasone (QVAR) 80 MCG/ACT inhaler, Inhale 1 puff into the lungs daily., Disp: , Rfl:  .  cetirizine (ZYRTEC) 10 MG tablet, Take 10 mg by mouth daily., Disp: , Rfl:  .   cholecalciferol (VITAMIN D) 1000 units tablet, Take 1,000 Units by mouth daily., Disp: , Rfl:  .  hydrocortisone 2.5 % cream, APPLY TOPICALLY 2 TIMES DAILY AS NEEDED (AREAS ON CHIN) FOR 2 WEEKS., Disp: 28 g, Rfl: 1 .  ibuprofen (ADVIL) 600 MG tablet, Take 1 tablet (600 mg total) by mouth every 6 (six) hours as needed for mild pain, moderate pain or cramping., Disp: 60 tablet, Rfl: 0 .  levothyroxine (SYNTHROID) 125 MCG tablet, TAKE 1 TABLET BY MOUTH EVERY MORNING ON AN EMPTY STOMACH WITH WATER ONLY. NO FOOD OR OTHER MEDICATIONS FOR 30 MINUTES., Disp: 90 tablet, Rfl: 1 .  Multiple Vitamin (MULTIVITAMIN) tablet, Take 1 tablet by mouth daily., Disp: , Rfl:  .  sertraline (ZOLOFT) 50 MG tablet, Take 75 mg by mouth daily., Disp: , Rfl:  .  sertraline (ZOLOFT) 50 MG tablet, TAKE 1 & 1/2 TABLETS BY MOUTH DAILY, Disp: 45 tablet, Rfl: 2 .  sertraline (ZOLOFT) 50 MG tablet, TAKE 1 TO 1 & 1/2 TABLETS BY MOUTH DAILY, Disp: 45 tablet, Rfl: 3 .  sertraline (ZOLOFT) 50 MG tablet, TAKE 1 TO 1 & 1/2 TABLETS BY MOUTH DAILY, Disp: 45 tablet, Rfl: 1 .  triamcinolone (KENALOG) 0.1 %, APPLY SMALL AMOUNT TO EARS 2 TIMES A DAY AS NEEDED. AVOID APPLYING TO FACE, GROIN, AND AXILLA., Disp: 80 g, Rfl: 2 .  valACYclovir (VALTREX) 500 MG tablet, Take by mouth as needed., Disp: , Rfl:  .  valACYclovir (VALTREX) 500 MG tablet, TAKE 4 TABLETS BY MOUTH AT FIRST SIGN OF FEVER BLISTER, THEN TAKE 4 TABLETS 12 HOURS LATER, Disp: 16 tablet, Rfl: 99  Objective: Patient sounds stable.  They are in no apparent distress.  Breathing is non labored.  Mood and behavior are normal.  Laboratory Data:  No results found for this or any previous visit (from the past 2160 hour(s)).   Assessment: 40 y.o. female with mild/moderate COVID 19 viral infection diagnosed on 4/22 at high risk for progression to severe COVID 19.  Plan:  This patient is a 40 y.o. female that meets the following criteria for Emergency Use Authorization of: Paxlovid 1. Age  >12 yr AND > 40 kg 2. SARS-COV-2 positive test 3. Symptom onset < 5 days 4. Mild-to-moderate COVID disease with high risk for severe progression to hospitalization or death  I have spoken and communicated the following to the patient or parent/caregiver regarding: 1. Paxlovid is an unapproved drug that is authorized for use under an Emergency Use Authorization.  2. There are no adequate, approved, available products for the treatment of COVID-19 in adults who have mild-to-moderate COVID-19 and are at high risk for progressing to severe COVID-19, including hospitalization or death. 3. Other therapeutics are currently authorized. For additional information on all products authorized for treatment or prevention of COVID-19, please see [email or web address].  4. There are benefits and risks of taking this treatment as outlined in the "Fact Sheet for Patients and Caregivers."  5. "Fact Sheet for Patients and Caregivers" was reviewed with patient. A hard copy will be provided to patient from pharmacy prior to the patient receiving treatment. 6. Patients should continue to self-isolate and use infection control measures (e.g., wear mask, isolate, social distance, avoid sharing personal items, clean and disinfect "high touch" surfaces, and frequent handwashing) according to CDC guidelines.  7. The patient or parent/caregiver has the option to accept or refuse treatment. 8. Patient medication history was reviewed for potential drug interactions:No drug interactions 9. Patient's GFR was calculated to be >60, and they were therefore prescribed Normal dose (GFR>60) - nirmatrelvir 150mg  tab (2 tablet) by mouth twice daily AND ritonavir 100mg  tab (1 tablet) by mouth twice daily   After reviewing above information with the patient, the patient agrees to receive Paxlovid.  Follow up instructions:    . Take  prescription BID x 5 days as directed . Reach out to pharmacist for counseling on medication if desired . For concerns regarding further COVID symptoms please follow up with your PCP or urgent care . For urgent or life-threatening issues, seek care at your local emergency department  The patient was provided an opportunity to ask questions, and all were answered. The patient agreed with the plan and demonstrated an understanding of the instructions.   [facility]  The patient was advised to call their PCP or seek an in-person evaluation if the symptoms worsen or if the condition fails to improve as anticipated.   I provided 10 minutes of non face-to-face telephone visit time during this encounter, and > 50% was spent counseling as documented under my assessment & plan.  Angelena Form, PA-C 10/16/2020 /9:45 AM

## 2020-10-27 ENCOUNTER — Other Ambulatory Visit: Payer: Self-pay

## 2020-10-27 DIAGNOSIS — F902 Attention-deficit hyperactivity disorder, combined type: Secondary | ICD-10-CM | POA: Diagnosis not present

## 2020-10-27 DIAGNOSIS — F4322 Adjustment disorder with anxiety: Secondary | ICD-10-CM | POA: Diagnosis not present

## 2020-10-27 MED ORDER — SERTRALINE HCL 100 MG PO TABS
ORAL_TABLET | ORAL | 2 refills | Status: DC
  Filled 2020-10-27: qty 30, 30d supply, fill #0
  Filled 2020-12-31: qty 30, 30d supply, fill #1
  Filled 2021-02-26: qty 30, 30d supply, fill #2

## 2020-10-28 ENCOUNTER — Ambulatory Visit: Payer: 59 | Attending: Primary Care

## 2020-10-28 ENCOUNTER — Other Ambulatory Visit: Payer: Self-pay

## 2020-10-28 DIAGNOSIS — M545 Low back pain, unspecified: Secondary | ICD-10-CM | POA: Insufficient documentation

## 2020-10-28 NOTE — Therapy (Signed)
[place] [facility] [address] [place], [place], [address] Phone: [phone]   Fax:  [phone]  Patient Details  Name: Christine Adams MRN: [id number] Date of Birth: 1981-04-28 Referring Provider:  Pleas Koch, NP  Encounter Date: 10/28/2020  PT/OT/SLP Screening Form   Time: in 12:59 Time out 13: 32   Complaint acute back pain Past Medical Hx:  L4-5 fx  Injury Date:________________________________  Pain Scale: __ __________ Patient's phone number:   Hx (this occurrence):   Patient went to the restroom and felt severe back pain upon reaching for toilet paper. Was unable to stand from toilet and had to lower self to the floor. She has an 39 year old infant and history of L4-5 bulging disc.    Assessment: Patient assessed in standing position as she is unable to tolerate sitting. Is non tender to gentle grade I UPAs but unable to tolerate CPAs due to excessive muscle guarding bilaterally. R quadratus is more impaired than left however bilateral paraspinals are flared in protective mechanisms. STM to bilateral paraspinals and quadratus reduce pain with implementation of effleurage and ptrissage as well as swelling reduction technique. K tape applied to allow patient pain relief to be able to return home.    Recommendations:    Comments: Patient's symptoms are indicative of acute muscle sprain. Education on short term interventions including heat/cold, rest and gentle stretching provided as well as long term intervention provided with following protocol once pain has reduced. Patient agreeable to re-contact therapist in a weeks time to touch base on pain control/need for future session. If pain does not resolve patient agreeable that further steps may need to be taken   Access Code: [email or web address] URL: [email or web address] Date: 10/28/2020 Prepared by: Janna Arch  Exercises  . Seated Transversus Abdominis  Bracing - 1 x daily - 7 x weekly - 2 sets - 10 reps - 5 hold . Seated Multifidi Isometric - 1 x daily - 7 x weekly - 3 sets - 10 reps - 5 hold . Supine Transversus Abdominis Bracing - Hands on Stomach - 1 x daily - 7 x weekly - 3 sets - 10 reps - 5 hold . Supine Diaphragmatic Breathing - 1 x daily - 7 x weekly - 3 sets - 10 reps - 5 hold . Supine Transversus Abdominis Bracing with Heel Slide - 1 x daily - 7 x weekly - 3 sets - 10 reps - 5 hold . Bent Knee Fallouts - 1 x daily - 7 x weekly - 3 sets - 10 reps - 5 hold . Supine March - 1 x daily - 7 x weekly - 3 sets - 10 reps - 5 hold . Clamshell - 1 x daily - 7 x weekly - 3 sets - 10 reps - 5 hold . Quadruped Alternating Arm Lift - 1 x daily - 7 x weekly - 3 sets - 10 reps - 5 hold . Quadruped Alternating Leg Extensions - 1 x daily - 7 x weekly - 3 sets - 10 reps - 5 hold  Patient Education . Managing Back Pain . Low Back Pain . Low Back Pain Handout . Ice . Heat  [x]  Patient would benefit from an MD referral if pain does not resolve in > 1 week.  [x]  Patient would benefit from a full PT/OT/ SLP evaluation and treatment if pain does not resolve  [x]  No intervention recommended at this time if pain resolves in a weeks time  Janna Arch, PT, DPT    10/28/2020, 1:32 PM  [place] [facility] SERVICES [address] [place], [place], [address] Phone: [phone]   Fax:  [phone]

## 2020-11-25 DIAGNOSIS — Z30432 Encounter for removal of intrauterine contraceptive device: Secondary | ICD-10-CM | POA: Diagnosis not present

## 2020-11-30 DIAGNOSIS — R5383 Other fatigue: Secondary | ICD-10-CM

## 2020-11-30 DIAGNOSIS — E039 Hypothyroidism, unspecified: Secondary | ICD-10-CM

## 2020-12-09 ENCOUNTER — Encounter: Payer: Self-pay | Admitting: Primary Care

## 2020-12-09 ENCOUNTER — Other Ambulatory Visit: Payer: Self-pay

## 2020-12-09 ENCOUNTER — Ambulatory Visit: Payer: 59 | Admitting: Primary Care

## 2020-12-09 VITALS — BP 110/62 | HR 97 | Temp 98.0°F | Ht 66.0 in | Wt 196.0 lb

## 2020-12-09 DIAGNOSIS — R5383 Other fatigue: Secondary | ICD-10-CM

## 2020-12-09 DIAGNOSIS — J4520 Mild intermittent asthma, uncomplicated: Secondary | ICD-10-CM

## 2020-12-09 DIAGNOSIS — E039 Hypothyroidism, unspecified: Secondary | ICD-10-CM | POA: Diagnosis not present

## 2020-12-09 DIAGNOSIS — F419 Anxiety disorder, unspecified: Secondary | ICD-10-CM | POA: Diagnosis not present

## 2020-12-09 LAB — COMPREHENSIVE METABOLIC PANEL
ALT: 19 U/L (ref 0–35)
AST: 17 U/L (ref 0–37)
Albumin: 4.5 g/dL (ref 3.5–5.2)
Alkaline Phosphatase: 97 U/L (ref 39–117)
BUN: 12 mg/dL (ref 6–23)
CO2: 26 mEq/L (ref 19–32)
Calcium: 9 mg/dL (ref 8.4–10.5)
Chloride: 105 mEq/L (ref 96–112)
Creatinine, Ser: 0.67 mg/dL (ref 0.40–1.20)
GFR: 110.56 mL/min (ref >60.00)
Glucose, Bld: 81 mg/dL (ref 70–99)
Potassium: 4.3 mEq/L (ref 3.5–5.1)
Sodium: 138 mEq/L (ref 135–145)
Total Bilirubin: 0.6 mg/dL (ref 0.2–1.2)
Total Protein: 7.2 g/dL (ref 6.0–8.3)

## 2020-12-09 LAB — CBC
HCT: 43.7 % (ref 36.0–46.0)
Hemoglobin: 14.9 g/dL (ref 12.0–15.0)
MCHC: 34.1 g/dL (ref 30.0–36.0)
MCV: 85.7 fl (ref 78.0–100.0)
Platelets: 228 10*3/uL (ref 150.0–400.0)
RBC: 5.1 Mil/uL (ref 3.87–5.11)
RDW: 13.3 % (ref 11.5–15.5)
WBC: 7.2 10*3/uL (ref 4.0–10.5)

## 2020-12-09 LAB — TSH: TSH: 4.2 u[IU]/mL (ref 0.35–4.50)

## 2020-12-09 LAB — IBC + FERRITIN
Ferritin: 54.9 ng/mL (ref 10.0–291.0)
Iron: 87 ug/dL (ref 42–145)
Saturation Ratios: 27.9 % (ref 20.0–50.0)
Transferrin: 223 mg/dL (ref 212.0–360.0)

## 2020-12-09 LAB — VITAMIN D 25 HYDROXY (VIT D DEFICIENCY, FRACTURES): VITD: 51.25 ng/mL (ref 30.00–100.00)

## 2020-12-09 LAB — VITAMIN B12: Vitamin B-12: 591 pg/mL (ref 211–911)

## 2020-12-09 NOTE — Assessment & Plan Note (Signed)
Doing well on Qvar inhaler, continue same. Infrequent use of albuterol.

## 2020-12-09 NOTE — Patient Instructions (Signed)
Stop by the lab prior to leaving today. I will notify you of your results once received.   It was a pleasure to see you today!  

## 2020-12-09 NOTE — Assessment & Plan Note (Signed)
Overall stable, follows with psychiatry. If lab work up negative she will follow back up for medication adjustment.

## 2020-12-09 NOTE — Assessment & Plan Note (Signed)
Acute over the last 6-8 weeks since Covid.  Could be post Covid fatigue. Checking labs to rule out metabolic cause.   Consider anxiety, she will touch base with psychiatry.   Await results.

## 2020-12-09 NOTE — Progress Notes (Signed)
Subjective:    Patient ID: Christine Adams, female    DOB: August 21, 1980, 40 y.o.   MRN: [id number]  HPI  Christine Adams is a very pleasant 40 y.o. female with a history of ADHD, hypothyroidism, asthma, anxiety who presents today to discuss fatigue.  She sent a message via MyChart last week requesting updated labs given symptoms of fatigue. She was asked to come in for further evaluation of symptoms.   She is managed on levothyroxine 125 mcg last TSH was 2.75 in October 2021. She is compliant to her regimen daily and takes every morning on an empty stomach with water only, no food or other medications for at least 30 min. She has gained about 10 pounds.   Follows with psychiatry for ADHD and anxiety, overall feels well managed on her regimen of Zoloft 100 mg. She is no longer on  Adderall XR 15 mg or PRN alprazolam 0.25 mg. She is under a lot of stress with work but feels that she can handle.   She had Covid-19 infection in late April 2022, very mild symptoms, recovered quickly. She did sleep a lot, had some fatigue but not as bad as now. Since then she's noticed a progression of fatigue, feels like she can fall asleep on her drive to work, naps during the weekend which is unusual. She sleeps during the night without difficulty, no snoring.   Wt Readings from Last 3 Encounters:  12/09/20 196 lb (88.9 kg)  02/14/20 193 lb 4 oz (87.7 kg)  10/11/19 219 lb (99.3 kg)     BP Readings from Last 3 Encounters:  12/09/20 110/62  02/14/20 116/74  10/14/19 138/90      Review of Systems  Constitutional:  Positive for fatigue.  Respiratory:  Negative for shortness of breath.   Cardiovascular:  Negative for palpitations.  Psychiatric/Behavioral:  The patient is not nervous/anxious.         Past Medical History:  Diagnosis Date   ADHD    Asthma    BMI 30.0-30.9,adult    Hypothyroidism     Social History   Socioeconomic History   Marital status: Married    Spouse name: Not on file    Number of children: Not on file   Years of education: Not on file   Highest education level: Not on file  Occupational History   Not on file  Tobacco Use   Smoking status: Never   Smokeless tobacco: Never  Substance and Sexual Activity   Alcohol use: Yes    Comment: occasional   Drug use: Never   Sexual activity: Yes    Partners: Male  Other Topics Concern   Not on file  Social History Narrative   Single.   No children.   In school for Nurse Practitioner.   Enjoys riding horses, spending outdoors.   Social Determinants of Health   Financial Resource Strain: Not on file  Food Insecurity: Not on file  Transportation Needs: Not on file  Physical Activity: Not on file  Stress: Not on file  Social Connections: Not on file  Intimate Partner Violence: Not on file    Past Surgical History:  Procedure Laterality Date   WISDOM TOOTH EXTRACTION      Family History  Problem Relation Age of Onset   Clotting disorder Mother    Sudden Cardiac Death Father    Hypertension Father    Hyperlipidemia Father    ADD / ADHD Sister    Diabetes Maternal Grandmother  Mesothelioma Maternal Grandmother    Alcohol abuse Paternal Grandmother    Hypertension Paternal Grandfather     Allergies  Allergen Reactions   Molds & Smuts Itching    Dust mites, mold airborne     Current Outpatient Medications on File Prior to Visit  Medication Sig Dispense Refill   ADDERALL XR 15 MG 24 hr capsule Take 15 mg by mouth daily.  (Patient not taking: No sig reported)  0   albuterol (PROVENTIL HFA;VENTOLIN HFA) 108 (90 Base) MCG/ACT inhaler Inhale into the lungs every 6 (six) hours as needed.     ALPRAZolam (XANAX) 0.25 MG tablet Take 0.25 mg by mouth as needed for anxiety. (Patient not taking: No sig reported)     amoxicillin-clavulanate (AUGMENTIN) 875-125 MG tablet Take 1 tablet by mouth 2 (two) times daily. 20 tablet 0   Azelaic Acid 15 % cream      beclomethasone (QVAR) 80 MCG/ACT inhaler  Inhale 1 puff into the lungs daily.     cetirizine (ZYRTEC) 10 MG tablet Take 10 mg by mouth daily.     cholecalciferol (VITAMIN D) 1000 units tablet Take 1,000 Units by mouth daily.     hydrocortisone 2.5 % cream APPLY TOPICALLY 2 TIMES DAILY AS NEEDED (AREAS ON CHIN) FOR 2 WEEKS. 28 g 1   ibuprofen (ADVIL) 600 MG tablet Take 1 tablet (600 mg total) by mouth every 6 (six) hours as needed for mild pain, moderate pain or cramping. 60 tablet 0   levothyroxine (SYNTHROID) 125 MCG tablet TAKE 1 TABLET BY MOUTH EVERY MORNING ON AN EMPTY STOMACH WITH WATER ONLY. NO FOOD OR OTHER MEDICATIONS FOR 30 MINUTES. 90 tablet 1   Multiple Vitamin (MULTIVITAMIN) tablet Take 1 tablet by mouth daily.     sertraline (ZOLOFT) 100 MG tablet Take  1 tablet daily for anxiety/OCD 30 tablet 2   sertraline (ZOLOFT) 50 MG tablet Take 75 mg by mouth daily.     sertraline (ZOLOFT) 50 MG tablet TAKE 1 & 1/2 TABLETS BY MOUTH DAILY 45 tablet 2   sertraline (ZOLOFT) 50 MG tablet TAKE 1 TO 1 & 1/2 TABLETS BY MOUTH DAILY 45 tablet 3   sertraline (ZOLOFT) 50 MG tablet TAKE 1 TO 1 & 1/2 TABLETS BY MOUTH DAILY 45 tablet 1   triamcinolone (KENALOG) 0.1 % APPLY SMALL AMOUNT TO EARS 2 TIMES A DAY AS NEEDED. AVOID APPLYING TO FACE, GROIN, AND AXILLA. 80 g 2   valACYclovir (VALTREX) 500 MG tablet Take by mouth as needed.     valACYclovir (VALTREX) 500 MG tablet TAKE 4 TABLETS BY MOUTH AT FIRST SIGN OF FEVER BLISTER, THEN TAKE 4 TABLETS 12 HOURS LATER 16 tablet 99   No current facility-administered medications on file prior to visit.    BP 110/62   Pulse 97   Temp 98 F (36.7 C) (Temporal)   Ht 5\' 6"  (1.676 m)   Wt 196 lb (88.9 kg)   SpO2 97%   BMI 31.64 kg/m  Objective:   Physical Exam Cardiovascular:     Rate and Rhythm: Normal rate and regular rhythm.  Pulmonary:     Effort: Pulmonary effort is normal.     Breath sounds: Normal breath sounds.  Musculoskeletal:     Cervical back: Neck supple.  Skin:    General: Skin is  warm and dry.  Psychiatric:        Mood and Affect: Mood normal.          Assessment & Plan:  This visit occurred during the SARS-CoV-2 public health emergency.  Safety protocols were in place, including screening questions prior to the visit, additional usage of staff PPE, and extensive cleaning of exam room while observing appropriate contact time as indicated for disinfecting solutions.

## 2020-12-09 NOTE — Assessment & Plan Note (Signed)
She appears to be taking levothyroxine correctly. Continue 125 mcg.  Repeat TSH pending.

## 2020-12-21 ENCOUNTER — Other Ambulatory Visit: Payer: Self-pay | Admitting: Primary Care

## 2020-12-21 DIAGNOSIS — E039 Hypothyroidism, unspecified: Secondary | ICD-10-CM

## 2020-12-22 ENCOUNTER — Other Ambulatory Visit: Payer: Self-pay

## 2020-12-22 ENCOUNTER — Encounter (INDEPENDENT_AMBULATORY_CARE_PROVIDER_SITE_OTHER): Payer: Self-pay | Admitting: Internal Medicine

## 2020-12-22 ENCOUNTER — Ambulatory Visit (INDEPENDENT_AMBULATORY_CARE_PROVIDER_SITE_OTHER): Payer: 59 | Admitting: Internal Medicine

## 2020-12-22 VITALS — BP 140/89 | HR 89 | Temp 97.5°F | Resp 18 | Ht 65.0 in | Wt 200.2 lb

## 2020-12-22 DIAGNOSIS — E039 Hypothyroidism, unspecified: Secondary | ICD-10-CM

## 2020-12-22 DIAGNOSIS — E669 Obesity, unspecified: Secondary | ICD-10-CM

## 2020-12-22 MED ORDER — NP THYROID 90 MG PO TABS
90.0000 mg | ORAL_TABLET | Freq: Every day | ORAL | 3 refills | Status: DC
  Filled 2020-12-22: qty 30, 30d supply, fill #0
  Filled 2021-02-01: qty 30, 30d supply, fill #1

## 2020-12-22 MED ORDER — LEVOTHYROXINE SODIUM 137 MCG PO TABS
ORAL_TABLET | ORAL | 0 refills | Status: DC
Start: 2020-12-22 — End: 2021-05-25
  Filled 2020-12-22: qty 90, 90d supply, fill #0

## 2020-12-22 NOTE — Progress Notes (Signed)
Metrics: Intervention Frequency ACO  Documented Smoking Status Yearly  Screened one or more times in 24 months  Cessation Counseling or  Active cessation medication Past 24 months  Past 24 months   Guideline developer: UpToDate (See UpToDate for funding source) Date Released: 2014       Wellness Office Visit  Subjective:  Patient ID: Christine Adams, female    DOB: 10/02/1980  Age: 40 y.o. MRN: [id number]  CC: This 40 year old lady comes to our practice for wellness.  She is a Adult nurse. HPI  She has been struggling to lose weight and wants to become healthier. She also has hypothyroidism and is on levothyroxine.  She describes fatigue as well as inability to lose weight in a consistent manner. Past Medical History:  Diagnosis Date   ADHD    Asthma    BMI 30.0-30.9,adult    Hypothyroidism    Age 40 yrs   Past Surgical History:  Procedure Laterality Date   WISDOM TOOTH EXTRACTION       Family History  Problem Relation Age of Onset   Clotting disorder Mother    Heart disease Father    Sudden Cardiac Death Father    Hypertension Father    Hyperlipidemia Father    ADD / ADHD Sister    Obesity Sister    Diabetes Maternal Grandmother    Mesothelioma Maternal Grandmother    Alcohol abuse Paternal Grandmother    Hypertension Paternal Grandfather     Social History   Social History Narrative   Married since 2018.Lives with husband and son.   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   Social History   Tobacco Use   Smoking status: Never   Smokeless tobacco: Never  Substance Use Topics   Alcohol use: Yes    Comment: occasional    Current Meds  Medication Sig   albuterol (PROVENTIL HFA;VENTOLIN HFA) 108 (90 Base) MCG/ACT inhaler Inhale into the lungs every 6 (six) hours as needed.   beclomethasone (QVAR) 80 MCG/ACT inhaler Inhale 1 puff into the lungs daily.   cetirizine (ZYRTEC) 10 MG tablet Take 10 mg by mouth daily.    cholecalciferol (VITAMIN D) 1000 units tablet Take 1,000 Units by mouth daily.   hydrocortisone 2.5 % cream APPLY TOPICALLY 2 TIMES DAILY AS NEEDED (AREAS ON CHIN) FOR 2 WEEKS.   ibuprofen (ADVIL) 600 MG tablet Take 1 tablet (600 mg total) by mouth every 6 (six) hours as needed for mild pain, moderate pain or cramping.   levothyroxine (SYNTHROID) 137 MCG tablet Take 1 tablet by mouth every morning on an empty stomach with water only.  No food or other medications for 30 minutes.   Multiple Vitamin (MULTIVITAMIN) tablet Take 1 tablet by mouth daily.   NP THYROID 90 MG tablet Take 1 tablet (90 mg total) by mouth daily.   sertraline (ZOLOFT) 100 MG tablet Take  1 tablet daily for anxiety/OCD (Patient taking differently: 75 mg.)   triamcinolone (KENALOG) 0.1 % APPLY SMALL AMOUNT TO EARS 2 TIMES A DAY AS NEEDED. AVOID APPLYING TO FACE, GROIN, AND AXILLA.   valACYclovir (VALTREX) 500 MG tablet Take by mouth as needed.     [place] Office Visit from 12/22/2020 in [facility]  PHQ-9 Total Score 10       Objective:   Today's Vitals: BP 140/89 (BP Location: Right Arm, Patient Position: Sitting, Cuff Size: Normal)   Pulse 89   Temp (!) 97.5 F (36.4 C) (Temporal)   Resp  18   Ht 5\' 5"  (1.651 m)   Wt 200 lb 3.2 oz (90.8 kg)   LMP 11/27/2020   SpO2 99%   BMI 33.32 kg/m  Vitals with BMI 12/22/2020 12/09/2020 02/14/2020  Height 5\' 5"  5\' 6"  5\' 6"   Weight 200 lbs 3 oz 196 lbs 193 lbs 4 oz  BMI 33.31 37.48 27.07  Systolic [phone]  Diastolic 89 62 74  Pulse 89 97 76     Physical Exam She looks systemically well.  Blood pressure elevated today but previously has been normal.  She is obese.      Assessment   1. Acquired hypothyroidism   2. Obesity (BMI 30-39.9)       Tests ordered No orders of the defined types were placed in this encounter.    Plan: 1.  We discussed nutrition at length and I introduced the concept of intermittent fasting combined with a  plant-based diet, ensuring adequate hydration and salt intake.  I have given her diet sheet. 2.  We discussed her hypothyroidism and the rationale to use a combination of T3 and T4.  After shared decision making, she is agreeable to try desiccated NP thyroid and I have sent a prescription to her pharmacy.  I explained possible side effects and how to deal with them. 3.  I will see her in about 6 to 8 weeks time to see how she is doing with further recommendations at that point.  I spent 1 hour with this patient reviewing her medical records and discussing nutrition as well as hypothyroidism.    Meds ordered this encounter  Medications   NP THYROID 90 MG tablet    Sig: Take 1 tablet (90 mg total) by mouth daily.    Dispense:  30 tablet    Refill:  3     Sasha Rueth Luther Parody, MD

## 2020-12-22 NOTE — Patient Instructions (Signed)
[facility] Dietary Recommendations for Weight Loss What to Avoid Avoid added sugars Often added sugar can be found in processed foods such as many condiments, dry cereals, cakes, cookies, chips, crisps, crackers, candies, sweetened drinks, etc.  Read labels and AVOID/DECREASE use of foods with the following in their ingredient list: Sugar, fructose, high fructose corn syrup, sucrose, glucose, maltose, dextrose, molasses, cane sugar, brown sugar, any type of syrup, agave nectar, etc.   Avoid snacking in between meals Avoid foods made with flour If you are going to eat food made with flour, choose those made with whole-grains; and, minimize your consumption as much as is tolerable Avoid processed foods These foods are generally stocked in the middle of the grocery store. Focus on shopping on the perimeter of the grocery.  Avoid Meat  We recommend following a plant-based diet at [facility]. Thus, we recommend avoiding meat as a general rule. Consider eating beans, legumes, eggs, and/or dairy products for regular protein sources If you plan on eating meat limit to 4 ounces of meat at a time and choose lean options such as Fish, chicken, [place]. Avoid red meat intake such as pork and/or steak What to Include Vegetables GREEN LEAFY VEGETABLES: Kale, spinach, mustard greens, collard greens, cabbage, broccoli, etc. OTHER: Asparagus, cauliflower, eggplant, carrots, peas, Brussel sprouts, tomatoes, bell peppers, zucchini, beets, cucumbers, etc. Grains, seeds, and legumes Beans: kidney beans, black eyed peas, garbanzo beans, black beans, pinto beans, etc. Whole, unrefined grains: brown rice, barley, bulgur, oatmeal, etc. Healthy fats  Avoid highly processed fats such as vegetable oil Examples of healthy fats: avocado, olives, virgin olive oil, dark chocolate (?72% Cocoa), nuts (peanuts, almonds, walnuts, cashews, pecans, etc.) None to Low Intake of Animal Sources of Protein Meat  sources: chicken, [place], salmon, tuna. Limit to 4 ounces of meat at one time. Consider limiting dairy sources, but when choosing dairy focus on: PLAIN [place] yogurt, cottage cheese, high-protein milk Fruit Choose berries  When to Eat Intermittent Fasting: Choosing not to eat for a specific time period, but DO FOCUS ON HYDRATION when fasting Multiple Techniques: Time Restricted Eating: eat 3 meals in a day, each meal lasting no more than 60 minutes, no snacks between meals 16-18 hour fast: fast for 16 to 18 hours up to 7 days a week. Often suggested to start with 2-3 nonconsecutive days per week.  Remember the time you sleep is counted as fasting.  Examples of eating schedule: Fast from 7:00pm-11:00am. Eat between 11:00am-7:00pm.  24-hour fast: fast for 24 hours up to every other day. Often suggested to start with 1 day per week Remember the time you sleep is counted as fasting Examples of eating schedule:  Eating day: eat 2-3 meals on your eating day. If doing 2 meals, each meal should last no more than 90 minutes. If doing 3 meals, each meal should last no more than 60 minutes. Finish last meal by 7:00pm. Fasting day: Fast until 7:00pm.  IF YOU FEEL UNWELL FOR ANY REASON/IN ANY WAY WHEN FASTING, STOP FASTING BY EATING A NUTRITIOUS SNACK OR LIGHT MEAL ALWAYS FOCUS ON HYDRATION DURING FASTS Acceptable Hydration sources: water, broths, tea/coffee (black tea/coffee is best but using a small amount of whole-fat dairy products in coffee/tea is acceptable).  Poor Hydration Sources: anything with sugar or artificial sweeteners added to it  These recommendations have been developed for patients that are actively receiving medical care from either Dr. Anastasio Champion or Jeralyn Ruths, DNP, NP-C at [facility]. These recommendations  are developed for patients with specific medical conditions and are not meant to be distributed or used by others that are not actively receiving care from either provider  listed above at [facility]. It is not appropriate to participate in the above eating plans without proper medical supervision.   Reference: Rexanne Mano. The obesity code. [place]; 2016.

## 2020-12-23 ENCOUNTER — Other Ambulatory Visit: Payer: Self-pay

## 2020-12-31 ENCOUNTER — Other Ambulatory Visit: Payer: Self-pay

## 2021-01-07 ENCOUNTER — Other Ambulatory Visit: Payer: Self-pay

## 2021-01-07 DIAGNOSIS — F4322 Adjustment disorder with anxiety: Secondary | ICD-10-CM | POA: Diagnosis not present

## 2021-01-07 DIAGNOSIS — F902 Attention-deficit hyperactivity disorder, combined type: Secondary | ICD-10-CM | POA: Diagnosis not present

## 2021-01-07 MED ORDER — SERTRALINE HCL 50 MG PO TABS
ORAL_TABLET | ORAL | 2 refills | Status: DC
  Filled 2021-01-07 – 2021-04-06 (×2): qty 45, 30d supply, fill #0
  Filled 2021-06-08: qty 45, 30d supply, fill #1

## 2021-01-19 ENCOUNTER — Other Ambulatory Visit: Payer: Self-pay

## 2021-02-01 ENCOUNTER — Other Ambulatory Visit: Payer: Self-pay

## 2021-02-15 ENCOUNTER — Ambulatory Visit (INDEPENDENT_AMBULATORY_CARE_PROVIDER_SITE_OTHER): Payer: 59 | Admitting: Internal Medicine

## 2021-02-25 DIAGNOSIS — O039 Complete or unspecified spontaneous abortion without complication: Secondary | ICD-10-CM | POA: Diagnosis not present

## 2021-02-25 DIAGNOSIS — O200 Threatened abortion: Secondary | ICD-10-CM | POA: Diagnosis not present

## 2021-02-26 ENCOUNTER — Other Ambulatory Visit: Payer: Self-pay

## 2021-03-22 ENCOUNTER — Other Ambulatory Visit: Payer: Self-pay

## 2021-03-22 DIAGNOSIS — F902 Attention-deficit hyperactivity disorder, combined type: Secondary | ICD-10-CM | POA: Diagnosis not present

## 2021-03-22 DIAGNOSIS — F4322 Adjustment disorder with anxiety: Secondary | ICD-10-CM | POA: Diagnosis not present

## 2021-03-22 MED ORDER — SERTRALINE HCL 50 MG PO TABS
ORAL_TABLET | ORAL | 2 refills | Status: DC
  Filled 2021-03-22: qty 45, 30d supply, fill #0

## 2021-04-02 ENCOUNTER — Other Ambulatory Visit: Payer: Self-pay

## 2021-04-06 ENCOUNTER — Other Ambulatory Visit: Payer: Self-pay

## 2021-04-15 ENCOUNTER — Ambulatory Visit: Payer: 59 | Attending: Internal Medicine

## 2021-04-15 ENCOUNTER — Other Ambulatory Visit: Payer: Self-pay

## 2021-04-15 DIAGNOSIS — Z23 Encounter for immunization: Secondary | ICD-10-CM

## 2021-04-15 MED ORDER — PFIZER COVID-19 VAC BIVALENT 30 MCG/0.3ML IM SUSP
INTRAMUSCULAR | 0 refills | Status: DC
  Filled 2021-04-15: qty 0.3, 1d supply, fill #0

## 2021-04-15 NOTE — Progress Notes (Signed)
   Covid-19 Vaccination Clinic  Name:  Sehar Sedano    MRN: [id number] DOB: June 12, 1981  04/15/2021  Ms. Hack was observed post Covid-19 immunization for 15 minutes without incident. She was provided with Vaccine Information Sheet and instruction to access the V-Safe system.   Ms. Vanderslice was instructed to call 911 with any severe reactions post vaccine: Difficulty breathing  Swelling of face and throat  A fast heartbeat  A bad rash all over body  Dizziness and weakness   Immunizations Administered     Name Date Dose VIS Date Route   Pfizer Covid-19 Vaccine Bivalent Booster 04/15/2021 12:22 PM 0.3 mL 02/24/2021 Intramuscular   Manufacturer: [place]   Lot: [place]: [id number]       Covid-19 Vaccination Clinic  Name:  Avea Mcgowen    MRN: [id number] DOB: [birth year]  04/15/2021  Ms. Fackler was observed post Covid-19 immunization for 15 minutes without incident. She was provided with Vaccine Information Sheet and instruction to access the V-Safe system.   Ms. Firman was instructed to call 911 with any severe reactions post vaccine: Difficulty breathing  Swelling of face and throat  A fast heartbeat  A bad rash all over body  Dizziness and weakness   Immunizations Administered     Name Date Dose VIS Date Route   Pfizer Covid-19 Vaccine Bivalent Booster 04/15/2021 12:22 PM 0.3 mL 02/24/2021 Intramuscular   Manufacturer: [place]   Lot: [id number]   [place]: [phone]

## 2021-05-25 ENCOUNTER — Other Ambulatory Visit: Payer: Self-pay | Admitting: Primary Care

## 2021-05-25 ENCOUNTER — Other Ambulatory Visit: Payer: Self-pay

## 2021-05-25 DIAGNOSIS — E039 Hypothyroidism, unspecified: Secondary | ICD-10-CM

## 2021-05-27 ENCOUNTER — Other Ambulatory Visit: Payer: Self-pay

## 2021-05-27 MED ORDER — LEVOTHYROXINE SODIUM 137 MCG PO TABS
ORAL_TABLET | ORAL | 1 refills | Status: DC
  Filled 2021-05-27: qty 90, 90d supply, fill #0
  Filled 2021-09-08: qty 90, 90d supply, fill #1

## 2021-05-27 NOTE — Telephone Encounter (Signed)
Disregard, patient responded.

## 2021-05-27 NOTE — Telephone Encounter (Signed)
Please call patient.  She hasn't read her my chart message.

## 2021-05-28 ENCOUNTER — Other Ambulatory Visit: Payer: Self-pay

## 2021-05-28 DIAGNOSIS — M545 Low back pain, unspecified: Secondary | ICD-10-CM | POA: Diagnosis not present

## 2021-05-28 DIAGNOSIS — M5442 Lumbago with sciatica, left side: Secondary | ICD-10-CM | POA: Diagnosis not present

## 2021-05-28 DIAGNOSIS — M5416 Radiculopathy, lumbar region: Secondary | ICD-10-CM | POA: Diagnosis not present

## 2021-05-28 DIAGNOSIS — M5441 Lumbago with sciatica, right side: Secondary | ICD-10-CM | POA: Diagnosis not present

## 2021-05-28 DIAGNOSIS — G8929 Other chronic pain: Secondary | ICD-10-CM | POA: Diagnosis not present

## 2021-05-28 MED ORDER — METHYLPREDNISOLONE 4 MG PO TBPK
ORAL_TABLET | ORAL | 0 refills | Status: DC
  Filled 2021-05-28: qty 21, 6d supply, fill #0

## 2021-05-28 MED ORDER — BACLOFEN 10 MG PO TABS
ORAL_TABLET | ORAL | 0 refills | Status: DC
  Filled 2021-05-28: qty 30, 15d supply, fill #0

## 2021-05-31 ENCOUNTER — Other Ambulatory Visit: Payer: Self-pay | Admitting: Neurosurgery

## 2021-05-31 DIAGNOSIS — G8929 Other chronic pain: Secondary | ICD-10-CM

## 2021-05-31 DIAGNOSIS — M5416 Radiculopathy, lumbar region: Secondary | ICD-10-CM

## 2021-05-31 DIAGNOSIS — M5442 Lumbago with sciatica, left side: Secondary | ICD-10-CM

## 2021-06-01 ENCOUNTER — Encounter: Payer: Self-pay | Admitting: Primary Care

## 2021-06-01 ENCOUNTER — Other Ambulatory Visit: Payer: Self-pay

## 2021-06-01 ENCOUNTER — Telehealth (INDEPENDENT_AMBULATORY_CARE_PROVIDER_SITE_OTHER): Payer: 59 | Admitting: Primary Care

## 2021-06-01 DIAGNOSIS — M545 Low back pain, unspecified: Secondary | ICD-10-CM | POA: Insufficient documentation

## 2021-06-01 DIAGNOSIS — G8929 Other chronic pain: Secondary | ICD-10-CM | POA: Diagnosis not present

## 2021-06-01 NOTE — Patient Instructions (Signed)
Continue Solu-Medrol daily and Baclofen as needed.  Complete Physical therapy as prescribed.   Please reach out if you need anything!  Allie Bossier, NP-C

## 2021-06-01 NOTE — Assessment & Plan Note (Signed)
Chronic for years.  Following with neurosurgery and soon to be PT.  Continue Solu-Medrol and Baclofen. Discussed gradually increasing activity as tolerated.  She will reach out if anything is needed from our end.

## 2021-06-01 NOTE — Progress Notes (Signed)
Patient ID: Christine Adams, female    DOB: 1981/01/21, 40 y.o.   MRN: [id number]  Virtual visit completed through [place], a video enabled telemedicine application. Due to national recommendations of social distancing due to COVID-19, a virtual visit is felt to be most appropriate for this patient at this time. Reviewed limitations, risks, security and privacy concerns of performing a virtual visit and the availability of in person appointments. I also reviewed that there may be a patient responsible charge related to this service. The patient agreed to proceed.   Patient location: home Provider location: [place] at [facility], office Persons participating in this virtual visit: patient, provider   If any vitals were documented, they were collected by patient at home unless specified below.    Ht 5\' 5"  (1.651 m)   Wt 190 lb (86.2 kg)   BMI 31.62 kg/m    CC: Back Pain Subjective:   HPI: Christine Adams is a 40 y.o. female with a history of hypothyroidism, chronic back pain, asthma, fatigue presenting on 06/01/2021 to discuss back pain.  Her pain is located to the which began about 6 days ago.   Long history of midline lower back pain that dates back to her 20's, mostly back spasms that were overall tolerable. History of "herniated disc with pinched nerve", declined surgery at the time. Since post partum April 2021, has had increased intensity and duration of midline, intermittent lower back pain with back spasms.   Last week she developed an increased intensity if lower back pain with spasms. Evaluated by Urgent Care. Urgent Care completed xrays, provided Toradol IM, and prescribed Baclofen and oral Solu-Medrol.  She was evaluated by her Neurosurgeon later that day who recommended MRI of lumbar spine which is not scheduled at this time, and physical therapy. Since she's compliant to her Solu-Medrol daily and Baclofen PRN. Today she's feeling slightly better, has been bed bound mostly  since last week.   She denies radiculopathy, lower extremity numbness/tingling, loss of bowel/bladder control.       Relevant past medical, surgical, family and social history reviewed and updated as indicated. Interim medical history since our last visit reviewed. Allergies and medications reviewed and updated. Outpatient Medications Prior to Visit  Medication Sig Dispense Refill   baclofen (LIORESAL) 10 MG tablet Take 1 tablet twice a day by oral route as needed for 15 days. 30 tablet 0   cholecalciferol (VITAMIN D) 1000 units tablet Take 1,000 Units by mouth daily.     ibuprofen (ADVIL) 600 MG tablet Take 1 tablet (600 mg total) by mouth every 6 (six) hours as needed for mild pain, moderate pain or cramping. 60 tablet 0   levothyroxine (SYNTHROID) 137 MCG tablet Take 1 tablet by mouth every morning on an empty stomach with water only.  No food or other medications for 30 minutes. 90 tablet 1   Multiple Vitamin (MULTIVITAMIN) tablet Take 1 tablet by mouth daily.     sertraline (ZOLOFT) 100 MG tablet Take  1 tablet daily for anxiety/OCD (Patient taking differently: 75 mg.) 30 tablet 2   albuterol (PROVENTIL HFA;VENTOLIN HFA) 108 (90 Base) MCG/ACT inhaler Inhale into the lungs every 6 (six) hours as needed. (Patient not taking: Reported on 06/01/2021)     beclomethasone (QVAR) 80 MCG/ACT inhaler Inhale 1 puff into the lungs daily. (Patient not taking: Reported on 06/01/2021)     cetirizine (ZYRTEC) 10 MG tablet Take 10 mg by mouth daily. (Patient not taking: Reported on 06/01/2021)  COVID-19 mRNA bivalent vaccine, Pfizer, (PFIZER COVID-19 VAC BIVALENT) injection Inject into the muscle. (Patient not taking: Reported on 06/01/2021) 0.3 mL 0   hydrocortisone 2.5 % cream APPLY TOPICALLY 2 TIMES DAILY AS NEEDED (AREAS ON CHIN) FOR 2 WEEKS. (Patient not taking: Reported on 06/01/2021) 28 g 1   methylPREDNISolone (MEDROL) 4 MG TBPK tablet Take 1 dose pk by oral route. (Patient not taking: Reported on  06/01/2021) 21 tablet 0   sertraline (ZOLOFT) 50 MG tablet Take  1.5 tablets daily for anxiety/OCD (Patient not taking: Reported on 06/01/2021) 45 tablet 2   sertraline (ZOLOFT) 50 MG tablet Take  1.5 tablets daily for anxiety/OCD (Patient not taking: Reported on 06/01/2021) 45 tablet 2   triamcinolone (KENALOG) 0.1 % APPLY SMALL AMOUNT TO EARS 2 TIMES A DAY AS NEEDED. AVOID APPLYING TO FACE, GROIN, AND AXILLA. (Patient not taking: Reported on 06/01/2021) 80 g 2   valACYclovir (VALTREX) 500 MG tablet Take by mouth as needed. (Patient not taking: Reported on 06/01/2021)     No facility-administered medications prior to visit.     Per HPI unless specifically indicated in ROS section below Review of Systems  Musculoskeletal:  Positive for arthralgias and myalgias.  Neurological:  Negative for weakness and numbness.  Objective:  Ht 5\' 5"  (1.651 m)   Wt 190 lb (86.2 kg)   BMI 31.62 kg/m   Wt Readings from Last 3 Encounters:  06/01/21 190 lb (86.2 kg)  12/22/20 200 lb 3.2 oz (90.8 kg)  12/09/20 196 lb (88.9 kg)       Physical exam: General: Alert and oriented x 3, no distress, does not appear sickly  Pulmonary: Speaks in complete sentences without increased work of breathing, no cough during visit.  Psychiatric: Normal mood, thought content, and behavior.     Results for orders placed or performed in visit on 12/09/20  Comprehensive metabolic panel  Result Value Ref Range   Sodium 138 135 - 145 mEq/L   Potassium 4.3 3.5 - 5.1 mEq/L   Chloride 105 96 - 112 mEq/L   CO2 26 19 - 32 mEq/L   Glucose, Bld 81 70 - 99 mg/dL   BUN 12 6 - 23 mg/dL   Creatinine, Ser 0.67 0.40 - 1.20 mg/dL   Total Bilirubin 0.6 0.2 - 1.2 mg/dL   Alkaline Phosphatase 97 39 - 117 U/L   AST 17 0 - 37 U/L   ALT 19 0 - 35 U/L   Total Protein 7.2 6.0 - 8.3 g/dL   Albumin 4.5 3.5 - 5.2 g/dL   GFR 110.56 >60.00 mL/min   Calcium 9.0 8.4 - 10.5 mg/dL  VITAMIN D 25 Hydroxy (Vit-D Deficiency, Fractures)  Result Value  Ref Range   VITD 51.25 30.00 - 100.00 ng/mL  Vitamin B12  Result Value Ref Range   Vitamin B-12 591 211 - 911 pg/mL  CBC  Result Value Ref Range   WBC 7.2 4.0 - 10.5 K/uL   RBC 5.10 3.87 - 5.11 Mil/uL   Platelets 228.0 150.0 - 400.0 K/uL   Hemoglobin 14.9 12.0 - 15.0 g/dL   HCT 43.7 36.0 - 46.0 %   MCV 85.7 78.0 - 100.0 fl   MCHC 34.1 30.0 - 36.0 g/dL   RDW 13.3 11.5 - 15.5 %  TSH  Result Value Ref Range   TSH 4.20 0.35 - 4.50 uIU/mL  IBC + Ferritin  Result Value Ref Range   Iron 87 42 - 145 ug/dL   Transferrin 223.0 212.0 -  360.0 mg/dL   Saturation Ratios 27.9 20.0 - 50.0 %   Ferritin 54.9 10.0 - 291.0 ng/mL   Assessment & Plan:   Problem List Items Addressed This Visit       Other   Chronic low back pain    Chronic for years.  Following with neurosurgery and soon to be PT.  Continue Solu-Medrol and Baclofen. Discussed gradually increasing activity as tolerated.  She will reach out if anything is needed from our end.        No orders of the defined types were placed in this encounter.  No orders of the defined types were placed in this encounter.   I discussed the assessment and treatment plan with the patient. The patient was provided an opportunity to ask questions and all were answered. The patient agreed with the plan and demonstrated an understanding of the instructions. The patient was advised to call back or seek an in-person evaluation if the symptoms worsen or if the condition fails to improve as anticipated.  Follow up plan:  Continue Solu-Medrol daily and Baclofen as needed.  Complete Physical therapy as prescribed.  Complete your MRI  Please reach out if you need anything!  Allie Bossier, NP-C   Pleas Koch, NP

## 2021-06-07 ENCOUNTER — Other Ambulatory Visit: Payer: Self-pay

## 2021-06-07 MED ORDER — METHYLPREDNISOLONE 4 MG PO TBPK
ORAL_TABLET | ORAL | 0 refills | Status: DC
  Filled 2021-06-07: qty 21, 6d supply, fill #0

## 2021-06-08 ENCOUNTER — Other Ambulatory Visit: Payer: Self-pay

## 2021-06-11 ENCOUNTER — Other Ambulatory Visit: Payer: Self-pay

## 2021-06-11 DIAGNOSIS — F902 Attention-deficit hyperactivity disorder, combined type: Secondary | ICD-10-CM | POA: Diagnosis not present

## 2021-06-11 DIAGNOSIS — F4322 Adjustment disorder with anxiety: Secondary | ICD-10-CM | POA: Diagnosis not present

## 2021-06-11 MED ORDER — SERTRALINE HCL 50 MG PO TABS
ORAL_TABLET | ORAL | 2 refills | Status: DC
  Filled 2021-06-11 – 2021-08-26 (×3): qty 45, 30d supply, fill #0
  Filled 2022-01-18: qty 45, 30d supply, fill #1

## 2021-06-12 ENCOUNTER — Ambulatory Visit
Admission: RE | Admit: 2021-06-12 | Discharge: 2021-06-12 | Disposition: A | Discharge status: Home / Self Care | Payer: 59 | Source: Ambulatory Visit | Attending: Neurosurgery | Admitting: Neurosurgery

## 2021-06-12 ENCOUNTER — Other Ambulatory Visit: Payer: Self-pay

## 2021-06-12 DIAGNOSIS — M5441 Lumbago with sciatica, right side: Secondary | ICD-10-CM | POA: Insufficient documentation

## 2021-06-12 DIAGNOSIS — G8929 Other chronic pain: Secondary | ICD-10-CM | POA: Diagnosis not present

## 2021-06-12 DIAGNOSIS — M5416 Radiculopathy, lumbar region: Secondary | ICD-10-CM | POA: Insufficient documentation

## 2021-06-12 DIAGNOSIS — M5442 Lumbago with sciatica, left side: Secondary | ICD-10-CM | POA: Diagnosis not present

## 2021-06-12 DIAGNOSIS — M545 Low back pain, unspecified: Secondary | ICD-10-CM | POA: Diagnosis not present

## 2021-07-15 ENCOUNTER — Other Ambulatory Visit (HOSPITAL_COMMUNITY): Payer: Self-pay

## 2021-07-15 MED FILL — Hydrocortisone Cream 2.5%: CUTANEOUS | 14 days supply | Qty: 28 | Fill #0 | Status: CN

## 2021-07-19 ENCOUNTER — Other Ambulatory Visit (HOSPITAL_COMMUNITY): Payer: Self-pay

## 2021-07-23 ENCOUNTER — Other Ambulatory Visit (HOSPITAL_COMMUNITY): Payer: Self-pay

## 2021-07-23 MED FILL — Hydrocortisone Cream 2.5%: CUTANEOUS | 30 days supply | Qty: 28 | Fill #0 | Status: AC

## 2021-07-29 ENCOUNTER — Other Ambulatory Visit: Payer: Self-pay

## 2021-07-29 ENCOUNTER — Encounter: Payer: Self-pay | Admitting: Dermatology

## 2021-07-29 ENCOUNTER — Ambulatory Visit (INDEPENDENT_AMBULATORY_CARE_PROVIDER_SITE_OTHER): Payer: 59 | Admitting: Dermatology

## 2021-07-29 DIAGNOSIS — L821 Other seborrheic keratosis: Secondary | ICD-10-CM | POA: Diagnosis not present

## 2021-07-29 DIAGNOSIS — L719 Rosacea, unspecified: Secondary | ICD-10-CM

## 2021-07-29 DIAGNOSIS — D229 Melanocytic nevi, unspecified: Secondary | ICD-10-CM | POA: Diagnosis not present

## 2021-07-29 DIAGNOSIS — D2361 Other benign neoplasm of skin of right upper limb, including shoulder: Secondary | ICD-10-CM

## 2021-07-29 DIAGNOSIS — D1800 Hemangioma unspecified site: Secondary | ICD-10-CM

## 2021-07-29 DIAGNOSIS — L578 Other skin changes due to chronic exposure to nonionizing radiation: Secondary | ICD-10-CM

## 2021-07-29 DIAGNOSIS — L814 Other melanin hyperpigmentation: Secondary | ICD-10-CM | POA: Diagnosis not present

## 2021-07-29 DIAGNOSIS — Z1283 Encounter for screening for malignant neoplasm of skin: Secondary | ICD-10-CM | POA: Diagnosis not present

## 2021-07-29 DIAGNOSIS — D2362 Other benign neoplasm of skin of left upper limb, including shoulder: Secondary | ICD-10-CM | POA: Diagnosis not present

## 2021-07-29 DIAGNOSIS — Z808 Family history of malignant neoplasm of other organs or systems: Secondary | ICD-10-CM

## 2021-07-29 DIAGNOSIS — Z0289 Encounter for other administrative examinations: Secondary | ICD-10-CM

## 2021-07-29 MED ORDER — DOXYCYCLINE 40 MG PO CPDR
40.0000 mg | DELAYED_RELEASE_CAPSULE | Freq: Every day | ORAL | 5 refills | Status: DC
  Filled 2021-07-29: qty 30, 30d supply, fill #0

## 2021-07-29 NOTE — Progress Notes (Signed)
Follow-Up Visit   Subjective  Christine Adams is a 41 y.o. female who presents for the following: Annual Exam (Here for skin cancer screening. Full body. No hx of DN or personal hx of skin cancer. Mother has hx of SCC's).  The patient presents for Total-Body Skin Exam (TBSE) for skin cancer screening and mole check.  The patient has spots, moles and lesions to be evaluated, some may be new or changing and the patient has concerns that these could be cancer.  The following portions of the chart were reviewed this encounter and updated as appropriate:  Tobacco   Allergies   Meds   Problems   Med Hx   Surg Hx   Fam Hx       Review of Systems: No other skin or systemic complaints except as noted in HPI or Assessment and Plan.   Objective  Well appearing patient in no apparent distress; mood and affect are within normal limits.  A full examination was performed including scalp, head, eyes, ears, nose, lips, neck, chest, axillae, abdomen, back, buttocks, bilateral upper extremities, bilateral lower extremities, hands, feet, fingers, toes, fingernails, and toenails. All findings within normal limits unless otherwise noted below.  Head - Anterior (Face) Mid face erythema with telangiectasias with few inflammatory papules.    Assessment & Plan   Lentigines - Scattered tan macules - Due to sun exposure - Benign-appearing, observe - Recommend daily broad spectrum sunscreen SPF 30+ to sun-exposed areas, reapply every 2 hours as needed. - Call for any changes  Seborrheic Keratoses - Stuck-on, waxy, tan-brown papules and/or plaques  - Benign-appearing - Discussed benign etiology and prognosis. - Observe - Call for any changes  Melanocytic Nevi - Tan-brown and/or pink-flesh-colored symmetric macules and papules - Benign appearing on exam today - Observation - Call clinic for new or changing moles - Recommend daily use of broad spectrum spf 30+ sunscreen to sun-exposed areas.    Hemangiomas - Red papules - Discussed benign nature - Observe - Call for any changes  Actinic Damage - Chronic condition, secondary to cumulative UV/sun exposure - diffuse scaly erythematous macules with underlying dyspigmentation - Recommend daily broad spectrum sunscreen SPF 30+ to sun-exposed areas, reapply every 2 hours as needed.  - Staying in the shade or wearing long sleeves, sun glasses (UVA+UVB protection) and wide brim hats (4-inch brim around the entire circumference of the hat) are also recommended for sun protection.  - Call for new or changing lesions.  Dermatofibroma - Firm pink/brown papulenodule with dimple sign at bilateral arms - Benign appearing - Call for any changes   Skin cancer screening performed today.  Rosacea Head - Anterior (Face)  Chronic condition with duration over one year. Condition is bothersome to patient. Currently flared.  Rosacea is a chronic progressive skin condition usually affecting the face of adults, causing redness and/or acne bumps. It is treatable but not curable. It sometimes affects the eyes (ocular rosacea) as well. It may respond to topical and/or systemic medication and can flare with stress, sun exposure, alcohol, exercise and some foods.  Daily application of broad spectrum spf 30+ sunscreen to face is recommended to reduce flares.  Continue [facility] metronidazole/ivermectin/azelaic acid twice daily as needed to affected areas on the face. The patient was advised this is not covered by insurance since it is made by a compounding pharmacy. They will receive an email to check out and the medication will be mailed to their home.   Start Oracea 40mg  once  daily with food.  Doxycycline should be taken with food to prevent nausea. Do not lay down for 30 minutes after taking. Be cautious with sun exposure and use good sun protection while on this medication. Pregnant women should not take this medication.    Recommend  [facility] Hypochlorous Spray (found in the wound care section) OR Cln brand Acne or Sports wash. The [facility] Hypochlorous Spray can be sprayed on daily and left on. The Cln wash should be applied to the affected area daily for at least 30 seconds and then rinsed off. If you are using clindamycin solution or lotion or another topical antibiotic to treat acne, using a hypochlorous product may help lower the risk of antibiotic resistant bacteria.    Start prescription tretinion 0.025 % with hyaluronic acid sent through [facility]. Advised to use cautiously since sometimes can flare rosacea but she has tolerated in the past.   Topical retinoid medications like tretinoin/Retin-A, adapalene/Differin, tazarotene/Fabior, and Epiduo/Epiduo Forte can cause dryness and irritation when first started. Only apply a pea-sized amount to the entire affected area. Avoid applying it around the eyes, edges of mouth and creases at the nose. If you experience irritation, use a good moisturizer first and/or apply the medicine less often. If you are doing well with the medicine, you can increase how often you use it until you are applying every night. Be careful with sun protection while using this medication as it can make you sensitive to the sun. This medicine should not be used by pregnant women.   doxycycline (ORACEA) 40 MG capsule - Head - Anterior (Face) Take 1 capsule (40 mg total) by mouth daily. Take with food   Return in about 1 year (around 07/29/2022) for TBSE.  I, Emelia Salisbury, CMA, am acting as scribe for Forest Gleason, MD.  Documentation: I have reviewed the above documentation for accuracy and completeness, and I agree with the above.  Forest Gleason, MD

## 2021-07-29 NOTE — Patient Instructions (Addendum)
Recommend taking Heliocare sun protection supplement daily in sunny weather for additional sun protection. For maximum protection on the sunniest days, you can take up to 2 capsules of regular Heliocare OR take 1 capsule of Heliocare Ultra. For prolonged exposure (such as a full day in the sun), you can repeat your dose of the supplement 4 hours after your first dose. Heliocare can be purchased at [employer], at some [facility] or at [email or web address].    Recommend [facility] Hypochlorous Spray (found in the wound care section) OR Cln brand Acne or Sports wash. The [facility] Hypochlorous Spray can be sprayed on daily and left on. The Cln wash should be applied to the affected area daily for at least 30 seconds and then rinsed off. If you are using clindamycin solution or lotion or another topical antibiotic to treat acne, using a hypochlorous product may help lower the risk of antibiotic resistant bacteria.    Doxycycline should be taken with food to prevent nausea. Do not lay down for 30 minutes after taking. Be cautious with sun exposure and use good sun protection while on this medication. Pregnant women should not take this medication.    Instructions for [facility] Medications  One or more of your medications was sent to the [facility] mail order compounding pharmacy. You will receive an email from them and can purchase the medicine through that link. It will then be mailed to your home at the address you confirmed. If for any reason you do not receive an email from them, please check your spam folder. If you still do not find the email, please let us know. [facility] phone number is [phone].   Topical retinoid medications like tretinoin/Retin-A, adapalene/Differin, tazarotene/Fabior, and Epiduo/Epiduo Forte can cause dryness and irritation when first started. Only apply a pea-sized amount to the entire affected area. Avoid applying it around the eyes, edges of mouth and  creases at the nose. If you experience irritation, use a good moisturizer first and/or apply the medicine less often. If you are doing well with the medicine, you can increase how often you use it until you are applying every night. Be careful with sun protection while using this medication as it can make you sensitive to the sun. This medicine should not be used by pregnant women.    Melanoma ABCDEs  Melanoma is the most dangerous type of skin cancer, and is the leading cause of death from skin disease.  You are more likely to develop melanoma if you: Have light-colored skin, light-colored eyes, or red or blond hair Spend a lot of time in the sun Tan regularly, either outdoors or in a tanning bed Have had blistering sunburns, especially during childhood Have a close family member who has had a melanoma Have atypical moles or large birthmarks  Early detection of melanoma is key since treatment is typically straightforward and cure rates are extremely high if we catch it early.   The first sign of melanoma is often a change in a mole or a new dark spot.  The ABCDE system is a way of remembering the signs of melanoma.  A for asymmetry:  The two halves do not match. B for border:  The edges of the growth are irregular. C for color:  A mixture of colors are present instead of an even brown color. D for diameter:  Melanomas are usually (but not always) greater than 76mm - the size of a pencil eraser. E for evolution:  The spot  keeps changing in size, shape, and color.  Please check your skin once per month between visits. You can use a small mirror in front and a large mirror behind you to keep an eye on the back side or your body.   If you see any new or changing lesions before your next follow-up, please call to schedule a visit.  Please continue daily skin protection including broad spectrum sunscreen SPF 30+ to sun-exposed areas, reapplying every 2 hours as needed when you're outdoors.    Staying in the shade or wearing long sleeves, sun glasses (UVA+UVB protection) and wide brim hats (4-inch brim around the entire circumference of the hat) are also recommended for sun protection.    Some Recommended Sunscreens Include:  Good for Daily Wear (feels like lotion but NOT sweat resistant) Cerave AM Moisturizer with SPF EltaMD UV Lotion  Body or All Over Sunscreen EltaMD UV active for body and face Blue lizard sensitive Sun bum mineral (avoid if sensitive to scent) Aveeno Positively Mineral Neutrogena sheer zinc (Slightly harder to rub in) [facility] clear zinc (Slightly harder to rub in)  Clear Face Sunscreen EltaMD UV Elements CeraVe hydrating sunscreen 50 face  Tinted Face Sunscreen Alastin Hydratint (good for most skin tones, may be slightly dark if you are very fair) Colorescience Sunforgettable Total Protection Face Shield (good for most skin tones) EltaMD UV Physical La Roche Posay Mineral Tinted Cotz Flawless Complexion   Powder Sunscreen (Nice for reapplying or applying on the go) Colorescience Sunforgettable Total Protection Brush on Shield (available in different tints)  Face Sunscreen Available in Different Tints Colorescience Sunforgettable Total Protection Brush on Shield  bareMinerals Complexion Rescue Tinted Hydrating Gel Cream Broad Spectrum SPF 30 UnSun mineral tinted (comes in medium/dark and light/medium)  Kids (over 6 months) - Mineral Sunscreens Recommended eltaMD UV Pure MDsolarSciences KidStick 40 SPF Aveeno Baby Continuous Protection Sensitive Zinc Oxide Blue Lizard Kids mineral based sunscreen lotion Mustela Mineral Sunscreen for face and body Neutrogena Sheer Zinc Kids Sunscreen Stick  Tinted to look like a tan PCAskin sheer tint body spray  If You Need Anything After Your Visit  If you have any questions or concerns for your doctor, please call our main line at [phone] and press option 4 to reach your doctor's medical assistant.  If no one answers, please leave a voicemail as directed and we will return your call as soon as possible. Messages left after 4 pm will be answered the following business day.   You may also send [place] a message via [place]. We typically respond to MyChart messages within 1-2 business days.  For prescription refills, please ask your pharmacy to contact our office. Our fax number is [phone].  If you have an urgent issue when the clinic is closed that cannot wait until the next business day, you can page your doctor at the number below.    Please note that while we do our best to be available for urgent issues outside of office hours, we are not available 24/7.   If you have an urgent issue and are unable to reach [place], you may choose to seek medical care at your doctor's office, retail clinic, urgent care center, or emergency room.  If you have a medical emergency, please immediately call 911 or go to the emergency department.  Pager Numbers  - Dr. Nehemiah Massed: [phone]  - Dr. Laurence Ferrari: [phone]  - Dr. Nicole Kindred: [phone]  In the event of inclement weather, please call our main line at [phone]  for an update on the status of any delays or closures.  Dermatology Medication Tips: Please keep the boxes that topical medications come in in order to help keep track of the instructions about where and how to use these. Pharmacies typically print the medication instructions only on the boxes and not directly on the medication tubes.   If your medication is too expensive, please contact our office at [phone] option 4 or send [place] a message through [place].   We are unable to tell what your co-pay for medications will be in advance as this is different depending on your insurance coverage. However, we may be able to find a substitute medication at lower cost or fill out paperwork to get insurance to cover a needed medication.   If a prior authorization is required to get your medication  covered by your insurance company, please allow [place] 1-2 business days to complete this process.  Drug prices often vary depending on where the prescription is filled and some pharmacies may offer cheaper prices.  The website www.goodrx.com contains coupons for medications through different pharmacies. The prices here do not account for what the cost may be with help from insurance (it may be cheaper with your insurance), but the website can give you the price if you did not use any insurance.  - You can print the associated coupon and take it with your prescription to the pharmacy.  - You may also stop by our office during regular business hours and pick up a GoodRx coupon card.  - If you need your prescription sent electronically to a different pharmacy, notify our office through [facility] or by phone at [phone] option 4.     Si Usted Necesita Algo Despus de Su Visita  Tambin puede enviarnos un mensaje a travs de Pharmacist, community. Por lo general respondemos a los mensajes de MyChart en el transcurso de 1 a 2 das hbiles.  Para renovar recetas, por favor pida a su farmacia que se ponga en contacto con nuestra oficina. Harland Dingwall de fax es [place] [phone].  Si tiene un asunto urgente cuando la clnica est cerrada y que no puede esperar hasta el siguiente da hbil, puede llamar/localizar a su doctor(a) al nmero que aparece a continuacin.   Por favor, tenga en cuenta que aunque hacemos todo lo posible para estar disponibles para asuntos urgentes fuera del horario de [place], no estamos disponibles las 24 horas del da, los 7 das de la [place].   Si tiene un problema urgente y no puede comunicarse con nosotros, puede optar por buscar atencin mdica  en el consultorio de su doctor(a), en una clnica privada, en un centro de atencin urgente o en una sala de emergencias.  Si tiene Engineering geologist, por favor llame inmediatamente al 911 o vaya a la sala de  emergencias.  Nmeros de bper  - Dr. Nehemiah Massed: [phone]  - Dra. Moye: [phone]  - Dra. Nicole Kindred: [phone]  En caso de inclemencias del [place], por favor llame a Johnsie Kindred principal al [phone] para una actualizacin sobre el [place] de cualquier retraso o cierre.  Consejos para la medicacin en dermatologa: Por favor, guarde las cajas en las que vienen los medicamentos de uso tpico para ayudarle a seguir las instrucciones sobre dnde y cmo usarlos. Las farmacias generalmente imprimen las instrucciones del medicamento slo en las cajas y no directamente en los tubos del [place].   Si su medicamento es [employer], por favor, pngase en contacto con Cleotis Nipper  oficina llamando al [phone] y presione la opcin 4 o envenos un mensaje a travs de Pharmacist, community.   No podemos decirle cul ser su copago por los medicamentos por adelantado ya que esto es diferente dependiendo de la cobertura de su seguro. Sin embargo, es posible que podamos encontrar un medicamento sustituto a Electrical engineer un formulario para que el seguro cubra el medicamento que se considera necesario.   Si se requiere una autorizacin previa para que su compaa de seguros [place] su medicamento, por favor permtanos de 1 a 2 das hbiles para completar este proceso.  Los precios de los medicamentos varan con frecuencia dependiendo del Environmental consultant de dnde se surte la receta y alguna farmacias pueden ofrecer precios ms baratos.  El sitio web www.goodrx.com tiene cupones para medicamentos de Airline pilot. Los precios aqu no tienen en cuenta lo que podra costar con la ayuda del seguro (puede ser ms barato con su seguro), pero el sitio web puede darle el precio si no utiliz Research scientist (physical sciences).  - Puede imprimir el cupn correspondiente y llevarlo con su receta a la farmacia.  - Tambin puede pasar por nuestra oficina durante el horario de atencin regular y Charity fundraiser una tarjeta de cupones de GoodRx.  - Si  necesita que su receta se enve electrnicamente a una farmacia diferente, informe a nuestra oficina a travs de MyChart de [place] o por telfono llamando al [phone] y presione la opcin 4.

## 2021-07-30 ENCOUNTER — Other Ambulatory Visit: Payer: Self-pay

## 2021-07-30 ENCOUNTER — Telehealth: Payer: Self-pay

## 2021-07-30 DIAGNOSIS — L719 Rosacea, unspecified: Secondary | ICD-10-CM

## 2021-07-30 NOTE — Telephone Encounter (Signed)
Pharmacy states Christine Adams is non-formulary and patient must use Doxycycline Hyclate or Monohydrate. Which alternative would you like me to send in?

## 2021-08-02 ENCOUNTER — Other Ambulatory Visit: Payer: Self-pay

## 2021-08-02 NOTE — Telephone Encounter (Signed)
I know you use Monohydrate I just need to know the dose. Thank you!

## 2021-08-03 ENCOUNTER — Other Ambulatory Visit: Payer: Self-pay

## 2021-08-03 MED ORDER — DOXYCYCLINE HYCLATE 20 MG PO TABS
20.0000 mg | ORAL_TABLET | Freq: Two times a day (BID) | ORAL | 3 refills | Status: DC
  Filled 2021-08-03 – 2021-08-20 (×2): qty 60, 30d supply, fill #0

## 2021-08-03 NOTE — Telephone Encounter (Signed)
Doxycycline hyclate 20 mg twice a day with food. (Monohydrate is only available in the 100s). Thank you!

## 2021-08-04 ENCOUNTER — Ambulatory Visit (INDEPENDENT_AMBULATORY_CARE_PROVIDER_SITE_OTHER): Payer: 59 | Admitting: Family Medicine

## 2021-08-04 ENCOUNTER — Other Ambulatory Visit: Payer: Self-pay

## 2021-08-04 ENCOUNTER — Encounter (INDEPENDENT_AMBULATORY_CARE_PROVIDER_SITE_OTHER): Payer: Self-pay | Admitting: Family Medicine

## 2021-08-04 VITALS — BP 128/87 | HR 73 | Temp 98.1°F | Ht 65.0 in | Wt 195.0 lb

## 2021-08-04 DIAGNOSIS — Z8249 Family history of ischemic heart disease and other diseases of the circulatory system: Secondary | ICD-10-CM

## 2021-08-04 DIAGNOSIS — E559 Vitamin D deficiency, unspecified: Secondary | ICD-10-CM | POA: Diagnosis not present

## 2021-08-04 DIAGNOSIS — R5383 Other fatigue: Secondary | ICD-10-CM

## 2021-08-04 DIAGNOSIS — F39 Unspecified mood [affective] disorder: Secondary | ICD-10-CM

## 2021-08-04 DIAGNOSIS — Z6832 Body mass index (BMI) 32.0-32.9, adult: Secondary | ICD-10-CM | POA: Diagnosis not present

## 2021-08-04 DIAGNOSIS — E038 Other specified hypothyroidism: Secondary | ICD-10-CM | POA: Diagnosis not present

## 2021-08-04 DIAGNOSIS — E669 Obesity, unspecified: Secondary | ICD-10-CM | POA: Diagnosis not present

## 2021-08-04 DIAGNOSIS — Z9189 Other specified personal risk factors, not elsewhere classified: Secondary | ICD-10-CM

## 2021-08-04 DIAGNOSIS — R0602 Shortness of breath: Secondary | ICD-10-CM

## 2021-08-05 ENCOUNTER — Encounter: Payer: Self-pay | Admitting: Dermatology

## 2021-08-05 LAB — CBC WITH DIFFERENTIAL/PLATELET
Basophils Absolute: 0.1 10*3/uL (ref 0.0–0.2)
Basos: 1 %
EOS (ABSOLUTE): 0.2 10*3/uL (ref 0.0–0.4)
Eos: 3 %
Hematocrit: 47.1 % — ABNORMAL HIGH (ref 34.0–46.6)
Hemoglobin: 15.7 g/dL (ref 11.1–15.9)
Immature Grans (Abs): 0 10*3/uL (ref 0.0–0.1)
Immature Granulocytes: 0 %
Lymphocytes Absolute: 2.2 10*3/uL (ref 0.7–3.1)
Lymphs: 34 %
MCH: 29.2 pg (ref 26.6–33.0)
MCHC: 33.3 g/dL (ref 31.5–35.7)
MCV: 88 fL (ref 79–97)
Monocytes Absolute: 0.3 10*3/uL (ref 0.1–0.9)
Monocytes: 5 %
Neutrophils Absolute: 3.8 10*3/uL (ref 1.4–7.0)
Neutrophils: 57 %
Platelets: 283 10*3/uL (ref 150–450)
RBC: 5.38 x10E6/uL — ABNORMAL HIGH (ref 3.77–5.28)
RDW: 12.1 % (ref 11.7–15.4)
WBC: 6.6 10*3/uL (ref 3.4–10.8)

## 2021-08-05 LAB — T4, FREE: Free T4: 1.49 ng/dL (ref 0.82–1.77)

## 2021-08-05 LAB — COMPREHENSIVE METABOLIC PANEL
ALT: 24 IU/L (ref 0–32)
AST: 22 IU/L (ref 0–40)
Albumin/Globulin Ratio: 1.7 (ref 1.2–2.2)
Albumin: 4.5 g/dL (ref 3.8–4.8)
Alkaline Phosphatase: 92 IU/L (ref 44–121)
BUN/Creatinine Ratio: 19 (ref 9–23)
BUN: 13 mg/dL (ref 6–20)
Bilirubin Total: 0.5 mg/dL (ref 0.0–1.2)
CO2: 20 mmol/L (ref 20–29)
Calcium: 9.3 mg/dL (ref 8.7–10.2)
Chloride: 103 mmol/L (ref 96–106)
Creatinine, Ser: 0.7 mg/dL (ref 0.57–1.00)
Globulin, Total: 2.7 g/dL (ref 1.5–4.5)
Glucose: 81 mg/dL (ref 70–99)
Potassium: 4.3 mmol/L (ref 3.5–5.2)
Sodium: 140 mmol/L (ref 134–144)
Total Protein: 7.2 g/dL (ref 6.0–8.5)
eGFR: 113 mL/min/{1.73_m2} (ref >59)

## 2021-08-05 LAB — INSULIN, RANDOM: INSULIN: 13.3 u[IU]/mL (ref 2.6–24.9)

## 2021-08-05 LAB — LIPID PANEL WITH LDL/HDL RATIO
Cholesterol, Total: 164 mg/dL (ref 100–199)
HDL: 32 mg/dL — ABNORMAL LOW (ref >39)
LDL Chol Calc (NIH): 94 mg/dL (ref 0–99)
LDL/HDL Ratio: 2.9 ratio (ref 0.0–3.2)
Triglycerides: 225 mg/dL — ABNORMAL HIGH (ref 0–149)
VLDL Cholesterol Cal: 38 mg/dL (ref 5–40)

## 2021-08-05 LAB — TSH: TSH: 2.24 u[IU]/mL (ref 0.450–4.500)

## 2021-08-05 LAB — FOLATE: Folate: >20 ng/mL (ref >3.0)

## 2021-08-05 LAB — VITAMIN D 25 HYDROXY (VIT D DEFICIENCY, FRACTURES): Vit D, 25-Hydroxy: 57.5 ng/mL (ref 30.0–100.0)

## 2021-08-05 LAB — HEMOGLOBIN A1C
Est. average glucose Bld gHb Est-mCnc: 100 mg/dL
Hgb A1c MFr Bld: 5.1 % (ref 4.8–5.6)

## 2021-08-05 LAB — VITAMIN B12: Vitamin B-12: 780 pg/mL (ref 232–1245)

## 2021-08-05 LAB — T3: T3, Total: 112 ng/dL (ref 71–180)

## 2021-08-08 ENCOUNTER — Encounter: Payer: Self-pay | Admitting: Physician Assistant

## 2021-08-08 ENCOUNTER — Telehealth: Payer: 59 | Admitting: Physician Assistant

## 2021-08-08 DIAGNOSIS — J020 Streptococcal pharyngitis: Secondary | ICD-10-CM

## 2021-08-08 MED ORDER — AMOXICILLIN 500 MG PO CAPS: 500.0000 mg | ORAL_CAPSULE | Freq: Two times a day (BID) | ORAL | 0 refills | Status: DC

## 2021-08-08 NOTE — Progress Notes (Signed)
Virtual Visit Consent   Christine Adams, you are scheduled for a virtual visit with a [place] provider today.     Just as with appointments in the office, your consent must be obtained to participate.  Your consent will be active for this visit and any virtual visit you may have with one of our providers in the next 365 days.     If you have a MyChart account, a copy of this consent can be sent to you electronically.  All virtual visits are billed to your insurance company just like a traditional visit in the office.    As this is a virtual visit, video technology does not allow for your provider to perform a traditional examination.  This may limit your provider's ability to fully assess your condition.  If your provider identifies any concerns that need to be evaluated in person or the need to arrange testing (such as labs, EKG, etc.), we will make arrangements to do so.     Although advances in technology are sophisticated, we cannot ensure that it will always work on either your end or our end.  If the connection with a video visit is poor, the visit may have to be switched to a telephone visit.  With either a video or telephone visit, we are not always able to ensure that we have a secure connection.     I need to obtain your verbal consent now.   Are you willing to proceed with your visit today?    Shiasia Porro has provided verbal consent on 08/08/2021 for a virtual visit (video or telephone).   Mar Daring, PA-C   Date: 08/08/2021 6:57 PM   Virtual Visit via Video Note   I, Mar Daring, connected with  Christine Adams  ([id number], [birth year]) on 08/08/21 at  6:45 PM EST by a video-enabled telemedicine application and verified that I am speaking with the correct person using two identifiers.  Location: Patient: Virtual Visit Location Patient: Home Provider: Virtual Visit Location Provider: Home Office   I discussed the limitations of evaluation and management by  telemedicine and the availability of in person appointments. The patient expressed understanding and agreed to proceed.    History of Present Illness: Christine Adams is a 41 y.o. who identifies as a female who was assigned female at birth, and is being seen today for possible strep throat.  HPI: Sore Throat  This is a new problem. The current episode started yesterday. The problem has been gradually worsening. The maximum temperature recorded prior to her arrival was 102 - 102.9 F (102.5). The pain is moderate. Associated symptoms include congestion, ear pain (fullness) and headaches. Associated symptoms comments: Decreased appetite, nausea. She has had exposure to strep. Exposure to: possibly exposed from toddler son. She has tried acetaminophen, NSAIDs, cool liquids and gargles (ricola, wartm liquids, sudafed, mucinex) for the symptoms. The treatment provided no relief.   At home covid test was negative  Problems:  Patient Active Problem List   Diagnosis Date Noted   Chronic low back pain 06/01/2021   Fatigue 12/09/2020   BMI 30.0-30.9,adult    Encounter for induction of labor 10/11/2019   Rosacea 12/10/2018   Asthma 11/27/2017   Preventative health care 12/08/2016   Hypothyroidism 05/25/2016   Attention deficit hyperactivity disorder (ADHD) 05/25/2016   Sleep walking disorder 01/22/2015   Dysplasia of cervix, low grade (CIN 1) 11/25/2013   Pap smear abnormality of cervix/human papillomavirus (HPV) positive 11/25/2013   Sleep  disorder, circadian, shift work type 06/07/2013   Chronic cough 02/27/2013   Anxiety 01/21/2013   Seasonal allergies 01/21/2013    Allergies:  Allergies  Allergen Reactions   Molds & Smuts Itching    Dust mites, mold airborne    Medications:  Current Outpatient Medications:    amoxicillin (AMOXIL) 500 MG capsule, Take 1 capsule (500 mg total) by mouth 2 (two) times daily for 10 days., Disp: 20 capsule, Rfl: 0   azelaic acid (AZELEX) 20 % cream, Apply 1  application topically 2 (two) times daily. After skin is thoroughly washed and patted dry, gently but thoroughly massage a thin film of azelaic acid cream into the affected area twice daily, in the morning and evening., Disp: , Rfl:    cetirizine (ZYRTEC) 10 MG tablet, Take 10 mg by mouth daily., Disp: , Rfl:    cholecalciferol (VITAMIN D) 1000 units tablet, Take 3,000 Units by mouth daily., Disp: , Rfl:    doxycycline (PERIOSTAT) 20 MG tablet, Take 1 tablet (20 mg total) by mouth 2 (two) times daily. Take with food, Disp: 60 tablet, Rfl: 3   hydrocortisone 2.5 % cream, APPLY TOPICALLY 2 TIMES DAILY AS NEEDED (AREAS ON CHIN) FOR 2 WEEKS., Disp: 28 g, Rfl: 1   levothyroxine (SYNTHROID) 137 MCG tablet, Take 1 tablet by mouth every morning on an empty stomach with water only.  No food or other medications for 30 minutes., Disp: 90 tablet, Rfl: 1   Prenatal Vit-Fe Fumarate-FA (PRENATAL MULTIVITAMIN) TABS tablet, Take 1 tablet by mouth daily at 12 noon., Disp: , Rfl:    sertraline (ZOLOFT) 50 MG tablet, Take 50 mg by mouth daily., Disp: , Rfl:    valACYclovir (VALTREX) 500 MG tablet, Take by mouth as needed., Disp: , Rfl:   Observations/Objective: Patient is well-developed, well-nourished in no acute distress.  Resting comfortably at home.  Head is normocephalic, atraumatic.  No labored breathing.  Speech is clear and coherent with logical content.  Patient is alert and oriented at baseline.    Assessment and Plan: 1. Strep pharyngitis - amoxicillin (AMOXIL) 500 MG capsule; Take 1 capsule (500 mg total) by mouth 2 (two) times daily for 10 days.  Dispense: 20 capsule; Refill: 0  - Suspected strep - Amoxicillin prescribed - Continue tylenol and ibuprofen alternating as needed - Continue to push fluids, balance water and electrolyte beverages - Warm liquids - Salt water gargles - Seek in person evaluation if not improving or if symptoms worsen   Follow Up Instructions: I discussed the  assessment and treatment plan with the patient. The patient was provided an opportunity to ask questions and all were answered. The patient agreed with the plan and demonstrated an understanding of the instructions.  A copy of instructions were sent to the patient via MyChart unless otherwise noted below.   The patient was advised to call back or seek an in-person evaluation if the symptoms worsen or if the condition fails to improve as anticipated.  Time:  I spent 12 minutes with the patient via telehealth technology discussing the above problems/concerns.    Mar Daring, PA-C

## 2021-08-08 NOTE — Patient Instructions (Signed)
Christine Adams, thank you for joining Mar Daring, PA-C for today's virtual visit.  While this provider is not your primary care provider (PCP), if your PCP is located in our provider database this encounter information will be shared with them immediately following your visit.  Consent: (Patient) Christine Adams provided verbal consent for this virtual visit at the beginning of the encounter.  Current Medications:  Current Outpatient Medications:    amoxicillin (AMOXIL) 500 MG capsule, Take 1 capsule (500 mg total) by mouth 2 (two) times daily for 10 days., Disp: 20 capsule, Rfl: 0   azelaic acid (AZELEX) 20 % cream, Apply 1 application topically 2 (two) times daily. After skin is thoroughly washed and patted dry, gently but thoroughly massage a thin film of azelaic acid cream into the affected area twice daily, in the morning and evening., Disp: , Rfl:    cetirizine (ZYRTEC) 10 MG tablet, Take 10 mg by mouth daily., Disp: , Rfl:    cholecalciferol (VITAMIN D) 1000 units tablet, Take 3,000 Units by mouth daily., Disp: , Rfl:    doxycycline (PERIOSTAT) 20 MG tablet, Take 1 tablet (20 mg total) by mouth 2 (two) times daily. Take with food, Disp: 60 tablet, Rfl: 3   hydrocortisone 2.5 % cream, APPLY TOPICALLY 2 TIMES DAILY AS NEEDED (AREAS ON CHIN) FOR 2 WEEKS., Disp: 28 g, Rfl: 1   levothyroxine (SYNTHROID) 137 MCG tablet, Take 1 tablet by mouth every morning on an empty stomach with water only.  No food or other medications for 30 minutes., Disp: 90 tablet, Rfl: 1   Prenatal Vit-Fe Fumarate-FA (PRENATAL MULTIVITAMIN) TABS tablet, Take 1 tablet by mouth daily at 12 noon., Disp: , Rfl:    sertraline (ZOLOFT) 50 MG tablet, Take 50 mg by mouth daily., Disp: , Rfl:    valACYclovir (VALTREX) 500 MG tablet, Take by mouth as needed., Disp: , Rfl:    Medications ordered in this encounter:  Meds ordered this encounter  Medications   amoxicillin (AMOXIL) 500 MG capsule    Sig: Take 1 capsule (500 mg  total) by mouth 2 (two) times daily for 10 days.    Dispense:  20 capsule    Refill:  0    Order Specific Question:   Supervising Provider    Answer:   Sabra Heck, [place]     *If you need refills on other medications prior to your next appointment, please contact your pharmacy*  Follow-Up: Call back or seek an in-person evaluation if the symptoms worsen or if the condition fails to improve as anticipated.  Other Instructions Strep Throat, Adult Strep throat is an infection in the throat that is caused by bacteria. It is common during the cold months of the year. It mostly affects children who are 21-64 years old. However, people of all ages can get it at any time of the year. This infection spreads from person to person (is contagious) through coughing, sneezing, or having close contact. Your health care provider may use other names to describe the infection. When strep throat affects the tonsils, it is called tonsillitis. When it affects the back of the throat, it is called pharyngitis. What are the causes? This condition is caused by the Streptococcus pyogenes bacteria. What increases the risk? You are more likely to develop this condition if: You care for school-age children, or are around school-age children. Children are more likely to get strep throat and may spread it to others. You spend time in crowded places where the  infection can spread easily. You have close contact with someone who has strep throat. What are the signs or symptoms? Symptoms of this condition include: Fever or chills. Redness, swelling, or pain in the tonsils or throat. Pain or difficulty when swallowing. White or yellow spots on the tonsils or throat. Tender glands in the neck and under the jaw. Bad smelling breath. Red rash all over the body. This is rare. How is this diagnosed? This condition is diagnosed by tests that check for the presence and the amount of bacteria that cause strep throat. They  are: Rapid strep test. Your throat is swabbed and checked for the presence of bacteria. Results are usually ready in minutes. Throat culture test. Your throat is swabbed. The sample is placed in a cup that allows infections to grow. Results are usually ready in 1 or 2 days. How is this treated? This condition may be treated with: Medicines that kill germs (antibiotics). Medicines that relieve pain or fever. These include: Ibuprofen or acetaminophen. Aspirin, only for people who are over the age of 75. Throat lozenges. Throat sprays. Follow these instructions at home: Medicines  Take over-the-counter and prescription medicines only as told by your health care provider. Take your antibiotic medicine as told by your health care provider. Do not stop taking the antibiotic even if you start to feel better. Eating and drinking  If you have trouble swallowing, try eating soft foods until your sore throat feels better. Drink enough fluid to keep your urine pale yellow. To help relieve pain, you may have: Warm fluids, such as soup and tea. Cold fluids, such as frozen desserts or popsicles. General instructions Gargle with a salt-water mixture 3-4 times a day or as needed. To make a salt-water mixture, completely dissolve -1 tsp (3-6 g) of salt in 1 cup (237 mL) of warm water. Get plenty of rest. Stay home from work or school until you have been taking antibiotics for 24 hours. Do not use any products that contain nicotine or tobacco. These products include cigarettes, chewing tobacco, and vaping devices, such as e-cigarettes. If you need help quitting, ask your health care provider. It is up to you to get your test results. Ask your health care provider, or the department that is doing the test, when your results will be ready. Keep all follow-up visits. This is important. How is this prevented?  Do not share food, drinking cups, or personal items that could cause the infection to spread to  other people. Wash your hands often with soap and water for at least 20 seconds. If soap and water are not available, use hand sanitizer. Make sure that all people in your house wash their hands well. Have family members tested if they have a sore throat or fever. They may need an antibiotic if they have strep throat. Contact a health care provider if: You have swelling in your neck that keeps getting bigger. You develop a rash, cough, or earache. You cough up a thick mucus that is green, yellow-brown, or bloody. You have pain or discomfort that does not get better with medicine. Your symptoms seem to be getting worse. You have a fever. Get help right away if: You have new symptoms, such as vomiting, severe headache, stiff or painful neck, chest pain, or shortness of breath. You have severe throat pain, drooling, or changes in your voice. You have swelling of the neck, or the skin on the neck becomes red and tender. You have signs of dehydration,  such as tiredness (fatigue), dry mouth, and decreased urination. You become increasingly sleepy, or you cannot wake up completely. Your joints become red or painful. These symptoms may represent a serious problem that is an emergency. Do not wait to see if the symptoms will go away. Get medical help right away. Call your local emergency services (911 in the [place] Do not drive yourself to the hospital. Summary Strep throat is an infection in the throat that is caused by the Streptococcus pyogenes bacteria. This infection is spread from person to person (is contagious) through coughing, sneezing, or having close contact. Take your medicines, including antibiotics, as told by your health care provider. Do not stop taking the antibiotic even if you start to feel better. To prevent the spread of germs, wash your hands well with soap and water. Have others do the same. Do not share food, drinking cups, or personal items. Get help right away if you have new  symptoms, such as vomiting, severe headache, stiff or painful neck, chest pain, or shortness of breath. This information is not intended to replace advice given to you by your health care provider. Make sure you discuss any questions you have with your health care provider. Document Revised: 10/06/2020 Document Reviewed: 10/06/2020 Elsevier Patient Education  2022 [employer].    If you have been instructed to have an in-person evaluation today at a local Urgent Care facility, please use the link below. It will take you to a list of all of our available [facility], including address, phone number and hours of operation. Please do not delay care.  [facility]  If you or a family member do not have a primary care provider, use the link below to schedule a visit and establish care. When you choose a [facility] primary care physician or advanced practice provider, you gain a long-term partner in health. Find a Primary Care Provider  Learn more about [facility] in-office and virtual care options: [facility]

## 2021-08-09 NOTE — Progress Notes (Signed)
Chief Complaint:   OBESITY Christine Adams (MR# [id number]) is a 41 y.o. female who presents for evaluation and treatment of obesity and related comorbidities. Current BMI is Body mass index is 32.45 kg/m. Christine Adams has been struggling with her weight for many years and has been unsuccessful in either losing weight, maintaining weight loss, or reaching her healthy weight goal.  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 She lives with her husband and 81 year old son. She gained weight within the past 4 years due to stress and especially with her baby. She craves sweets, carbohydrates, ice  cream, and candy. She occasionally skips meals, varies.  Christine Adams is currently in the action stage of change and ready to dedicate time achieving and maintaining a healthier weight. Christine Adams is interested in becoming our patient and working on intensive lifestyle modifications including (but not limited to) diet and exercise for weight loss.  Christine Adams's habits were reviewed today and are as follows: Her family eats meals together, she thinks her family will eat healthier with her, her desired weight loss is 45 lbs, she started gaining weight within the last 4 years and mostly after having her baby, her heaviest weight ever was 251 pounds, she has significant food cravings issues, she skips meals frequently, she is frequently drinking liquids with calories, she frequently makes poor food choices, she has problems with excessive hunger, she frequently eats larger portions than normal, and she struggles with emotional eating.  Depression Screen Christine Adams's Food and Mood (modified PHQ-9) score was 14.  Depression screen Christine Adams 2/9 08/04/2021  Decreased Interest 3  Down, Depressed, Hopeless 3  PHQ - 2 Score 6  Altered sleeping 0  Tired, decreased energy 3  Change in appetite 1  Feeling bad or failure about yourself  3  Trouble concentrating 1  Moving slowly or fidgety/restless 0  Suicidal thoughts 0  PHQ-9 Score 14   Difficult doing work/chores Somewhat difficult   Subjective:   1. Other fatigue Christine Adams admits to daytime somnolence and denies waking up still tired. Patient has a history of symptoms of daytime fatigue and morning headache. Christine Adams generally gets 8 hours of sleep per night, and states that she has generally restful sleep. Snoring is not present. Apneic episodes are not present. Epworth Sleepiness Score is 11.   2. Shortness of breath on exertion Christine Adams notes increasing shortness of breath with exercising and seems to be worsening over time with weight gain. She notes getting out of breath sooner with activity than she used to. This has not gotten worse recently. Christine Adams denies shortness of breath at rest or orthopnea.  3. Other specified hypothyroidism Christine Adams has a diagnosis of hypothyroidism. She is currently taking Synthroid 137 mcg.  4. Vitamin D deficiency Christine Adams is currently taking OTC vitamin D3 3,000 IU each day. She denies nausea, vomiting or muscle weakness.  5. Family history of early CAD Christine Adams's father died of a massive AMI age 61 yo and PGF also passed away in his early 26's as well with an AMI  6. Mood disorder ([place]) with emotional eating Christine Adams has post partum mood changes and it is ongoing now. She has a history of depression and anxiety, and she is taking Zoloft. She sees Dr. Darleene Cleaver for medications management and counseling.    7. At risk for impaired metabolic function Christine Adams is at increased risk for impaired metabolic function due to current nutrition and muscle mass.    Assessment/Plan:   Orders Placed This Encounter  Procedures   Hemoglobin A1c   Folate   Comprehensive metabolic panel   CBC with Differential/Platelet   Vitamin B12   Insulin, random   Lipid Panel With LDL/HDL Ratio   T3   T4, free   TSH   VITAMIN D 25 Hydroxy (Vit-D Deficiency, Fractures)   EKG 12-Lead    Medications Discontinued During This Encounter  Medication Reason    albuterol (PROVENTIL HFA;VENTOLIN HFA) 108 (90 Base) MCG/ACT inhaler    baclofen (LIORESAL) 10 MG tablet    beclomethasone (QVAR) 80 MCG/ACT inhaler    COVID-19 mRNA bivalent vaccine, Pfizer, (PFIZER COVID-19 VAC BIVALENT) injection    methylPREDNISolone (MEDROL DOSEPAK) 4 MG TBPK tablet    sertraline (ZOLOFT) 100 MG tablet    sertraline (ZOLOFT) 50 MG tablet    sertraline (ZOLOFT) 50 MG tablet    sertraline (ZOLOFT) 50 MG tablet    Multiple Vitamin (MULTIVITAMIN) tablet    ibuprofen (ADVIL) 600 MG tablet       1. Other fatigue Christine Adams does feel that her weight is causing her energy to be lower than it should be. Fatigue may be related to obesity, depression or many other causes. Labs will be ordered, and in the meanwhile, Christine Adams will focus on self care including making healthy food choices, increasing physical activity and focusing on stress reduction.  - Hemoglobin A1c - Folate - EKG 12-Lead - CBC with Differential/Platelet - Vitamin B12 - Insulin, random - T3 - T4, free - TSH  2. Shortness of breath on exertion Christine Adams does feel that she gets out of breath more easily that she used to when she exercises. Christine Adams's shortness of breath appears to be obesity related and exercise induced. She has agreed to work on weight loss and gradually increase exercise to treat her exercise induced shortness of breath. Will continue to monitor closely.  3. Other specified hypothyroidism We will check labs today. Christine Adams will continue her medication. Orders and follow up as documented in patient record.  Counseling Good thyroid control is important for overall health. Supratherapeutic thyroid levels are dangerous and will not improve weight loss results. Counseling: The correct way to take levothyroxine is fasting, with water, separated by at least 30 minutes from breakfast, and separated by more than 4 hours from calcium, iron, multivitamins, acid reflux medications (PPIs).   4. Vitamin D  deficiency Low Vitamin D level contributes to fatigue and are associated with obesity, breast, and colon cancer. We will check labs today. Christine Adams will continue OTC Vitamin D3 3,000 IU daily and will follow-up for routine testing of Vitamin D, at least 2-3 times per year to avoid over-replacement.  - VITAMIN D 25 Hydroxy (Vit-D Deficiency, Fractures)  5. Family history of early CAD We will check labs today, and will follow up at Christine Adams's next office visit in 2 weeks.  - Comprehensive metabolic panel - Lipid Panel With LDL/HDL Ratio  6. Mood disorder (Christine [place]) with emotional eating Behavior modification techniques were discussed today to help Christine Adams deal with her emotional/non-hunger eating behaviors. Christine Adams will continue her medications as directed. Orders and follow up as documented in patient record.   7. At risk for impaired metabolic function Christine Adams was given approximately 23 minutes of impaired  metabolic function prevention counseling today. We discussed intensive lifestyle modifications today with an emphasis on specific nutrition and exercise instructions and strategies.   Repetitive spaced learning was employed today to elicit superior memory formation and behavioral change.  8. Obesity with current BMI of 32.4 Christine Adams  is currently in the action stage of change and her goal is to continue with weight loss efforts. I recommend Christine Adams begin the structured treatment plan as follows:  She has agreed to the Category 2 Plan.  Exercise goals: As is.   Behavioral modification strategies: meal planning and cooking strategies and planning for success.  She was informed of the importance of frequent follow-up visits to maximize her success with intensive lifestyle modifications for her multiple health conditions. She was informed we would discuss her lab results at her next visit unless there is a critical issue that needs to be addressed sooner. Christine Adams agreed to keep her next visit at the agreed  upon time to discuss these results.    Objective:   Blood pressure 128/87, pulse 73, temperature 98.1 F (36.7 C), height 5\' 5"  (1.651 m), weight 195 lb (88.5 kg), SpO2 96 %, currently breastfeeding. Body mass index is 32.45 kg/m.  EKG: Normal sinus rhythm, rate 73 BPM.  Indirect Calorimeter completed today shows a VO2 of 248 and a REE of 1714.  Her calculated basal metabolic rate is 8250 thus her basal metabolic rate is better than expected.  General: Cooperative, alert, well developed, in no acute distress. HEENT: Conjunctivae and lids unremarkable. Cardiovascular: Regular rhythm.  Lungs: Normal work of breathing. Neurologic: No focal deficits.   Lab Results  Component Value Date   CREATININE 0.70 08/04/2021   BUN 13 08/04/2021   NA 140 08/04/2021   K 4.3 08/04/2021   CL 103 08/04/2021   CO2 20 08/04/2021   Lab Results  Component Value Date   ALT 24 08/04/2021   AST 22 08/04/2021   ALKPHOS 92 08/04/2021   BILITOT 0.5 08/04/2021   Lab Results  Component Value Date   HGBA1C 5.1 08/04/2021   HGBA1C 5.2 12/06/2016   Lab Results  Component Value Date   INSULIN 13.3 08/04/2021   Lab Results  Component Value Date   TSH 2.240 08/04/2021   Lab Results  Component Value Date   CHOL 164 08/04/2021   HDL 32 (L) 08/04/2021   LDLCALC 94 08/04/2021   LDLDIRECT 105.0 02/14/2020   TRIG 225 (H) 08/04/2021   CHOLHDL 5 02/14/2020   Lab Results  Component Value Date   WBC 6.6 08/04/2021   HGB 15.7 08/04/2021   HCT 47.1 (H) 08/04/2021   MCV 88 08/04/2021   PLT 283 08/04/2021   Lab Results  Component Value Date   IRON 87 12/09/2020   FERRITIN 54.9 12/09/2020   Attestation Statements:   Reviewed by clinician on day of visit: allergies, medications, problem list, medical history, surgical history, family history, social history, and previous encounter notes.   Wilhemena Durie, am acting as transcriptionist for [employer], DO.  I have reviewed the above  documentation for accuracy and completeness, and I agree with the above. Marjory Sneddon, D.O.  The [place] was signed into law in 2016 which includes the topic of electronic health records.  This provides immediate access to information in MyChart.  This includes consultation notes, operative notes, office notes, lab results and pathology reports.  If you have any questions about what you read please let us know at your next visit so we can discuss your concerns and take corrective action if need be.  We are right here with you.

## 2021-08-16 ENCOUNTER — Other Ambulatory Visit: Payer: Self-pay

## 2021-08-18 ENCOUNTER — Ambulatory Visit (INDEPENDENT_AMBULATORY_CARE_PROVIDER_SITE_OTHER): Payer: 59 | Admitting: Family Medicine

## 2021-08-18 ENCOUNTER — Encounter (INDEPENDENT_AMBULATORY_CARE_PROVIDER_SITE_OTHER): Payer: Self-pay | Admitting: Family Medicine

## 2021-08-18 ENCOUNTER — Other Ambulatory Visit: Payer: Self-pay

## 2021-08-18 VITALS — BP 119/73 | HR 76 | Temp 98.3°F | Ht 65.0 in | Wt 193.0 lb

## 2021-08-18 DIAGNOSIS — E669 Obesity, unspecified: Secondary | ICD-10-CM | POA: Diagnosis not present

## 2021-08-18 DIAGNOSIS — Z6832 Body mass index (BMI) 32.0-32.9, adult: Secondary | ICD-10-CM | POA: Diagnosis not present

## 2021-08-18 DIAGNOSIS — E781 Pure hyperglyceridemia: Secondary | ICD-10-CM | POA: Diagnosis not present

## 2021-08-18 DIAGNOSIS — E559 Vitamin D deficiency, unspecified: Secondary | ICD-10-CM | POA: Diagnosis not present

## 2021-08-18 DIAGNOSIS — E8881 Metabolic syndrome: Secondary | ICD-10-CM

## 2021-08-18 DIAGNOSIS — Z9189 Other specified personal risk factors, not elsewhere classified: Secondary | ICD-10-CM

## 2021-08-18 DIAGNOSIS — E038 Other specified hypothyroidism: Secondary | ICD-10-CM | POA: Diagnosis not present

## 2021-08-18 MED ORDER — METFORMIN HCL 500 MG PO TABS
ORAL_TABLET | ORAL | 0 refills | Status: DC
  Filled 2021-08-18: qty 30, 30d supply, fill #0

## 2021-08-19 ENCOUNTER — Other Ambulatory Visit: Payer: Self-pay

## 2021-08-19 NOTE — Progress Notes (Signed)
Chief Complaint:   OBESITY Christine Adams is here to discuss her progress with her obesity treatment plan along with follow-up of her obesity related diagnoses. Christine Adams is on the Category 2 Plan and states she is following her eating plan approximately 65% of the time. Christine Adams states she is not currently exercising.  Today's visit was #: 2 Starting weight: 195 lbs Starting date: 08/04/2021 Today's weight: 193 lbs Today's date: 08/18/2021 Total lbs lost to date: 2 Total lbs lost since last in-office visit: 2  Interim History: Christine Adams is here today for her first follow-up office visit since starting the program with [place].  All blood work/ lab tests that were recently ordered by myself or an outside provider were reviewed with patient today per their request.   Extended time was spent counseling her on all new disease processes that were discovered or preexisting ones that are affected by BMI.  she understands that many of these abnormalities will need to monitored regularly along with the current treatment plan of prudent dietary changes, in which we are making each and every office visit, to improve these health parameters. Most days, pt notes she can't eat all her sandwich, as it is just too much at lunch. She notes she is starving for something sweet in the afternoons. She loves her candy between lunch and dinner while at work. She hasn't eaten dinner on plan.  Subjective:   1. Insulin resistance New. Discussed labs with patient today. Pt reports sweets and carb cravings. She has some emotional components and sees Dr. Darleene Cleaver for mood counseling and medication management. Pt denies concerns but knows she needs to change her habits.  2. Hypertriglyceridemia, with low HDL Worsening. Discussed labs with patient today. Pt's triglycerides are elevated and HDL is low. She is not on meds but was previously on fish oil, which was not helpful. Even when she was very active, her HDL remained low.    3. Other specified hypothyroidism Discussed labs with patient today. At goal. Medication: Synthroid 137 mcg QD  4. Vitamin D deficiency Discussed labs with patient today. At goal. Medication: OTC Vit D 3K IU daily  5. At risk of diabetes mellitus Christine Adams is at higher than average risk for developing diabetes due to her obesity.  Assessment/Plan:  No orders of the defined types were placed in this encounter.   Medications Discontinued During This Encounter  Medication Reason   amoxicillin (AMOXIL) 500 MG capsule Completed Course     Meds ordered this encounter  Medications   metFORMIN (GLUCOPHAGE) 500 MG tablet    Sig: 1 po with lunch daily    Dispense:  30 tablet    Refill:  0    30 d supply;  ** OV for RF **   Do not send RF request     1. Insulin resistance Increased fasting insulin- newly diagnosed. Extensive counseling done and handouts on insulin resistance and Metformin given to pt. Risk and benefits of meds discussed with pt. Pt will decrease dimple carbs and eat all proteins on plan. Start Metformin 500 mg QD and recheck levels in 3 months.  2. Hypertriglyceridemia, with low HDL Decrease fatty carbs and simple carbs. Counseling done on effects of food. Eventually increase exercise and follow prudent nutritional plan.  3. Other specified hypothyroidism Thyroid panel at goal. Continue Synthroid at current dose.  4. Vitamin D deficiency At goal. Low Vitamin D level contributes to fatigue and are associated with obesity, breast, and colon cancer. She  agrees to continue to take OTC Vitamin D 3,000 IU daily and will follow-up for routine testing of Vitamin D, at least 2-3 times per year to avoid over-replacement.  5. At risk of diabetes mellitus Christine Adams was given approximately 23 minutes of diabetic education and counseling today. We discussed intensive lifestyle modifications today with an emphasis on weight loss as well as increasing exercise and decreasing simple  carbohydrates in her diet. We also reviewed medication options with an emphasis on risk versus benefits of those discussed.  Repetitive spaced learning was employed today to elicit superior memory formation and behavioral change.  6. Obesity with current BMI of 32.2 Christine Adams is currently in the action stage of change. As such, her goal is to continue with weight loss efforts. She has agreed to the Category 2 Plan with breakfast options.   Exercise goals:  As is  Behavioral modification strategies: increasing lean protein intake, decreasing simple carbohydrates, no skipping meals, better snacking choices, and planning for success.  Christine Adams has agreed to follow-up with our clinic in 2 weeks. She was informed of the importance of frequent follow-up visits to maximize her success with intensive lifestyle modifications for her multiple health conditions.   Objective:   Blood pressure 119/73, pulse 76, temperature 98.3 F (36.8 C), height 5\' 5"  (1.651 m), weight 193 lb (87.5 kg), SpO2 96 %, currently breastfeeding. Body mass index is 32.12 kg/m.  General: Cooperative, alert, well developed, in no acute distress. HEENT: Conjunctivae and lids unremarkable. Cardiovascular: Regular rhythm.  Lungs: Normal work of breathing. Neurologic: No focal deficits.   Lab Results  Component Value Date   CREATININE 0.70 08/04/2021   BUN 13 08/04/2021   NA 140 08/04/2021   K 4.3 08/04/2021   CL 103 08/04/2021   CO2 20 08/04/2021   Lab Results  Component Value Date   ALT 24 08/04/2021   AST 22 08/04/2021   ALKPHOS 92 08/04/2021   BILITOT 0.5 08/04/2021   Lab Results  Component Value Date   HGBA1C 5.1 08/04/2021   HGBA1C 5.2 12/06/2016   Lab Results  Component Value Date   INSULIN 13.3 08/04/2021   Lab Results  Component Value Date   TSH 2.240 08/04/2021   Lab Results  Component Value Date   CHOL 164 08/04/2021   HDL 32 (L) 08/04/2021   LDLCALC 94 08/04/2021   LDLDIRECT 105.0 02/14/2020    TRIG 225 (H) 08/04/2021   CHOLHDL 5 02/14/2020   Lab Results  Component Value Date   VD25OH 57.5 08/04/2021   VD25OH 51.25 12/09/2020   Lab Results  Component Value Date   WBC 6.6 08/04/2021   HGB 15.7 08/04/2021   HCT 47.1 (H) 08/04/2021   MCV 88 08/04/2021   PLT 283 08/04/2021   Lab Results  Component Value Date   IRON 87 12/09/2020   FERRITIN 54.9 12/09/2020    Attestation Statements:   Reviewed by clinician on day of visit: allergies, medications, problem list, medical history, surgical history, family history, social history, and previous encounter notes.  Coral Ceo, CMA, am acting as transcriptionist for [employer], DO.  I have reviewed the above documentation for accuracy and completeness, and I agree with the above. Marjory Sneddon, D.O.  The [place] was signed into law in 2016 which includes the topic of electronic health records.  This provides immediate access to information in MyChart.  This includes consultation notes, operative notes, office notes, lab results and pathology reports.  If you have any questions about what you read please let us know at your next visit so we can discuss your concerns and take corrective action if need be.  We are right here with you.

## 2021-08-20 ENCOUNTER — Other Ambulatory Visit: Payer: Self-pay

## 2021-08-23 ENCOUNTER — Other Ambulatory Visit: Payer: Self-pay

## 2021-08-24 ENCOUNTER — Ambulatory Visit: Payer: 59 | Attending: Neurosurgery

## 2021-08-24 ENCOUNTER — Other Ambulatory Visit: Payer: Self-pay

## 2021-08-24 DIAGNOSIS — M6281 Muscle weakness (generalized): Secondary | ICD-10-CM | POA: Insufficient documentation

## 2021-08-24 DIAGNOSIS — M545 Low back pain, unspecified: Secondary | ICD-10-CM | POA: Insufficient documentation

## 2021-08-24 NOTE — Therapy (Signed)
[place] [facility] SERVICES [place], [place], [address] Phone: [phone]   Fax:  [phone]  Physical Therapy Evaluation  Patient Details  Name: Christine Adams MRN: [id number] Date of Birth: [birth year] Referring Provider (PT): Dr Izora Ribas   Encounter Date: 08/24/2021   PT End of Session - 08/24/21 1455     Visit Number 1    Number of Visits 16    Date for PT Re-Evaluation 10/19/21    Authorization Type 1/10 eval 2/28    PT Start Time 1345    PT Stop Time 1439    PT Time Calculation (min) 54 min    Activity Tolerance Patient tolerated treatment well    Behavior During Therapy [facility] for tasks assessed/performed             Past Medical History:  Diagnosis Date   ADHD    Asthma    Back pain    BMI 30.0-30.9,adult    Hypothyroidism    Age 80 yrs   Other fatigue    Postpartum anxiety- ongoing    Postpartum depression    Rosacea    Shortness of breath on exertion    Vitamin D deficiency     Past Surgical History:  Procedure Laterality Date   WISDOM TOOTH EXTRACTION      There were no vitals filed for this visit.    Subjective Assessment - 08/24/21 1357     Subjective Patient is a 41 year old female who presents for back and pelvis pain.    Pertinent History Patient is a 41 year old female who presents for lumbar radiculitis. 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 After her back went out in December 2022, had another spasm, went to Dr. Izora Ribas, had two rounds of steroids. Had and MRI which showed: Mild degenerative changes, with disc desiccation and mild disc bulges at L4-L5 and L5-S1.Pain is improving, waxes and wanes. Patient is very active, she rides horses    Limitations Sitting;Lifting;House hold activities;Other (comment)    How long can you sit comfortably? 20 minutes -30 minutes    How long can you stand comfortably? no limitations    How long can you walk comfortably? no  limitations    Diagnostic tests :MRI:  on 12/19  1. Mild degenerative changes, with disc desiccation and mild disc  bulges at L4-L5 and L5-S1.  2. No spinal canal stenosis or neural foraminal narrowing.    Patient Stated Goals not hurt, move better, not limit life because of the back and pelvic pain.    Currently in Pain? No/denies                [facility] PT Assessment - 08/24/21 0001       Assessment   Medical Diagnosis lumbar radiculitis    Referring Provider (PT) Dr Izora Ribas    Onset Date/Surgical Date --   a few months ago   Prior Therapy screen      Precautions   Precautions Back      Restrictions   Weight Bearing Restrictions No      Balance Screen   Has the patient fallen in the past 6 months No    Has the patient had a decrease in activity level because of a fear of falling?  Yes    Is the patient reluctant to leave their home because of a fear of falling?  No      Home Environment  Living Environment Private residence    Living Arrangements Spouse/significant other;Children    Available Help at Discharge Family    Type of [place] Full time employment    Vocation Requirements sitting    Leisure ride horses      Observation/Other Assessments   Focus on [place] ([place])  10              Patient is a 41 year old female who presents for lumbar radiculitis. 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 After her back went out in December 2022, had another spasm, went to Dr. Izora Ribas, had two rounds of steroids. Had and MRI which showed: Mild degenerative changes, with disc desiccation and mild disc bulges at L4-L5 and L5-S1.Pain is improving, waxes and wanes. Patient is very active, she rides horses    SUBJECTIVE Chief complaint:  low back pain and pelvic pain  History: patient had a spasm in December 2022 that resulted in severe loss of function.  Red flags (bowel/bladder changes, saddle  paresthesia, personal history of cancer, chills/fever, night sweats, unrelenting pain, first onset of insidious LBP <20 y/o) Positive: had constipation with back pain flare, has bouts of pain after  Referring Dx: lumbar radiculitis Referring Provider: Meade Maw Pain location: low back Pain: Present 0/10, Best 0 /10, Worst 8 /10 Pain quality: cramping and stabbing Radiating pain: No  Numbness/Tingling: No 24 hour pain behavior:  Aggravating factors: Easing factors: How long can you sit: How long can you stand: How long can you walk: History of prior back injury, pain, surgery, or therapy: Yes; 10 years ago, sneezed really hard and had a herniation and bulging disc; had PT and steroids.  Follow-up appointment with MD: No Dominant hand: right Imaging: Yes  Falls in the last 6 months: No  Occupational demands: prolonged sitting Hobbies:riding horses, play with son Goals: decrease pain.    OBJECTIVE  Mental Status Patient is oriented to person, place and time.  Recent memory is intact.  Remote memory is intact.  Attention span and concentration are intact.  Expressive speech is intact.  Patient's fund of knowledge is within normal limits for educational level.   Gross Musculoskeletal Assessment Tremor: None Bulk: Normal Tone: Normal No visible step-off along spinal column Noticeable muscle guarding along paraspinals.   Gait WFL when non painful, limited step length bilaterally with shortened stance phase on LLE.; slight vaulting pattern with RLE   Posture  Iliac crest height: L raised Lumbar lateral shift: positive Lower crossed syndrome (tight hip flexors and erector spinae; weak gluts and abs): positive Apparent leg length deficit: L leg 96.2  R leg 97.3    AROM (degrees) R/L (all movements include overpressure unless otherwise stated) Lumbar forward flexion (65): 50; very cautious  Lumbar extension (30): 20 Lumbar lateral flexion (25): R: limited by  25% pain L: limited by 25 % pain Thoracic and Lumbar rotation (30 degrees):  R: degrees L: degrees Hip IR : limited bilaterally with last 10 degrees Hip ER: limited bilaterally with last 10 degrees Hip Flexion (0-125): WFL Hip Abduction (0-40): R: L:WFL Hip extension (0-15): Slight tightness with hip flexors limit ROM bilaterally with L> R. :  *Indicates pain      Repeated Movements   Radiating pain:  L l1, l2 radiating to right iliac crest L4, l5 radiating to right iliac crest  Strength (out of 5) R/L 4+/4+ Hip flexion 4/4  Hip ER 4/4 Hip IR 4/4 Hip abduction 4/4 Hip adduction 3+/3+ Hip extension 5/5 Knee extension 5/5 Knee flexion 5/5 Ankle dorsiflexion 5/5 Ankle plantarflexion  *Indicates pain   Sensation Grossly intact to light touch bilateral LEs as determined by testing dermatomes L2-S2. Proprioception and hot/cold testing deferred on this date.     Palpation Graded on 0-4 scale (0 = no pain, 1 = pain, 2 = pain with wincing/grimacing/flinching, 3 = pain with withdrawal, 4 = unwilling to allow palpation) Location LEFT  RIGHT           Lumbar paraspinals 1 1  Quadratus Lumborum 1 1  Gluteus Maximus 0 0  Gluteus Medius 0 0  Deep hip external rotators 2 2  PSIS 1 1  Fortin's Area (SIJ) 2 2  Greater Trochanter 0 0     Muscle Length Hamstrings: R: 66 degrees L: 55degrees  Hip flexors : limited bilaterally with L>R  Passive Accessory Intervertebral Motion (PAIVM)  L l1, l2 radiating to right iliac crest L4, l5 radiating to right iliac crest   SPECIAL TESTS Lumbar Radiculopathy and Discogenic: Centralization and Peripheralization (SN 92, -LR 0.12): Positive Slump (SN 83, -LR 0.32): R: Positive L: Negative SLR (SN 92, -LR 0.29): R: Negative L:  Negative   Hip: FABER (SN 81): R: Negative L: Negative FADIR (SN 94): R: Negative L: Negative Hip scour (SN 50): R: Negative L: Negative   SIJ:  Thigh Thrust (SN 88, -LR 0.18) : R: Negative L:  Negative Distraction test: Positive with severe pain Compression test: negative      ASSESSMENT Clinical Impression: Pt is a pleasant 41 year-old female referred for low back pain with associated pelvic pain. Patient will benefit from co-treat of pain from ortho PT and pelvic PT to obtain maximal results. Patient has apparent leg length with LLE affected with an elevated pelvis. Pelvic is rotated back to a more neutral position with improvement in leg length discrepency immediately. Patient has weak core musculature requiring strengthening in future sessions. She has a positive slump test for RLE and radiating pain with R L4-5UPA and L L1-2 UPA. Pt presents with deficits in strength, mobility, range of motion, and pain. Pt will benefit from skilled PT services to address deficits and return to pain-free function at home and work.         Pain Provocation Cluster for SIJ Dysfunction Thigh Thrust Test (SN 88, -LR 0.18) Gaenslen's Test Distraction Test Compression Test Sacral Thrust Test  3 (+) tests: if discogenic pain has been ruled out through repeated extensions; SN 94, -LR 0.80       Lumbar Spinal Stenosis vs Radiculopathy due to Disc Herniation ([employer]an, 2016)  Description Spinal Stenosis Disc Herniation  Age Usually > 46 y/o Usually < 58 y/o  Onset Usually more insidious Usually more sudden  Position change: flexion Better  Worse  Position change: extension Worse Better  Focal muscle weakness Less common Can be common  Dural tension Less common Common  UMN or LMN involvement  Central stenosis: UMN Lateral foraminal stenosis: LMN LMN            Access Code: [email or web address] URL: [email or web address] Date: 08/24/2021 Prepared by: Janna Arch  Exercises Supine Posterior Pelvic Tilt - 1 x daily - 7 x weekly - 2 sets - 10 reps - 5 hold Supine Transversus Abdominis Bracing - Hands on Stomach - 1 x daily - 7 x weekly - 2 sets - 10 reps - 2 hold Seated  Thoracic Lumbar  Extension - 1 x daily - 7 x weekly - 2 sets - 10 reps - 5 hold   Objective measurements completed on examination: See above findings.                PT Education - 08/24/21 1455     Education Details goals, POC, HEP    Person(s) Educated Patient    Methods Explanation;Demonstration;Tactile cues;Verbal cues;Handout    Comprehension Verbalized understanding;Returned demonstration;Verbal cues required;Tactile cues required              PT Short Term Goals - 08/24/21 1500       PT SHORT TERM GOAL #1   Title Patient will be independent in home exercise program to improve strength/mobility for better functional independence with ADLs.    Baseline 2/28: HEP given    Time 4    Period Weeks    Status New    Target Date 09/21/21               PT Long Term Goals - 08/24/21 1500       PT LONG TERM GOAL #1   Title Patient will increase FOTO score to equal to or greater than 40%    to demonstrate statistically significant improvement in mobility and quality of life.    Baseline 2/28: 10    Time 8    Period Weeks    Status New    Target Date 10/19/21      PT LONG TERM GOAL #2   Title Pt will decrease mODI score by at least 13 points (32) in order demonstrate clinically significant reduction in back pain/disability.    Baseline 2/28: 45    Time 8    Period Weeks    Status New    Target Date 10/19/21      PT LONG TERM GOAL #3   Title Pt will decrease worst back pain as reported on NPRS by at least 3 points to 5/10 in order to demonstrate clinically significant reduction in back pain.    Baseline 2/28: 8/10    Time 8    Period Weeks    Status New    Target Date 10/19/21      PT LONG TERM GOAL #4   Title Patient will return to functional acitvity without muscle guarding or compensation to return to PLOF.    Baseline 2/28: frequently has to modify how to moves and bends to pick up her child    Time 8    Period Weeks    Status New    Target  Date 10/19/21      PT LONG TERM GOAL #5   Title Patient will increase BLE gross strength to 4+/5 as to improve functional strength for independent gait, increased standing tolerance and increased ADL ability.    Baseline 2/28: see note    Time 8    Period Weeks    Status New    Target Date 10/19/21                    Plan - 08/24/21 1457     Clinical Impression Statement Pt is a pleasant 41 year-old female referred for low back pain with associated pelvic pain. Patient will benefit from co-treat of pain from ortho PT and pelvic PT to obtain maximal results. Patient has apparent leg length with LLE affected with an elevated pelvis. Pelvic is rotated back to a more neutral position with improvement in leg length discrepency immediately. Patient  has weak core musculature requiring strengthening in future sessions. She has a positive slump test for RLE and radiating pain with R L4-5UPA and L L1-2 UPA. Pt presents with deficits in strength, mobility, range of motion, and pain. Pt will benefit from skilled PT services to address deficits and return to pain-free function at home and work.    Personal Factors and Comorbidities Age;Comorbidity 3+;Profession;Past/Current Experience;Time since onset of injury/illness/exacerbation    Comorbidities ADHD, asthma, fatigue, sleep disorder, anxiety, allergies, hypothyroidism    Examination-Activity Limitations Bed Mobility;Caring for Others;Bend;Carry;Lift;Sit;Squat;Stairs;Stand;Transfers    Examination-Participation Restrictions Cleaning;Driving;Laundry;Occupation;Shop;Volunteer;Yard Work;Other    Stability/Clinical Decision Making Stable/Uncomplicated    Clinical Decision Making Low    Rehab Potential Fair    PT Frequency 2x / week    PT Duration 8 weeks    PT Treatment/Interventions ADLs/Self Care Home Management;Aquatic Therapy;Cryotherapy;Electrical Stimulation;Moist Heat;Traction;Ultrasound;DME Instruction;Canalith Repostioning;Gait  Scientist, forensic;Therapeutic exercise;Therapeutic activities;Functional mobility training;Balance training;Neuromuscular re-education;Patient/family education;Manual techniques;Passive range of motion;Dry needling;Energy conservation;Splinting;Taping;Vestibular;Joint Manipulations;Spinal Manipulations;Visual/perceptual remediation/compensation    PT Next Visit Plan attempt distraction, core strengthening, mobilization    PT Home Exercise Plan see above    Consulted and Agree with Plan of Care Patient             Patient will benefit from skilled therapeutic intervention in order to improve the following deficits and impairments:  Abnormal gait, Decreased endurance, Decreased range of motion, Decreased mobility, Hypomobility, Difficulty walking, Impaired flexibility, Postural dysfunction, Improper body mechanics, Pain  Visit Diagnosis: Acute bilateral low back pain, unspecified whether sciatica present  Muscle weakness (generalized)     Problem List Patient Active Problem List   Diagnosis Date Noted   Chronic low back pain 06/01/2021   Fatigue 12/09/2020   BMI 30.0-30.9,adult    Encounter for induction of labor 10/11/2019   Rosacea 12/10/2018   Asthma 11/27/2017   Preventative health care 12/08/2016   Hypothyroidism 05/25/2016   Attention deficit hyperactivity disorder (ADHD) 05/25/2016   Sleep walking disorder 01/22/2015   Dysplasia of cervix, low grade (CIN 1) 11/25/2013   Pap smear abnormality of cervix/human papillomavirus (HPV) positive 11/25/2013   Sleep disorder, circadian, shift work type 06/07/2013   Chronic cough 02/27/2013   Anxiety 01/21/2013   Seasonal allergies 01/21/2013    Janna Arch, PT, DPT  08/24/2021, 3:06 PM  [place] [facility] SERVICES [address] [place], [place], [address] Phone: [phone]   Fax:  [phone]  Name: Christine Adams MRN: [id number] Date of Birth: [birth year]

## 2021-08-24 NOTE — Patient Instructions (Signed)
°  Access Code: [email or web address] URL: [email or web address] Date: 08/24/2021 Prepared by: Janna Arch  Exercises Supine Posterior Pelvic Tilt - 1 x daily - 7 x weekly - 2 sets - 10 reps - 5 hold Supine Transversus Abdominis Bracing - Hands on Stomach - 1 x daily - 7 x weekly - 2 sets - 10 reps - 2 hold Seated Thoracic Lumbar Extension - 1 x daily - 7 x weekly - 2 sets - 10 reps - 5 hold

## 2021-08-25 ENCOUNTER — Other Ambulatory Visit: Payer: Self-pay

## 2021-08-26 ENCOUNTER — Other Ambulatory Visit: Payer: Self-pay

## 2021-08-26 ENCOUNTER — Ambulatory Visit: Payer: 59 | Attending: Neurosurgery

## 2021-08-26 DIAGNOSIS — M6281 Muscle weakness (generalized): Secondary | ICD-10-CM | POA: Diagnosis not present

## 2021-08-26 DIAGNOSIS — M545 Low back pain, unspecified: Secondary | ICD-10-CM | POA: Insufficient documentation

## 2021-08-26 DIAGNOSIS — M533 Sacrococcygeal disorders, not elsewhere classified: Secondary | ICD-10-CM | POA: Insufficient documentation

## 2021-08-26 NOTE — Therapy (Signed)
[place] MA[facility] SERVICES [place], [place], [address] Phone: [phone]   Fax:  [phone]  Physical Therapy Treatment  Patient Details  Name: Christine Adams MRN: [id number] Date of Birth: Jul 10, 1980 Referring Provider (PT): Dr Izora Ribas   Encounter Date: 08/26/2021   PT End of Session - 08/26/21 1645     Visit Number 2    Number of Visits 16    Date for PT Re-Evaluation 10/19/21    Authorization Type 2/10 eval 2/28    PT Start Time 1430    PT Stop Time 1514    PT Time Calculation (min) 44 min    Activity Tolerance Patient tolerated treatment well    Behavior During Therapy [facility] for tasks assessed/performed             Past Medical History:  Diagnosis Date   ADHD    Asthma    Back pain    BMI 30.0-30.9,adult    Hypothyroidism    Age 41 yrs   Other fatigue    Postpartum anxiety- ongoing    Postpartum depression    Rosacea    Shortness of breath on exertion    Vitamin D deficiency     Past Surgical History:  Procedure Laterality Date   WISDOM TOOTH EXTRACTION      There were no vitals filed for this visit.   Subjective Assessment - 08/26/21 1645     Subjective Patient presents with no pain increase since last session, still guarding and limited in mobility.    Pertinent History Patient is a 41 year old female who presents for lumbar radiculitis. 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 After her back went out in December 2022, had another spasm, went to Dr. Izora Ribas, had two rounds of steroids. Had and MRI which showed: Mild degenerative changes, with disc desiccation and mild disc bulges at L4-L5 and L5-S1.Pain is improving, waxes and wanes. Patient is very active, she rides horses    Limitations Sitting;Lifting;House hold activities;Other (comment)    How long can you sit comfortably? 20 minutes -30 minutes    How long can you stand comfortably? no limitations    How long can you  walk comfortably? no limitations    Diagnostic tests :MRI:  on 12/19  1. Mild degenerative changes, with disc desiccation and mild disc  bulges at L4-L5 and L5-S1.  2. No spinal canal stenosis or neural foraminal narrowing.    Patient Stated Goals not hurt, move better, not limit life because of the back and pelvic pain.    Currently in Pain? No/denies              Manual: Hamstring lengthening stretch 60 seconds each LE ; leg on PT shoulder Piriformis stretch 60 seconds each LE with PT overpressure single knee to chest 60 seconds with each LE.   Prone: Prone press up with mobilization CPA lumbar spine x6 minutes  TherEx Supine:  TrA contraction with posterior pelvic tilt 10x 3 seconds Green swiss ball with TrA contraction between hands and knees 15x 3 second holds Green swiss ball with TrA contraction and UE raises 12x each UE      Trigger Point Dry Needling (TDN), unbilled Education performed with patient regarding potential benefit of TDN. Reviewed precautions and risks with patient. Reviewed special precautions/risks over lung fields which include pneumothorax. Reviewed signs and symptoms of pneumothorax and advised pt to go to ER immediately if these symptoms  develop advise them of dry needling treatment. Extensive time spent with pt to ensure full understanding of TDN risks. Pt provided verbal consent to treatment. TDN performed to  with 0.3 x 30; 0.3x0.6 single needle placements with local twitch response (LTR). Pistoning technique utilized. Improved pain-free motion following intervention. Muscles targeted: bilateral lumbar paraspinals, piriformis bilaterally.   Pt educated throughout session about proper posture and technique with exercises. Improved exercise technique, movement at target joints, use of target muscles after min to mod verbal, visual, tactile cues.   Patient introduced to TDN with good trigger points noted throughout lumbar paraspinals and piriformis. She  is highly motivated throughout session and her core fatigues quickly indicating area of continued focus. Patient reports reduced tension and improved mobility by end of session. Pt will benefit from skilled PT services to address deficits and return to pain-free function at home and work.                         PT Education - 08/26/21 1645     Education Details exercise technique, body mechanics    Person(s) Educated Patient    Methods Explanation;Demonstration;Tactile cues    Comprehension Verbalized understanding;Returned demonstration;Verbal cues required;Tactile cues required              PT Short Term Goals - 08/24/21 1500       PT SHORT TERM GOAL #1   Title Patient will be independent in home exercise program to improve strength/mobility for better functional independence with ADLs.    Baseline 2/28: HEP given    Time 4    Period Weeks    Status New    Target Date 09/21/21               PT Long Term Goals - 08/24/21 1500       PT LONG TERM GOAL #1   Title Patient will increase FOTO score to equal to or greater than 40%    to demonstrate statistically significant improvement in mobility and quality of life.    Baseline 2/28: 10    Time 8    Period Weeks    Status New    Target Date 10/19/21      PT LONG TERM GOAL #2   Title Pt will decrease mODI score by at least 13 points (32) in order demonstrate clinically significant reduction in back pain/disability.    Baseline 2/28: 45    Time 8    Period Weeks    Status New    Target Date 10/19/21      PT LONG TERM GOAL #3   Title Pt will decrease worst back pain as reported on NPRS by at least 3 points to 5/10 in order to demonstrate clinically significant reduction in back pain.    Baseline 2/28: 8/10    Time 8    Period Weeks    Status New    Target Date 10/19/21      PT LONG TERM GOAL #4   Title Patient will return to functional acitvity without muscle guarding or compensation to  return to PLOF.    Baseline 2/28: frequently has to modify how to moves and bends to pick up her child    Time 8    Period Weeks    Status New    Target Date 10/19/21      PT LONG TERM GOAL #5   Title Patient will increase BLE gross strength to 4+/5 as to  improve functional strength for independent gait, increased standing tolerance and increased ADL ability.    Baseline 2/28: see note    Time 8    Period Weeks    Status New    Target Date 10/19/21                   Plan - 08/26/21 1653     Clinical Impression Statement Patient introduced to TDN with good trigger points noted throughout lumbar paraspinals and piriformis. She is highly motivated throughout session and her core fatigues quickly indicating area of continued focus. Patient reports reduced tension and improved mobility by end of session. Pt will benefit from skilled PT services to address deficits and return to pain-free function at home and work.    Personal Factors and Comorbidities Age;Comorbidity 3+;Profession;Past/Current Experience;Time since onset of injury/illness/exacerbation    Comorbidities ADHD, asthma, fatigue, sleep disorder, anxiety, allergies, hypothyroidism    Examination-Activity Limitations Bed Mobility;Caring for Others;Bend;Carry;Lift;Sit;Squat;Stairs;Stand;Transfers    Examination-Participation Restrictions Cleaning;Driving;Laundry;Occupation;Shop;Volunteer;Yard Work;Other    Stability/Clinical Decision Making Stable/Uncomplicated    Rehab Potential Fair    PT Frequency 2x / week    PT Duration 8 weeks    PT Treatment/Interventions ADLs/Self Care Home Management;Aquatic Therapy;Cryotherapy;Electrical Stimulation;Moist Heat;Traction;Ultrasound;DME Instruction;Canalith Repostioning;Gait Scientist, forensic;Therapeutic exercise;Therapeutic activities;Functional mobility training;Balance training;Neuromuscular re-education;Patient/family education;Manual techniques;Passive range of motion;Dry  needling;Energy conservation;Splinting;Taping;Vestibular;Joint Manipulations;Spinal Manipulations;Visual/perceptual remediation/compensation    PT Next Visit Plan attempt distraction, core strengthening, mobilization    PT Home Exercise Plan see above    Consulted and Agree with Plan of Care Patient             Patient will benefit from skilled therapeutic intervention in order to improve the following deficits and impairments:  Abnormal gait, Decreased endurance, Decreased range of motion, Decreased mobility, Hypomobility, Difficulty walking, Impaired flexibility, Postural dysfunction, Improper body mechanics, Pain  Visit Diagnosis: Acute bilateral low back pain, unspecified whether sciatica present  Muscle weakness (generalized)     Problem List Patient Active Problem List   Diagnosis Date Noted   Chronic low back pain 06/01/2021   Fatigue 12/09/2020   BMI 30.0-30.9,adult    Encounter for induction of labor 10/11/2019   Rosacea 12/10/2018   Asthma 11/27/2017   Preventative health care 12/08/2016   Hypothyroidism 05/25/2016   Attention deficit hyperactivity disorder (ADHD) 05/25/2016   Sleep walking disorder 01/22/2015   Dysplasia of cervix, low grade (CIN 1) 11/25/2013   Pap smear abnormality of cervix/human papillomavirus (HPV) positive 11/25/2013   Sleep disorder, circadian, shift work type 06/07/2013   Chronic cough 02/27/2013   Anxiety 01/21/2013   Seasonal allergies 01/21/2013    Janna Arch, PT, DPT  08/26/2021, 4:54 PM  [place] [facility] SERVICES [address] [place], [place], [address] Phone: [phone]   Fax:  [phone]  Name: Christine Adams MRN: [id number] Date of Birth: [birth year]

## 2021-08-30 ENCOUNTER — Other Ambulatory Visit: Payer: Self-pay

## 2021-08-30 ENCOUNTER — Ambulatory Visit: Payer: 59 | Admitting: Physical Therapy

## 2021-08-30 DIAGNOSIS — M533 Sacrococcygeal disorders, not elsewhere classified: Secondary | ICD-10-CM | POA: Diagnosis not present

## 2021-08-30 DIAGNOSIS — M545 Low back pain, unspecified: Secondary | ICD-10-CM

## 2021-08-30 DIAGNOSIS — M6281 Muscle weakness (generalized): Secondary | ICD-10-CM | POA: Diagnosis not present

## 2021-08-30 NOTE — Patient Instructions (Signed)
Minisquat:  ( also use as technique for  picking up son ?Scoot buttocks back slight, hinge like you are looking at your reflection on a pond  ?Knees behind toes,  ?Inhale to "smell flowers" ? ?Exhale on the rise "like rocket"  ?Do not lock knees, have more weight across ballmounds of feet, toes relaxed  ? ?10 reps x 3 x day  ?__ ? ? ?Proper body mechanics with getting out of a chair to decrease strain  ?on back &pelvic floor  ? ?Avoid holding your breath when ?Getting out of the chair: ? ?Scoot to front part of chair chair ?Heels behind knees, feet are hip width apart, nose over toes  ?Inhale like you are smelling roses ?Exhale to stand  ? ?___ ? ? ?3- foot tap  10 reps  Each side  ? ?Hold onto wall  ? ?Slightly bend of standing knee, and keep hips above foot  ? ?ballmound of opposite leg  ? taps to each direction and  ? ?back to spot under hips- notice equal pressure through both legs, and across ballmound and heels  ? ? ?___ ? ?

## 2021-08-30 NOTE — Therapy (Signed)
[place] [facility] SERVICES [place], [place], [address] Phone: [phone]   Fax:  [phone]  Physical Therapy Treatment  Patient Details  Name: Christine Adams MRN: [id number] Date of Birth: [birth year] Referring Provider (PT): Dr Izora Ribas   Encounter Date: 08/30/2021   PT End of Session - 08/30/21 0810     Visit Number 3    Number of Visits 16    Date for PT Re-Evaluation 10/19/21    Authorization Type 2/10 eval 2/28    PT Start Time 0802    PT Stop Time 0900    PT Time Calculation (min) 58 min    Activity Tolerance Patient tolerated treatment well    Behavior During Therapy [facility] for tasks assessed/performed             Past Medical History:  Diagnosis Date   ADHD    Asthma    Back pain    BMI 30.0-30.9,adult    Hypothyroidism    Age 41 yrs   Other fatigue    Postpartum anxiety- ongoing    Postpartum depression    Rosacea    Shortness of breath on exertion    Vitamin D deficiency     Past Surgical History:  Procedure Laterality Date   WISDOM TOOTH EXTRACTION      There were no vitals filed for this visit.   Subjective Assessment - 08/30/21 0808     Subjective Pt is 2 years post partum with second degree tear.  Pt reports LBP that radiates at L/R L/S junction ( pt points to areas) and denies radiating pain down legs.  Pt had a debiliating back spasm in Dec 2022 when she was leaning to pick up her son.  Pt has constipation since having her baby in terms of stool pressure that cuases discomfort. Pt has pain after intercourse that is similar to stool pressure which also started after delivering baby. Pt has to cross her legs when she sneezes, laughing, coughing to prevent leakage. Pt has urgency as well. Pt has been horsebacking for 30 years since she was 10. Pt stopped riding since 23rd week of pregnancy and now is only riding every 2 weeks compared daily and not at the same intensity. Pt was a professional  horebacking riding and competed. pt plans to go back to competing . Pt just bought a new horse. Pt had a fall onto her tailbone injury 15 years ago and heled on its own.  Pt works 40 hours a week with lots of sit and getting up between patient . Pt would ike to get back to riding 4-5 day for 40-45 min a session on the horse with 1-2 hours caring for the horse which includes 50 lb of hay x 100 reps of lifting across 4 x year, lifting muck buckets daily 6 reps and dumping into maneuver spreading.  Pt enjoys gardening, mowing on a mower.    Pertinent History Patient is a 41 year old female who presents for lumbar radiculitis. 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 After her back went out in December 2022, had another spasm, went to Dr. Izora Ribas, had two rounds of steroids. Had and MRI which showed: Mild degenerative changes, with disc desiccation and mild disc bulges at L4-L5 and L5-S1.Pain is improving, waxes and wanes. Patient is very active, she rides horses    Limitations Sitting;Lifting;House hold activities;Other (comment)    How long can you sit comfortably? Blanket  minutes -30 minutes    How long can you stand comfortably? no limitations    How long can you walk comfortably? no limitations    Diagnostic tests :MRI:  on 12/19  1. Mild degenerative changes, with disc desiccation and mild disc  bulges at L4-L5 and L5-S1.  2. No spinal canal stenosis or neural foraminal narrowing.    Patient Stated Goals not hurt, move better, not limit life because of the back and pelvic pain.                [facility] PT Assessment - 08/30/21 0821       AROM   Overall AROM Comments figure 4 on LE is tighter than R, 70 cm from lateral tib fib to floor, R 60 cm      PROM   Overall PROM Comments FADDIR/FABDER B no pain, no restrictions      Strength   Overall Strength Comments R knee ext 4-/5,  LLE hip /knee flexion/ ext 55      Palpation   Spinal mobility all four directions of spine  without asymmetries nor pain, hypomobile on T/L junction on L   SI assessment  supine: L ASIS higher/ malleoli higher, standing: R iliac crest slightly higher                           [facility] Adult PT Treatment/Exercise - 08/30/21 0859       Therapeutic Activites    Other Therapeutic Activities pelvic girdle, BLE strength, SIJ  assessments      Neuro Re-ed    Neuro Re-ed Details  cued for propiocpeiton for mini squat and pelvic stability CKC exericse      Modalities   Modalities Moist Heat      Moist Heat Therapy   Number Minutes Moist Heat 5 Minutes    Moist Heat Location Other (comment)   sacrum/ ITband in butterfly pose, guided relaxation     Manual Therapy   Manual therapy comments long axis distraction RLE, rotational mob , STM/MWM at glut med/ minimal, ITband L LE , STM/MWM at [facility] joint, tob fib joint L                       PT Short Term Goals - 08/24/21 1500       PT SHORT TERM GOAL #1   Title Patient will be independent in home exercise program to improve strength/mobility for better functional independence with ADLs.    Baseline 2/28: HEP given    Time 4    Period Weeks    Status New    Target Date 09/21/21               PT Long Term Goals - 08/30/21 1355       PT LONG TERM GOAL #1   Title Patient will increase FOTO score to equal to or greater than 40%    to demonstrate statistically significant improvement in mobility and quality of life.    Baseline 2/28: 10    Time 8    Period Weeks    Status New    Target Date 10/19/21      PT LONG TERM GOAL #2   Title Pt will decrease mODI score by at least 13 points (32) in order demonstrate clinically significant reduction in back pain/disability.    Baseline 2/28: 45    Time 8    Period Weeks  Status New    Target Date 10/19/21      PT LONG TERM GOAL #3   Title Pt will decrease worst back pain as reported on NPRS by at least 3 points to 5/10 in order to demonstrate  clinically significant reduction in back pain.    Baseline 2/28: 8/10    Time 8    Period Weeks    Status New    Target Date 10/19/21      PT LONG TERM GOAL #4   Title Patient will return to functional acitvity without muscle guarding or compensation to return to PLOF.    Baseline 2/28: frequently has to modify how to moves and bends to pick up her child    Time 8    Period Weeks    Status New    Target Date 10/19/21      PT LONG TERM GOAL #5   Title Patient will increase BLE gross strength to 4+/5 as to improve functional strength for independent gait, increased standing tolerance and increased ADL ability.    Baseline 2/28: see note    Time 8    Period Weeks    Status New    Target Date 10/19/21                   Plan - 08/30/21 0810     Clinical Impression Statement Pelvic and SIJ assessment was provided by Pelvic PT today. R iliac crest was slightly higher with hypomobility on L at T/L junction which is likely related to preferred position with holding toddler on L hip with uneven WBing down LLE.  L SIJ hypomobility was adjusted with manual Tx which pt tolerated and pressure and technique was modified to provide comfort when pt reported tenderness/ pain. Pt demo'd increased hip flex/abd/ER on L post Tx which will help with LBP. Body mechanics with picking up son was addressed with cues for alignment and explanation of pelvic stability. Plan to apply regional interdependent approach to address tightness along lateral chain of BLE ( IT band, lateral leg) to promote more ER of hips/ thigh/ DF/EV which will help pt gradually return to horse back riding. Provided closed kinetic chain HEP to improve propioception and pelvic stability. Pt demo'd correctly after cues. Pt continues to benefit from skilled PT   Personal Factors and Comorbidities Age;Comorbidity 3+;Profession;Past/Current Experience;Time since onset of injury/illness/exacerbation    Comorbidities ADHD, asthma, fatigue,  sleep disorder, anxiety, allergies, hypothyroidism    Examination-Activity Limitations Bed Mobility;Caring for Others;Bend;Carry;Lift;Sit;Squat;Stairs;Stand;Transfers    Examination-Participation Restrictions Cleaning;Driving;Laundry;Occupation;Shop;Volunteer;Yard Work;Other    Stability/Clinical Decision Making Stable/Uncomplicated    Rehab Potential Fair    PT Frequency 2x / week    PT Duration 8 weeks    PT Treatment/Interventions ADLs/Self Care Home Management;Aquatic Therapy;Cryotherapy;Electrical Stimulation;Moist Heat;Traction;Ultrasound;DME Instruction;Canalith Repostioning;Gait Scientist, forensic;Therapeutic exercise;Therapeutic activities;Functional mobility training;Balance training;Neuromuscular re-education;Patient/family education;Manual techniques;Passive range of motion;Dry needling;Energy conservation;Splinting;Taping;Vestibular;Joint Manipulations;Spinal Manipulations;Visual/perceptual remediation/compensation    PT Next Visit Plan attempt distraction, core strengthening, mobilization    PT Home Exercise Plan see above    Consulted and Agree with Plan of Care Patient             Patient will benefit from skilled therapeutic intervention in order to improve the following deficits and impairments:  Abnormal gait, Decreased endurance, Decreased range of motion, Decreased mobility, Hypomobility, Difficulty walking, Impaired flexibility, Postural dysfunction, Improper body mechanics, Pain  Visit Diagnosis: Acute bilateral low back pain, unspecified whether sciatica present  Sacrococcygeal disorders, not elsewhere classified  Muscle weakness (  generalized)     Problem List Patient Active Problem List   Diagnosis Date Noted   Chronic low back pain 06/01/2021   Fatigue 12/09/2020   BMI 30.0-30.9,adult    Encounter for induction of labor 10/11/2019   Rosacea 12/10/2018   Asthma 11/27/2017   Preventative health care 12/08/2016   Hypothyroidism 05/25/2016    Attention deficit hyperactivity disorder (ADHD) 05/25/2016   Sleep walking disorder 01/22/2015   Dysplasia of cervix, low grade (CIN 1) 11/25/2013   Pap smear abnormality of cervix/human papillomavirus (HPV) positive 11/25/2013   Sleep disorder, circadian, shift work type 06/07/2013   Chronic cough 02/27/2013   Anxiety 01/21/2013   Seasonal allergies 01/21/2013    Jerl Mina, PT 08/30/2021, 1:57 PM  [place] [facility] SERVICES [address] [place], [place], [address] Phone: [phone]   Fax:  [phone]  Name: Zhania Shaheen MRN: [id number] Date of Birth: [birth year]

## 2021-09-02 ENCOUNTER — Other Ambulatory Visit: Payer: Self-pay

## 2021-09-02 ENCOUNTER — Ambulatory Visit (INDEPENDENT_AMBULATORY_CARE_PROVIDER_SITE_OTHER): Payer: 59 | Admitting: Family Medicine

## 2021-09-02 ENCOUNTER — Ambulatory Visit: Payer: 59

## 2021-09-02 ENCOUNTER — Encounter (INDEPENDENT_AMBULATORY_CARE_PROVIDER_SITE_OTHER): Payer: Self-pay | Admitting: Family Medicine

## 2021-09-02 VITALS — BP 115/78 | HR 62 | Temp 97.8°F | Ht 65.0 in | Wt 189.0 lb

## 2021-09-02 DIAGNOSIS — M6281 Muscle weakness (generalized): Secondary | ICD-10-CM

## 2021-09-02 DIAGNOSIS — M545 Low back pain, unspecified: Secondary | ICD-10-CM

## 2021-09-02 DIAGNOSIS — Z6831 Body mass index (BMI) 31.0-31.9, adult: Secondary | ICD-10-CM | POA: Diagnosis not present

## 2021-09-02 DIAGNOSIS — E8881 Metabolic syndrome: Secondary | ICD-10-CM | POA: Diagnosis not present

## 2021-09-02 DIAGNOSIS — R632 Polyphagia: Secondary | ICD-10-CM

## 2021-09-02 DIAGNOSIS — M533 Sacrococcygeal disorders, not elsewhere classified: Secondary | ICD-10-CM | POA: Diagnosis not present

## 2021-09-02 DIAGNOSIS — E669 Obesity, unspecified: Secondary | ICD-10-CM

## 2021-09-02 DIAGNOSIS — Z9189 Other specified personal risk factors, not elsewhere classified: Secondary | ICD-10-CM

## 2021-09-02 MED ORDER — SEMAGLUTIDE-WEIGHT MANAGEMENT 0.25 MG/0.5ML ~~LOC~~ SOAJ
0.2500 mg | SUBCUTANEOUS | 0 refills | Status: DC
Start: 2021-09-02 — End: 2021-09-16
  Filled 2021-09-02 – 2021-09-06 (×2): qty 2, 28d supply, fill #0

## 2021-09-02 NOTE — Therapy (Signed)
[place] MAIN [facility] SERVICES [place], [place], [address] Phone: [phone]   Fax:  [phone]  Physical Therapy Treatment  Patient Details  Name: Christine Adams MRN: [id number] Date of Birth: 04/15/1981 Referring Provider (PT): Dr Izora Ribas   Encounter Date: 09/02/2021   PT End of Session - 09/02/21 1550     Visit Number 4    Number of Visits 16    Date for PT Re-Evaluation 10/19/21    Authorization Type 4/10 eval 2/28    PT Start Time 1639    PT Stop Time 1730    PT Time Calculation (min) 51 min    Activity Tolerance Patient tolerated treatment well    Behavior During Therapy [facility] for tasks assessed/performed             Past Medical History:  Diagnosis Date   ADHD    Asthma    Back pain    BMI 30.0-30.9,adult    Hypothyroidism    Age 38 yrs   Other fatigue    Postpartum anxiety- ongoing    Postpartum depression    Rosacea    Shortness of breath on exertion    Vitamin D deficiency     Past Surgical History:  Procedure Laterality Date   WISDOM TOOTH EXTRACTION      There were no vitals filed for this visit.   Subjective Assessment - 09/02/21 1549     Subjective Patient reports having a busy day at work. No pain today but has apprehension with movement.    Pertinent History Patient is a 41 year old female who presents for lumbar radiculitis. 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 After her back went out in December 2022, had another spasm, went to Dr. Izora Ribas, had two rounds of steroids. Had and MRI which showed: Mild degenerative changes, with disc desiccation and mild disc bulges at L4-L5 and L5-S1.Pain is improving, waxes and wanes. Patient is very active, she rides horses    Limitations Sitting;Lifting;House hold activities;Other (comment)    How long can you sit comfortably? 20 minutes -30 minutes    How long can you stand comfortably? no limitations    How long can you walk  comfortably? no limitations    Diagnostic tests :MRI:  on 12/19  1. Mild degenerative changes, with disc desiccation and mild disc  bulges at L4-L5 and L5-S1.  2. No spinal canal stenosis or neural foraminal narrowing.    Patient Stated Goals not hurt, move better, not limit life because of the back and pelvic pain.    Currently in Pain? No/denies                Manual: Hamstring lengthening stretch 60 seconds each LE ; leg on PT shoulder Piriformis stretch 60 seconds each LE with PT overpressure single knee to chest 60 seconds with each LE.  Cross body knee to opposite shoulder 60 seconds each LE  Adduction lengthening stretch 60 seconds   Prone: Prone press up with mobilization CPA lumbar spine x8 minutes   Palpation of LE's for assessment of trigger points  TherEx Supine:  TrA contraction with posterior pelvic tilt 15x 3 seconds Green swiss ball with TrA contraction between hands and knees 15x 3 second holds Green swiss ball with TrA contraction and UE raises 12x each UE  Green swiss ball with TrA contraction with LE heel slide 12x each LE        Trigger  Point Dry Needling (TDN), unbilled Education performed with patient regarding potential benefit of TDN. Reviewed precautions and risks with patient. Reviewed special precautions/risks over lung fields which include pneumothorax. Reviewed signs and symptoms of pneumothorax and advised pt to go to ER immediately if these symptoms develop advise them of dry needling treatment. Extensive time spent with pt to ensure full understanding of TDN risks. Pt provided verbal consent to treatment. TDN performed to  with 0.3 x 30; 0.3x0.6 single needle placements with local twitch response (LTR). Pistoning technique utilized. Improved pain-free motion following intervention. Muscles targeted: bilateral lumbar paraspinals, obturator internus, adductors, and IT bands  bilaterally for 12 minutes    Pt educated throughout session about proper  posture and technique with exercises. Improved exercise technique, movement at target joints, use of target muscles after min to mod verbal, visual, tactile cues.    Patient presents with limited muscle tissue length of bilateral adductors and IT bands that improved by end of session. Core activation continues to remain limited but does have improvement recruitment compared to prior session indicating carryover. Pt will benefit from skilled PT services to address deficits and return to pain-free function at home and work.                    PT Education - 09/02/21 1550     Education Details exercise technique, body mechanics    Person(s) Educated Patient    Methods Explanation;Tactile cues;Demonstration;Verbal cues    Comprehension Verbalized understanding;Returned demonstration;Verbal cues required;Tactile cues required              PT Short Term Goals - 08/24/21 1500       PT SHORT TERM GOAL #1   Title Patient will be independent in home exercise program to improve strength/mobility for better functional independence with ADLs.    Baseline 2/28: HEP given    Time 4    Period Weeks    Status New    Target Date 09/21/21               PT Long Term Goals - 08/30/21 1355       PT LONG TERM GOAL #1   Title Patient will increase FOTO score to equal to or greater than 40%    to demonstrate statistically significant improvement in mobility and quality of life.    Baseline 2/28: 10    Time 8    Period Weeks    Status New    Target Date 10/19/21      PT LONG TERM GOAL #2   Title Pt will decrease mODI score by at least 13 points (32) in order demonstrate clinically significant reduction in back pain/disability.    Baseline 2/28: 45    Time 8    Period Weeks    Status New    Target Date 10/19/21      PT LONG TERM GOAL #3   Title Pt will decrease worst back pain as reported on NPRS by at least 3 points to 5/10 in order to demonstrate clinically  significant reduction in back pain.    Baseline 2/28: 8/10    Time 8    Period Weeks    Status New    Target Date 10/19/21      PT LONG TERM GOAL #4   Title Patient will return to functional acitvity without muscle guarding or compensation to return to PLOF.    Baseline 2/28: frequently has to modify how to moves and bends to  pick up her child    Time 8    Period Weeks    Status New    Target Date 10/19/21      PT LONG TERM GOAL #5   Title Patient will increase BLE gross strength to 4+/5 as to improve functional strength for independent gait, increased standing tolerance and increased ADL ability.    Baseline 2/28: see note    Time 8    Period Weeks    Status New    Target Date 10/19/21                   Plan - 09/02/21 1553     Clinical Impression Statement Patient presents with limited muscle tissue length of bilateral adductors and IT bands that improved by end of session. Core activation continues to remain limited but does have improvement recruitment compared to prior session indicating carryover. Pt will benefit from skilled PT services to address deficits and return to pain-free function at home and work.    Personal Factors and Comorbidities Age;Comorbidity 3+;Profession;Past/Current Experience;Time since onset of injury/illness/exacerbation    Comorbidities ADHD, asthma, fatigue, sleep disorder, anxiety, allergies, hypothyroidism    Examination-Activity Limitations Bed Mobility;Caring for Others;Bend;Carry;Lift;Sit;Squat;Stairs;Stand;Transfers    Examination-Participation Restrictions Cleaning;Driving;Laundry;Occupation;Shop;Volunteer;Yard Work;Other    Stability/Clinical Decision Making Stable/Uncomplicated    Rehab Potential Fair    PT Frequency 2x / week    PT Duration 8 weeks    PT Treatment/Interventions ADLs/Self Care Home Management;Aquatic Therapy;Cryotherapy;Electrical Stimulation;Moist Heat;Traction;Ultrasound;DME Instruction;Canalith Repostioning;Gait  Scientist, forensic;Therapeutic exercise;Therapeutic activities;Functional mobility training;Balance training;Neuromuscular re-education;Patient/family education;Manual techniques;Passive range of motion;Dry needling;Energy conservation;Splinting;Taping;Vestibular;Joint Manipulations;Spinal Manipulations;Visual/perceptual remediation/compensation    PT Next Visit Plan attempt distraction, core strengthening, mobilization    PT Home Exercise Plan see above    Consulted and Agree with Plan of Care Patient             Patient will benefit from skilled therapeutic intervention in order to improve the following deficits and impairments:  Abnormal gait, Decreased endurance, Decreased range of motion, Decreased mobility, Hypomobility, Difficulty walking, Impaired flexibility, Postural dysfunction, Improper body mechanics, Pain  Visit Diagnosis: Acute bilateral low back pain, unspecified whether sciatica present  Muscle weakness (generalized)     Problem List Patient Active Problem List   Diagnosis Date Noted   Chronic low back pain 06/01/2021   Fatigue 12/09/2020   BMI 30.0-30.9,adult    Encounter for induction of labor 10/11/2019   Rosacea 12/10/2018   Asthma 11/27/2017   Preventative health care 12/08/2016   Hypothyroidism 05/25/2016   Attention deficit hyperactivity disorder (ADHD) 05/25/2016   Sleep walking disorder 01/22/2015   Dysplasia of cervix, low grade (CIN 1) 11/25/2013   Pap smear abnormality of cervix/human papillomavirus (HPV) positive 11/25/2013   Sleep disorder, circadian, shift work type 06/07/2013   Chronic cough 02/27/2013   Anxiety 01/21/2013   Seasonal allergies 01/21/2013    Janna Arch, PT, DPT  09/02/2021, 3:55 PM  [place] MAIN [facility] SERVICES [address] [place], [place], [address] Phone: [phone]   Fax:  [phone]  Name: Christine Adams MRN: [id number] Date of Birth: 12/24/1980

## 2021-09-05 NOTE — Progress Notes (Unsigned)
Chief Complaint:   OBESITY Christine Adams is here to discuss her progress with her obesity treatment plan along with follow-up of her obesity related diagnoses. Christine Adams is on the Category 2 Plan with breakfast options and states she is following her eating plan approximately 75-80% of the time. Christine Adams states she is doing 0 minutes 0 times per week.  Today's visit was #: 3 Starting weight: 195 lbs Starting date: 08/04/2021 Today's weight: 189 lbs Today's date: 09/02/2021 Total lbs lost to date: 6 Total lbs lost since last in-office visit: 4  Interim History: Christine Adams tried long life meal prep, and it was ok. She notes it tasted more like microwave meals. She used [employer] all natural.  Subjective:   1. Insulin resistance Christine Adams started metformin last office visit. No help, and she notes a lot of sweet/carb cravings. She is thinking about food and eating everything on the plan, and she is still hungry.   2. At risk for side effect of medication Christine Adams is at risk for drug side effects due to starting a new medication.  Assessment/Plan:   1. Insulin resistance Christine Adams agreed to discontinue metformin, and start Wegovy 0.25 mg q week with no refills. We will follow up at her next office visit.  - Semaglutide-Weight Management 0.25 MG/0.5ML SOAJ; Inject 0.25 mg into the skin once a week for 28 days.  Dispense: 2 mL; Refill: 0  2. At risk for side effect of medication Due to Christine Adams's current conditions and medications, she is at a higher risk for drug side effect.  At least 9 minutes was spent on counseling her about these concerns today.  We discussed the benefits and potential risks of these medications, and all of patient's concerns were addressed and questions were answered.  she will call us, or their PCP or other specialists who treat their conditions with medications, with any questions or concerns that may develop.     3. Obesity with current BMI of 31.6 Christine Adams is currently in the action  stage of change. As such, her goal is to continue with weight loss efforts. She has agreed to the Category 2 Plan with breakfast and lunch options.   Gave lunch options, discussed [employer] chips for salty, crunchy, and other healthy snack ideas.  Exercise goals: As is.  Behavioral modification strategies: increasing lean protein intake, decreasing simple carbohydrates, and planning for success.  Christine Adams has agreed to follow-up with our clinic in 2 weeks. She was informed of the importance of frequent follow-up visits to maximize her success with intensive lifestyle modifications for her multiple health conditions.   Objective:   Blood pressure 115/78, pulse 62, temperature 97.8 F (36.6 C), temperature source Oral, height '5\' 5"'$  (1.651 m), weight 189 lb (85.7 kg), SpO2 98 %, currently breastfeeding. Body mass index is 31.45 kg/m.  General: Cooperative, alert, well developed, in no acute distress. HEENT: Conjunctivae and lids unremarkable. Cardiovascular: Regular rhythm.  Lungs: Normal work of breathing. Neurologic: No focal deficits.   Lab Results  Component Value Date   CREATININE 0.70 08/04/2021   BUN 13 08/04/2021   NA 140 08/04/2021   K 4.3 08/04/2021   CL 103 08/04/2021   CO2 20 08/04/2021   Lab Results  Component Value Date   ALT 24 08/04/2021   AST 22 08/04/2021   ALKPHOS 92 08/04/2021   BILITOT 0.5 08/04/2021   Lab Results  Component Value Date   HGBA1C 5.1 08/04/2021   HGBA1C 5.2 12/06/2016   Lab Results  Component Value Date   INSULIN 13.3 08/04/2021   Lab Results  Component Value Date   TSH 2.240 08/04/2021   Lab Results  Component Value Date   CHOL 164 08/04/2021   HDL 32 (L) 08/04/2021   LDLCALC 94 08/04/2021   LDLDIRECT 105.0 02/14/2020   TRIG 225 (H) 08/04/2021   CHOLHDL 5 02/14/2020   Lab Results  Component Value Date   VD25OH 57.5 08/04/2021   VD25OH 51.25 12/09/2020   Lab Results  Component Value Date   WBC 6.6 08/04/2021   HGB 15.7  08/04/2021   HCT 47.1 (H) 08/04/2021   MCV 88 08/04/2021   PLT 283 08/04/2021   Lab Results  Component Value Date   IRON 87 12/09/2020   FERRITIN 54.9 12/09/2020   Attestation Statements:   Reviewed by clinician on day of visit: allergies, medications, problem list, medical history, surgical history, family history, social history, and previous encounter notes.   Wilhemena Durie, am acting as transcriptionist for [employer], DO.  I have reviewed the above documentation for accuracy and completeness, and I agree with the above. -  ***

## 2021-09-06 ENCOUNTER — Ambulatory Visit: Payer: 59 | Admitting: Physical Therapy

## 2021-09-06 ENCOUNTER — Encounter (INDEPENDENT_AMBULATORY_CARE_PROVIDER_SITE_OTHER): Payer: Self-pay

## 2021-09-06 ENCOUNTER — Other Ambulatory Visit: Payer: Self-pay

## 2021-09-07 ENCOUNTER — Ambulatory Visit: Payer: 59 | Admitting: Physical Therapy

## 2021-09-07 ENCOUNTER — Other Ambulatory Visit: Payer: Self-pay

## 2021-09-07 DIAGNOSIS — M533 Sacrococcygeal disorders, not elsewhere classified: Secondary | ICD-10-CM

## 2021-09-07 DIAGNOSIS — M545 Low back pain, unspecified: Secondary | ICD-10-CM | POA: Diagnosis not present

## 2021-09-07 DIAGNOSIS — M6281 Muscle weakness (generalized): Secondary | ICD-10-CM | POA: Diagnosis not present

## 2021-09-07 NOTE — Patient Instructions (Signed)
Strengthening feet arches:  ? ? ?Heel raises - heels together, minisquat ? ?Minisquat motion, trunk bent , gaze onto floor like you are looking at your reflection over a lake/pond, ? ?Knees bent pointed out like a "v" , navel ( center of mass) more forward  ?Heels together as you lift, pointed out like a "v"  ?KNEES ARE ALIGNED BEHIND THE TOES TO MINIMIZE STRAIN ON THE KNEES your  navel ( center of mass) more forward to a ?avoid dropping down fast and rocking more weight back onto heels , keep heels pressing against each other the whole time ? ? ?10 reps  ?_______________ ? ?Stretch for pelvic floor  ? ?Happy baby ?1) With strap to reinforce the ballmounds, toe . Knee out  ? ?2) without strap, ext one knee and rock for adductor stretch ? ? ?Mermaid stretch  ?Rocking while seated on the floor with heels to one side of the hip ?Heels to one side of the hip  ?Rock forward towards the knee that is bent , rock beck towards the opposite sitting bones ? ? ? ?___ ? ?

## 2021-09-07 NOTE — Therapy (Signed)
[place] [facility] [facility] ?[place], [place], [address] ?Phone: [phone]   Fax:  [phone] ? ?Physical Therapy Treatment ? ?Patient Details  ?Name: Christine Adams ?MRN: [id number] ?Date of Birth: 1980/07/09 ?Referring Provider (PT): Dr Izora Ribas ? ? ?Encounter Date: 09/07/2021 ? ? PT End of Session - 09/07/21 1510   ? ? Visit Number 5   ? Number of Visits 16   ? Date for PT Re-Evaluation 10/19/21   ? Authorization Type 4/10 eval 2/28   ? PT Start Time 1503   ? PT Stop Time 1600   ? PT Time Calculation (min) 57 min   ? Activity Tolerance Patient tolerated treatment well   ? Behavior During Therapy [facility] for tasks assessed/performed   ? ?  ?  ? ?  ? ? ?Past Medical History:  ?Diagnosis Date  ? ADHD   ? Asthma   ? Back pain   ? BMI 30.0-30.9,adult   ? Hypothyroidism   ? Age 41 yrs  ? Other fatigue   ? Postpartum anxiety- ongoing   ? Postpartum depression   ? Rosacea   ? Shortness of breath on exertion   ? Vitamin D deficiency   ? ? ?Past Surgical History:  ?Procedure Laterality Date  ? WISDOM TOOTH EXTRACTION    ? ? ?There were no vitals filed for this visit. ? ? Subjective Assessment - 09/07/21 1509   ? ? Subjective Pt has had no pain for 1 week. Pt has been paying attention to how she picks up her son from the floor and sit to stand.   ? Pertinent History Patient is a 41 year old female who presents for lumbar radiculitis. 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 After her back went out in December 2022, had another spasm, went to Dr. Izora Ribas, had two rounds of steroids. Had and MRI which showed: Mild degenerative changes, with disc desiccation and mild disc bulges at L4-L5 and L5-S1.Pain is improving, waxes and wanes. Patient is very active, she rides horses   ? Limitations Sitting;Lifting;House hold activities;Other (comment)   ? How long can you sit comfortably? 20 minutes -30 minutes   ? How long can you stand comfortably? no limitations    ? How long can you walk comfortably? no limitations   ? Diagnostic tests :MRI:  on 12/19  1. Mild degenerative changes, with disc desiccation and mild disc  bulges at L4-L5 and L5-S1.  2. No spinal canal stenosis or neural foraminal narrowing.   ? Patient Stated Goals not hurt, move better, not limit life because of the back and pelvic pain.   ? ?  ?  ? ?  ? ? ? ? ? [facility] PT Assessment - 09/07/21 1510   ? ?  ? AROM  ? Overall AROM Comments figure 4 on LE L 67 cm, R 64 cm lateral tib plateau to floor   ?  ? Strength  ? Overall Strength Comments R knee ext 4-/5, LLE hip /knee flexion/ ext 5/5   ?  ? Palpation  ? SI assessment  levelled iliac crest   ? ?  ?  ? ?  ? ? ? ? ? ? ? ? ? ? ? ? ? Pelvic Floor Special Questions - 09/07/21 1653   ? ? External Perineal Exam through clothing, R SIJ hypomobile, tightness at B thigh, feet limited in B DF/EV   ? ?  ?  ? ?  ? ? ? ?  [facility] Adult PT Treatment/Exercise - 09/07/21 1639   ? ?  ? Neuro Re-ed   ? Neuro Re-ed Details  cued for DF/EV in new HEP and knee alignment,   ?  ? Modalities  ? Modalities Moist Heat   ?  ? Moist Heat Therapy  ? Number Minutes Moist Heat 7 Minutes   unbilled,  ? Moist Heat Location --   hamstring./ adductor, SIJ in mermaid position to ER hips and knees.  with guided restorative yoga principles for stretching pelvic floor and SIJ realignment  ?  ? Manual Therapy  ? Manual therapy comments long axis distraction RLE, rotational mob , STM/MWM at glut med/ minimal, ITband L LE , STM/MWM at [facility] joint, tob fib joint L   ? ?  ?  ? ?  ? ? ? ? ? ? ? ? ? ? ? ? PT Short Term Goals - 08/24/21 1500   ? ?  ? PT SHORT TERM GOAL #1  ? Title Patient will be independent in home exercise program to improve strength/mobility for better functional independence with ADLs.   ? Baseline 2/28: HEP given   ? Time 4   ? Period Weeks   ? Status New   ? Target Date 09/21/21   ? ?  ?  ? ?  ? ? ? ? PT Long Term Goals - 08/30/21 1355   ? ?  ? PT LONG TERM GOAL #1  ? Title Patient will  increase FOTO score to equal to or greater than 40%    to demonstrate statistically significant improvement in mobility and quality of life.   ? Baseline 2/28: 10   ? Time 8   ? Period Weeks   ? Status New   ? Target Date 10/19/21   ?  ? PT LONG TERM GOAL #2  ? Title Pt will decrease mODI score by at least 13 points (32) in order demonstrate clinically significant reduction in back pain/disability.   ? Baseline 2/28: 45   ? Time 8   ? Period Weeks   ? Status New   ? Target Date 10/19/21   ?  ? PT LONG TERM GOAL #3  ? Title Pt will decrease worst back pain as reported on NPRS by at least 3 points to 5/10 in order to demonstrate clinically significant reduction in back pain.   ? Baseline 2/28: 8/10   ? Time 8   ? Period Weeks   ? Status New   ? Target Date 10/19/21   ?  ? PT LONG TERM GOAL #4  ? Title Patient will return to functional acitvity without muscle guarding or compensation to return to PLOF.   ? Baseline 2/28: frequently has to modify how to moves and bends to pick up her child   ? Time 8   ? Period Weeks   ? Status New   ? Target Date 10/19/21   ?  ? PT LONG TERM GOAL #5  ? Title Patient will increase BLE gross strength to 4+/5 as to improve functional strength for independent gait, increased standing tolerance and increased ADL ability.   ? Baseline 2/28: see note   ? Time 8   ? Period Weeks   ? Status New   ? Target Date 10/19/21   ? ?  ?  ? ?  ? ? ? ? ? ? ? ? Plan - 09/07/21 1618   ? ? Clinical Impression Statement Pt showed good carryover with levelled pelvic  girdle and spinal alignment compared to previous session. Manual Tx helped ot further mobilize R SIJ and pelvic floor mm, and B adductor and feet mm to promote DF/EV/ toe abduction. 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  ?Pt continues to benefit from skilled PT.   ? Personal  Factors and Comorbidities Age;Comorbidity 3+;Profession;Past/Current Experience;Time since onset of injury/illness/exacerbation   ? Comorbidities ADHD, asthma, fatigue, sleep disorder, anxiety, allergies, hypothyroidism   ? Examination-Activity Limitations Bed Mobility;Caring for Others;Bend;Carry;Lift;Sit;Squat;Stairs;Stand;Transfers   ? Examination-Participation Restrictions Cleaning;Driving;Laundry;Occupation;Shop;Volunteer;Yard Work;Other   ? Stability/Clinical Decision Making Stable/Uncomplicated   ? Rehab Potential Fair   ? PT Frequency 2x / week   ? PT Duration 8 weeks   ? PT Treatment/Interventions ADLs/Self Care Home Management;Aquatic Therapy;Cryotherapy;Electrical Stimulation;Moist Heat;Traction;Ultrasound;DME Instruction;Canalith Repostioning;Gait Scientist, forensic;Therapeutic exercise;Therapeutic activities;Functional mobility training;Balance training;Neuromuscular re-education;Patient/family education;Manual techniques;Passive range of motion;Dry needling;Energy conservation;Splinting;Taping;Vestibular;Joint Manipulations;Spinal Manipulations;Visual/perceptual remediation/compensation   ? PT Next Visit Plan attempt distraction, core strengthening, mobilization   ? PT Home Exercise Plan see above   ? Consulted and Agree with Plan of Care Patient   ? ?  ?  ? ?  ? ? ?Patient will benefit from skilled therapeutic intervention in order to improve the following deficits and impairments:  Abnormal gait, Decreased endurance, Decreased range of motion, Decreased mobility, Hypomobility, Difficulty walking, Impaired flexibility, Postural dysfunction, Improper body mechanics, Pain ? ?Visit Diagnosis: ?Acute bilateral low back pain, unspecified whether sciatica present ? ?Muscle weakness (generalized) ? ?Sacrococcygeal disorders, not elsewhere classified ? ? ? ? ?Problem List ?Patient Active Problem List  ? Diagnosis Date Noted  ? Chronic low back pain 06/01/2021  ? Fatigue 12/09/2020  ? BMI 30.0-30.9,adult    ? Encounter for induction of labor 10/11/2019  ? Rosacea 12/10/2018  ? Asthma 11/27/2017  ? Preventative health care 12/08/2016  ? Hypothyroidism 05/25/2016  ? Attention deficit hyperactivity disorder

## 2021-09-08 ENCOUNTER — Other Ambulatory Visit: Payer: Self-pay

## 2021-09-09 ENCOUNTER — Ambulatory Visit: Payer: 59

## 2021-09-09 ENCOUNTER — Other Ambulatory Visit: Payer: Self-pay

## 2021-09-09 DIAGNOSIS — M6281 Muscle weakness (generalized): Secondary | ICD-10-CM | POA: Diagnosis not present

## 2021-09-09 DIAGNOSIS — M533 Sacrococcygeal disorders, not elsewhere classified: Secondary | ICD-10-CM | POA: Diagnosis not present

## 2021-09-09 DIAGNOSIS — M545 Low back pain, unspecified: Secondary | ICD-10-CM | POA: Diagnosis not present

## 2021-09-09 NOTE — Therapy (Signed)
[facility] ?[place], [place], [address] ?Phone: [phone]   Fax:  [phone] ? ?Physical Therapy Treatment ? ?Patient Details  ?Name: Christine Adams ?MRN: [id number] ?Date of Birth: 1980-07-23 ?Referring Provider (PT): Dr Izora Ribas ? ? ?Encounter Date: 09/09/2021 ? ? PT End of Session - 09/09/21 1602   ? ? Visit Number 6   ? Number of Visits 16   ? Date for PT Re-Evaluation 10/19/21   ? Authorization Type 6/10 eval 2/28   ? PT Start Time 1600   ? PT Stop Time 0938   ? PT Time Calculation (min) 46 min   ? Activity Tolerance Patient tolerated treatment well   ? Behavior During Therapy [facility] for tasks assessed/performed   ? ?  ?  ? ?  ? ? ?Past Medical History:  ?Diagnosis Date  ? ADHD   ? Asthma   ? Back pain   ? BMI 30.0-30.9,adult   ? Hypothyroidism   ? Age 7 yrs  ? Other fatigue   ? Postpartum anxiety- ongoing   ? Postpartum depression   ? Rosacea   ? Shortness of breath on exertion   ? Vitamin D deficiency   ? ? ?Past Surgical History:  ?Procedure Laterality Date  ? WISDOM TOOTH EXTRACTION    ? ? ?There were no vitals filed for this visit. ? ? Subjective Assessment - 09/09/21 1601   ? ? Subjective Patient reports she has been having pain in her low back. Is feeling very stiff and sore today.   ? Pertinent History Patient is a 41 year old female who presents for lumbar radiculitis. 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 After her back went out in December 2022, had another spasm, went to Dr. Izora Ribas, had two rounds of steroids. Had and MRI which showed: Mild degenerative changes, with disc desiccation and mild disc bulges at L4-L5 and L5-S1.Pain is improving, waxes and wanes. Patient is very active, she rides horses   ? Limitations Sitting;Lifting;House hold activities;Other (comment)   ? How long can you sit comfortably? 20 minutes -30 minutes   ? How long can you stand comfortably? no limitations   ? How long can you  walk comfortably? no limitations   ? Diagnostic tests :MRI:  on 12/19  1. Mild degenerative changes, with disc desiccation and mild disc  bulges at L4-L5 and L5-S1.  2. No spinal canal stenosis or neural foraminal narrowing.   ? Patient Stated Goals not hurt, move better, not limit life because of the back and pelvic pain.   ? Currently in Pain? Yes   ? Pain Score 2    ? Pain Location Back   ? Pain Orientation Lower   ? Pain Descriptors / Indicators Aching   ? Pain Type Chronic pain   ? ?  ?  ? ?  ? ? ? ? ? ? ? ?Manual: ?Hamstring lengthening stretch 60 seconds each LE ; leg on PT shoulder ?Piriformis stretch 60 seconds each LE with PT overpressure ?single knee to chest 60 seconds with each LE.  ?Cross body knee to opposite shoulder 60 seconds each LE ? Adduction lengthening stretch 60 seconds  ?  ?Prone: ?Grade II mobilizations to lumbar and thoracic UPA and CPA x 6 minutes  ?  ?Palpation of LE's for assessment of trigger points ?  ?TherEx ?Supine:  ?TrA contraction with posterior pelvic tilt 15x 3 seconds ?Green Stage manager with  TrA contraction between hands and knees 15x 3 second holds ?Green swiss ball with TrA contraction and UE raises 12x each UE  ?Green swiss ball with TrA contraction with UE and contralateral LE heel slide 12x each LE  ? sitting on large swiss ball x2 minutes for core activation ?Sitting on swiss ball UE raises 10x each UE  ?Sitting on swiss ball LE kicks 10x each LE ?Sitting on plinth table:  ?-forward trunk rollout large swiss ball 10x 10 second holds ?-posterior anterior pelvic tilts 10x ?  ?  ?Trigger Point Dry Needling (TDN), unbilled ?Education performed with patient regarding potential benefit of TDN. Reviewed precautions and risks with patient. Reviewed special precautions/risks over lung fields which include pneumothorax. Reviewed signs and symptoms of pneumothorax and advised pt to go to ER immediately if these symptoms develop advise them of dry needling treatment. Extensive time  spent with pt to ensure full understanding of TDN risks. Pt provided verbal consent to treatment. TDN performed to  with 0.3 x 30; 0.3x0.6 single needle placements with local twitch response (LTR). Pistoning technique utilized. Improved pain-free motion following intervention. Muscles targeted: bilateral lumbar paraspinals, obturator internus, bilateral calves  bilaterally for 6 minutes  ?  ?Pt educated throughout session about proper posture and technique with exercises. Improved exercise technique, movement at target joints, use of target muscles after min to mod verbal, visual, tactile cues. ? ?Patient tolerated progressive core stabilization after low back pain is reduced. She is highly motivated throughout session and reports feeling much better by end of session. Addition of pelvic control tilts added to HEP. Multiple trigger points found in bilateral lumbar paraspinals and calves. Pt will benefit from skilled PT services to address deficits and return to pain-free function at home and work. ? ? ? ? ? ? ? ? ? ? ? ? ? ? ? ? ? ? ? ? PT Education - 09/09/21 1602   ? ? Education Details exercise technique, body mechanics   ? Person(s) Educated Patient   ? Methods Demonstration;Explanation;Tactile cues;Verbal cues   ? Comprehension Verbalized understanding;Returned demonstration;Verbal cues required;Tactile cues required   ? ?  ?  ? ?  ? ? ? PT Short Term Goals - 08/24/21 1500   ? ?  ? PT SHORT TERM GOAL #1  ? Title Patient will be independent in home exercise program to improve strength/mobility for better functional independence with ADLs.   ? Baseline 2/28: HEP given   ? Time 4   ? Period Weeks   ? Status New   ? Target Date 09/21/21   ? ?  ?  ? ?  ? ? ? ? PT Long Term Goals - 08/30/21 1355   ? ?  ? PT LONG TERM GOAL #1  ? Title Patient will increase FOTO score to equal to or greater than 40%    to demonstrate statistically significant improvement in mobility and quality of life.   ? Baseline 2/28: 10   ? Time 8    ? Period Weeks   ? Status New   ? Target Date 10/19/21   ?  ? PT LONG TERM GOAL #2  ? Title Pt will decrease mODI score by at least 13 points (32) in order demonstrate clinically significant reduction in back pain/disability.   ? Baseline 2/28: 45   ? Time 8   ? Period Weeks   ? Status New   ? Target Date 10/19/21   ?  ? PT LONG TERM GOAL #3  ?  Title Pt will decrease worst back pain as reported on NPRS by at least 3 points to 5/10 in order to demonstrate clinically significant reduction in back pain.   ? Baseline 2/28: 8/10   ? Time 8   ? Period Weeks   ? Status New   ? Target Date 10/19/21   ?  ? PT LONG TERM GOAL #4  ? Title Patient will return to functional acitvity without muscle guarding or compensation to return to PLOF.   ? Baseline 2/28: frequently has to modify how to moves and bends to pick up her child   ? Time 8   ? Period Weeks   ? Status New   ? Target Date 10/19/21   ?  ? PT LONG TERM GOAL #5  ? Title Patient will increase BLE gross strength to 4+/5 as to improve functional strength for independent gait, increased standing tolerance and increased ADL ability.   ? Baseline 2/28: see note   ? Time 8   ? Period Weeks   ? Status New   ? Target Date 10/19/21   ? ?  ?  ? ?  ? ? ? ? ? ? ? ? Plan - 09/09/21 1657   ? ? Clinical Impression Statement Patient tolerated progressive core stabilization after low back pain is reduced. She is highly motivated throughout session and reports feeling much better by end of session. Addition of pelvic control tilts added to HEP. Multiple trigger points found in bilateral lumbar paraspinals and calves. Pt will benefit from skilled PT services to address deficits and return to pain-free function at home and work.   ? Personal Factors and Comorbidities Age;Comorbidity 3+;Profession;Past/Current Experience;Time since onset of injury/illness/exacerbation   ? Comorbidities ADHD, asthma, fatigue, sleep disorder, anxiety, allergies, hypothyroidism   ? Examination-Activity  Limitations Bed Mobility;Caring for Others;Bend;Carry;Lift;Sit;Squat;Stairs;Stand;Transfers   ? Examination-Participation Restrictions Cleaning;Driving;Laundry;Occupation;Shop;Volunteer;Yard Work;Other   ? Sta

## 2021-09-10 ENCOUNTER — Other Ambulatory Visit: Payer: Self-pay

## 2021-09-10 DIAGNOSIS — F4322 Adjustment disorder with anxiety: Secondary | ICD-10-CM | POA: Diagnosis not present

## 2021-09-10 DIAGNOSIS — F902 Attention-deficit hyperactivity disorder, combined type: Secondary | ICD-10-CM | POA: Diagnosis not present

## 2021-09-10 MED ORDER — AMPHETAMINE-DEXTROAMPHET ER 10 MG PO CP24
ORAL_CAPSULE | ORAL | 0 refills | Status: DC
  Filled 2021-10-22 – 2021-10-26 (×2): qty 30, 30d supply, fill #0

## 2021-09-10 MED ORDER — AMPHETAMINE-DEXTROAMPHET ER 10 MG PO CP24
ORAL_CAPSULE | ORAL | 0 refills | Status: DC
  Filled 2021-09-10: qty 30, 30d supply, fill #0

## 2021-09-10 MED ORDER — SERTRALINE HCL 50 MG PO TABS
ORAL_TABLET | ORAL | 2 refills | Status: DC
  Filled 2021-09-10: qty 45, 30d supply, fill #0
  Filled 2021-11-16: qty 45, 30d supply, fill #1

## 2021-09-10 MED ORDER — AMPHETAMINE-DEXTROAMPHET ER 10 MG PO CP24
ORAL_CAPSULE | ORAL | 0 refills | Status: DC
  Filled 2021-12-06: qty 30, 30d supply, fill #0

## 2021-09-16 ENCOUNTER — Encounter (INDEPENDENT_AMBULATORY_CARE_PROVIDER_SITE_OTHER): Payer: Self-pay | Admitting: Family Medicine

## 2021-09-16 ENCOUNTER — Other Ambulatory Visit: Payer: Self-pay

## 2021-09-16 ENCOUNTER — Ambulatory Visit: Payer: 59

## 2021-09-16 ENCOUNTER — Ambulatory Visit (INDEPENDENT_AMBULATORY_CARE_PROVIDER_SITE_OTHER): Payer: 59 | Admitting: Family Medicine

## 2021-09-16 VITALS — BP 123/84 | HR 74 | Temp 98.4°F | Ht 65.0 in | Wt 188.0 lb

## 2021-09-16 DIAGNOSIS — M545 Low back pain, unspecified: Secondary | ICD-10-CM

## 2021-09-16 DIAGNOSIS — F430 Acute stress reaction: Secondary | ICD-10-CM

## 2021-09-16 DIAGNOSIS — M533 Sacrococcygeal disorders, not elsewhere classified: Secondary | ICD-10-CM

## 2021-09-16 DIAGNOSIS — Z6831 Body mass index (BMI) 31.0-31.9, adult: Secondary | ICD-10-CM | POA: Diagnosis not present

## 2021-09-16 DIAGNOSIS — M6281 Muscle weakness (generalized): Secondary | ICD-10-CM | POA: Diagnosis not present

## 2021-09-16 DIAGNOSIS — Z9189 Other specified personal risk factors, not elsewhere classified: Secondary | ICD-10-CM

## 2021-09-16 DIAGNOSIS — E8881 Metabolic syndrome: Secondary | ICD-10-CM | POA: Diagnosis not present

## 2021-09-16 DIAGNOSIS — E669 Obesity, unspecified: Secondary | ICD-10-CM

## 2021-09-16 MED ORDER — SEMAGLUTIDE-WEIGHT MANAGEMENT 0.5 MG/0.5ML ~~LOC~~ SOAJ
0.5000 mg | SUBCUTANEOUS | 0 refills | Status: DC
  Filled 2021-09-16: qty 2, 30d supply, fill #0

## 2021-09-16 NOTE — Therapy (Signed)
[facility] ?[place], [place], [address] ?Phone: [phone]   Fax:  [phone] ? ?Physical Therapy Treatment ? ?Patient Details  ?Name: Christine Adams ?MRN: [id number] ?Date of Birth: [birth year] ?Referring Provider (PT): Dr Izora Ribas ? ? ?Encounter Date: 09/16/2021 ? ? PT End of Session - 09/16/21 1533   ? ? Visit Number 7   ? Number of Visits 16   ? Date for PT Re-Evaluation 10/19/21   ? Authorization Type 7/10 eval 2/28   ? PT Start Time 1430   ? PT Stop Time 1515   ? PT Time Calculation (min) 45 min   ? Activity Tolerance Patient tolerated treatment well   ? Behavior During Therapy [facility] for tasks assessed/performed   ? ?  ?  ? ?  ? ? ?Past Medical History:  ?Diagnosis Date  ? ADHD   ? Asthma   ? Back pain   ? BMI 30.0-30.9,adult   ? Hypothyroidism   ? Age 41 yrs  ? Other fatigue   ? Postpartum anxiety- ongoing   ? Postpartum depression   ? Rosacea   ? Shortness of breath on exertion   ? Vitamin D deficiency   ? ? ?Past Surgical History:  ?Procedure Laterality Date  ? WISDOM TOOTH EXTRACTION    ? ? ?There were no vitals filed for this visit. ? ? Subjective Assessment - 09/16/21 1529   ? ? Subjective Patient was able to ride her horse since last session. No falls or LOB since last session, no pain today just stiffness   ? Pertinent History Patient is a 41 year old female who presents for lumbar radiculitis. 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 After her back went out in December 2022, had another spasm, went to Dr. Izora Ribas, had two rounds of steroids. Had and MRI which showed: Mild degenerative changes, with disc desiccation and mild disc bulges at L4-L5 and L5-S1.Pain is improving, waxes and wanes. Patient is very active, she rides horses   ? Limitations Sitting;Lifting;House hold activities;Other (comment)   ? How long can you sit comfortably? 20 minutes -30 minutes   ? How long can you stand comfortably? no limitations    ? How long can you walk comfortably? no limitations   ? Diagnostic tests :MRI:  on 12/19  1. Mild degenerative changes, with disc desiccation and mild disc  bulges at L4-L5 and L5-S1.  2. No spinal canal stenosis or neural foraminal narrowing.   ? Patient Stated Goals not hurt, move better, not limit life because of the back and pelvic pain.   ? Currently in Pain? No/denies   ? ?  ?  ? ?  ? ? ? ? ? ? ? ? ? ? ?  ?Manual: ?Hamstring lengthening stretch 60 seconds each LE ; leg on PT shoulder ?Piriformis stretch 60 seconds each LE with PT overpressure ?single knee to chest 60 seconds with each LE.  ?Cross body knee to opposite shoulder 60 seconds each LE ? Adduction lengthening stretch 60 seconds  ?  ?  ?Palpation of LE's for assessment of trigger points ?  ?TherEx ?Supine:  ?Posterior pelvic tilt with adduction ball squeeze 15x 3 second holds ?Posterior pelvic tilt adduction ball squeeze bridge 15x ?Tabletop 90 90 leg drop 10x each LE  ?Scissor kicks modified 10x  ? ?Seated ?Hamstring lengthening stretch 30 seconds each LE ?Piriformis stretch 30 seconds each LE  ?  ?Trigger Point  Dry Needling (TDN), unbilled ?Education performed with patient regarding potential benefit of TDN. Reviewed precautions and risks with patient. Reviewed special precautions/risks over lung fields which include pneumothorax. Reviewed signs and symptoms of pneumothorax and advised pt to go to ER immediately if these symptoms develop advise them of dry needling treatment. Extensive time spent with pt to ensure full understanding of TDN risks. Pt provided verbal consent to treatment. TDN performed to  with 0.3 x 30; 0.3x0.6 single needle placements with local twitch response (LTR). Pistoning technique utilized. Improved pain-free motion following intervention. Muscles targeted: bilateral lumbar paraspinals, obturator internus, bilateral calves  bilaterally for 6 minutes  ?  ?Pt educated throughout session about proper posture and technique with  exercises. Improved exercise technique, movement at target joints, use of target muscles after min to mod verbal, visual, tactile cues. ?  ? ? ? ?Patient presents with increased muscle tension of piriformis and obturator internus. Multiple trigger points found in quadratus lumborum with patient reporting relief by end of session. Core strengthening is very fatiguing for patient requiring multiple rest breaks. Pt will benefit from skilled PT services to address deficits and return to pain-free function at home and work. ? ? ? ? ? ? ? ? ? ? ? ? ? ? PT Education - 09/16/21 1533   ? ? Education Details exercise technique, body mechanics   ? Person(s) Educated Patient   ? Methods Explanation;Demonstration;Tactile cues;Verbal cues   ? Comprehension Verbalized understanding;Returned demonstration;Verbal cues required;Tactile cues required   ? ?  ?  ? ?  ? ? ? PT Short Term Goals - 08/24/21 1500   ? ?  ? PT SHORT TERM GOAL #1  ? Title Patient will be independent in home exercise program to improve strength/mobility for better functional independence with ADLs.   ? Baseline 2/28: HEP given   ? Time 4   ? Period Weeks   ? Status New   ? Target Date 09/21/21   ? ?  ?  ? ?  ? ? ? ? PT Long Term Goals - 08/30/21 1355   ? ?  ? PT LONG TERM GOAL #1  ? Title Patient will increase FOTO score to equal to or greater than 40%    to demonstrate statistically significant improvement in mobility and quality of life.   ? Baseline 2/28: 10   ? Time 8   ? Period Weeks   ? Status New   ? Target Date 10/19/21   ?  ? PT LONG TERM GOAL #2  ? Title Pt will decrease mODI score by at least 13 points (32) in order demonstrate clinically significant reduction in back pain/disability.   ? Baseline 2/28: 45   ? Time 8   ? Period Weeks   ? Status New   ? Target Date 10/19/21   ?  ? PT LONG TERM GOAL #3  ? Title Pt will decrease worst back pain as reported on NPRS by at least 3 points to 5/10 in order to demonstrate clinically significant reduction in  back pain.   ? Baseline 2/28: 8/10   ? Time 8   ? Period Weeks   ? Status New   ? Target Date 10/19/21   ?  ? PT LONG TERM GOAL #4  ? Title Patient will return to functional acitvity without muscle guarding or compensation to return to PLOF.   ? Baseline 2/28: frequently has to modify how to moves and bends to pick up her child   ?  Time 8   ? Period Weeks   ? Status New   ? Target Date 10/19/21   ?  ? PT LONG TERM GOAL #5  ? Title Patient will increase BLE gross strength to 4+/5 as to improve functional strength for independent gait, increased standing tolerance and increased ADL ability.   ? Baseline 2/28: see note   ? Time 8   ? Period Weeks   ? Status New   ? Target Date 10/19/21   ? ?  ?  ? ?  ? ? ? ? ? ? ? ? Plan - 09/16/21 1535   ? ? Clinical Impression Statement Patient presents with increased muscle tension of piriformis and obturator internus. Multiple trigger points found in quadratus lumborum with patient reporting relief by end of session. Core strengthening is very fatiguing for patient requiring multiple rest breaks. Pt will benefit from skilled PT services to address deficits and return to pain-free function at home and work.   ? Personal Factors and Comorbidities Age;Comorbidity 3+;Profession;Past/Current Experience;Time since onset of injury/illness/exacerbation   ? Comorbidities ADHD, asthma, fatigue, sleep disorder, anxiety, allergies, hypothyroidism   ? Examination-Activity Limitations Bed Mobility;Caring for Others;Bend;Carry;Lift;Sit;Squat;Stairs;Stand;Transfers   ? Examination-Participation Restrictions Cleaning;Driving;Laundry;Occupation;Shop;Volunteer;Yard Work;Other   ? Stability/Clinical Decision Making Stable/Uncomplicated   ? Rehab Potential Fair   ? PT Frequency 2x / week   ? PT Duration 8 weeks   ? PT Treatment/Interventions ADLs/Self Care Home Management;Aquatic Therapy;Cryotherapy;Electrical Stimulation;Moist Heat;Traction;Ultrasound;DME Instruction;Canalith Repostioning;Gait  Scientist, forensic;Therapeutic exercise;Therapeutic activities;Functional mobility training;Balance training;Neuromuscular re-education;Patient/family education;Manual techniques;Passive range of motion;

## 2021-09-20 ENCOUNTER — Telehealth: Payer: Self-pay | Admitting: Physical Therapy

## 2021-09-20 ENCOUNTER — Other Ambulatory Visit: Payer: Self-pay

## 2021-09-20 ENCOUNTER — Ambulatory Visit: Payer: 59 | Admitting: Physical Therapy

## 2021-09-20 NOTE — Telephone Encounter (Signed)
Therapist asked administrative assistant to call pt after 15 min passed for her 12 pt appt today to confirm.  Pt reported she forgot about her appt and would not be able to come.  ?

## 2021-09-23 ENCOUNTER — Ambulatory Visit: Payer: 59

## 2021-09-27 ENCOUNTER — Ambulatory Visit: Payer: 59 | Attending: Neurosurgery

## 2021-09-27 DIAGNOSIS — M6281 Muscle weakness (generalized): Secondary | ICD-10-CM | POA: Diagnosis not present

## 2021-09-27 DIAGNOSIS — M533 Sacrococcygeal disorders, not elsewhere classified: Secondary | ICD-10-CM | POA: Diagnosis not present

## 2021-09-27 DIAGNOSIS — M545 Low back pain, unspecified: Secondary | ICD-10-CM | POA: Diagnosis not present

## 2021-09-27 NOTE — Therapy (Signed)
[place] ?[place], [place], [address] ?Phone: [phone]   Fax:  [phone] ? ?Physical Therapy Treatment ? ?Patient Details  ?Name: Christine Adams ?MRN: [id number] ?Date of Birth: [birth year] ?Referring Provider (PT): Dr Izora Ribas ? ? ?Encounter Date: 09/27/2021 ? ? PT End of Session - 09/27/21 0831   ? ? Visit Number 8   ? Number of Visits 16   ? Date for PT Re-Evaluation 10/19/21   ? Authorization Type 8/10 eval 2/28   ? PT Start Time [phone]   ? PT Stop Time 0845   ? PT Time Calculation (min) 41 min   ? Activity Tolerance Patient tolerated treatment well   ? Behavior During Therapy [facility] for tasks assessed/performed   ? ?  ?  ? ?  ? ? ?Past Medical History:  ?Diagnosis Date  ? ADHD   ? Asthma   ? Back pain   ? BMI 30.0-30.9,adult   ? Hypothyroidism   ? Age 41 yrs  ? Other fatigue   ? Postpartum anxiety- ongoing   ? Postpartum depression   ? Rosacea   ? Shortness of breath on exertion   ? Vitamin D deficiency   ? ? ?Past Surgical History:  ?Procedure Laterality Date  ? WISDOM TOOTH EXTRACTION    ? ? ?There were no vitals filed for this visit. ? ? Subjective Assessment - 09/27/21 0829   ? ? Subjective Patient helped pick up sticks and did a lot of manual labor over the weekend. Is feeling very stiff.   ? Pertinent History Patient is a 41 year old female who presents for lumbar radiculitis. 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 After her back went out in December 2022, had another spasm, went to Dr. Izora Ribas, had two rounds of steroids. Had and MRI which showed: Mild degenerative changes, with disc desiccation and mild disc bulges at L4-L5 and L5-S1.Pain is improving, waxes and wanes. Patient is very active, she rides horses   ? Limitations Sitting;Lifting;House hold activities;Other (comment)   ? How long can you sit comfortably? 20 minutes -30 minutes   ? How long can you stand comfortably? no limitations   ? How long can you  walk comfortably? no limitations   ? Diagnostic tests :MRI:  on 12/19  1. Mild degenerative changes, with disc desiccation and mild disc  bulges at L4-L5 and L5-S1.  2. No spinal canal stenosis or neural foraminal narrowing.   ? Patient Stated Goals not hurt, move better, not limit life because of the back and pelvic pain.   ? Currently in Pain? Yes   ? Pain Score 1    ? Pain Location Back   ? Pain Orientation Lower   ? Pain Descriptors / Indicators Aching   ? Pain Type Chronic pain   ? ?  ?  ? ?  ? ? ? ? ? ? ? ? ? ? ? ? ? ? ?Manual: ?Hamstring lengthening stretch 60 seconds each LE ; leg on PT shoulder ?Piriformis stretch 60 seconds each LE with PT overpressure ?single knee to chest 60 seconds with each LE.  ?Cross body knee to opposite shoulder 60 seconds each LE ? Adduction lengthening stretch 60 seconds  ?  ?Prone: ?Grade II mobilizations to lumbar and thoracic UPA and CPA x 6 minutes  ?  ?Palpation of LE's for assessment of trigger points ?  ?TherEx ?Supine:  ?TrA contraction with posterior pelvic  tilt 15x 3 seconds ?TrA contraction with posterior pelvic tilt and adduction 12x 3 second holds ?Green swiss ball with TrA contraction between hands and knees 15x 3 second holds ?Green swiss ball with TrA contraction and UE raises 12x each UE  ?Green swiss ball with TrA contraction with UE and contralateral LE heel slide 12x each LE  ? ?-posterior anterior pelvic tilts 10x ?  ?  ?Trigger Point Dry Needling (TDN), unbilled ?Education performed with patient regarding potential benefit of TDN. Reviewed precautions and risks with patient. Reviewed special precautions/risks over lung fields which include pneumothorax. Reviewed signs and symptoms of pneumothorax and advised pt to go to ER immediately if these symptoms develop advise them of dry needling treatment. Extensive time spent with pt to ensure full understanding of TDN risks. Pt provided verbal consent to treatment. TDN performed to  with 0.3 x 30; 0.3x0.6 single  needle placements with local twitch response (LTR). Pistoning technique utilized. Improved pain-free motion following intervention. Muscles targeted: bilateral lumbar paraspinals, obturator internus, piriformis bilaterally for 3 minutes  ?  ?Pt educated throughout session about proper posture and technique with exercises. Improved exercise technique, movement at target joints, use of target muscles after min to mod verbal, visual, tactile cues. ? ? ?Patient tolerates progressive core stabilization in combination with muscle tissue lengthening techniques. She has significant increase in tension due to manual labor over the weekend. Significant trigger points found in lumbar paraspinals and piriformis region. Pt will benefit from skilled PT services to address deficits and return to pain-free function at home and work. ? ? ? ? ? ? ? ? ? ? ? PT Education - 09/27/21 0830   ? ? Education Details exercise technique, body mechanics   ? Person(s) Educated Patient   ? Methods Explanation;Demonstration;Tactile cues;Verbal cues   ? Comprehension Returned demonstration;Verbal cues required;Tactile cues required;Verbalized understanding   ? ?  ?  ? ?  ? ? ? PT Short Term Goals - 08/24/21 1500   ? ?  ? PT SHORT TERM GOAL #1  ? Title Patient will be independent in home exercise program to improve strength/mobility for better functional independence with ADLs.   ? Baseline 2/28: HEP given   ? Time 4   ? Period Weeks   ? Status New   ? Target Date 09/21/21   ? ?  ?  ? ?  ? ? ? ? PT Long Term Goals - 08/30/21 1355   ? ?  ? PT LONG TERM GOAL #1  ? Title Patient will increase FOTO score to equal to or greater than 40%    to demonstrate statistically significant improvement in mobility and quality of life.   ? Baseline 2/28: 10   ? Time 8   ? Period Weeks   ? Status New   ? Target Date 10/19/21   ?  ? PT LONG TERM GOAL #2  ? Title Pt will decrease mODI score by at least 13 points (32) in order demonstrate clinically significant  reduction in back pain/disability.   ? Baseline 2/28: 45   ? Time 8   ? Period Weeks   ? Status New   ? Target Date 10/19/21   ?  ? PT LONG TERM GOAL #3  ? Title Pt will decrease worst back pain as reported on NPRS by at least 3 points to 5/10 in order to demonstrate clinically significant reduction in back pain.   ? Baseline 2/28: 8/10   ? Time 8   ?  Period Weeks   ? Status New   ? Target Date 10/19/21   ?  ? PT LONG TERM GOAL #4  ? Title Patient will return to functional acitvity without muscle guarding or compensation to return to PLOF.   ? Baseline 2/28: frequently has to modify how to moves and bends to pick up her child   ? Time 8   ? Period Weeks   ? Status New   ? Target Date 10/19/21   ?  ? PT LONG TERM GOAL #5  ? Title Patient will increase BLE gross strength to 4+/5 as to improve functional strength for independent gait, increased standing tolerance and increased ADL ability.   ? Baseline 2/28: see note   ? Time 8   ? Period Weeks   ? Status New   ? Target Date 10/19/21   ? ?  ?  ? ?  ? ? ? ? ? ? ? ? Plan - 09/27/21 0836   ? ? Clinical Impression Statement Patient tolerates progressive core stabilization in combination with muscle tissue lengthening techniques. She has significant increase in tension due to manual labor over the weekend. Significant trigger points found in lumbar paraspinals and piriformis region. Pt will benefit from skilled PT services to address deficits and return to pain-free function at home and work.   ? Personal Factors and Comorbidities Age;Comorbidity 3+;Profession;Past/Current Experience;Time since onset of injury/illness/exacerbation   ? Comorbidities ADHD, asthma, fatigue, sleep disorder, anxiety, allergies, hypothyroidism   ? Examination-Activity Limitations Bed Mobility;Caring for Others;Bend;Carry;Lift;Sit;Squat;Stairs;Stand;Transfers   ? Examination-Participation Restrictions Cleaning;Driving;Laundry;Occupation;Shop;Volunteer;Yard Work;Other   ? Stability/Clinical  Decision Making Stable/Uncomplicated   ? Rehab Potential Fair   ? PT Frequency 2x / week   ? PT Duration 8 weeks   ? PT Treatment/Interventions ADLs/Self Care Home Management;Aquatic Therapy;Cryotherapy;Electrical S

## 2021-09-29 ENCOUNTER — Ambulatory Visit (INDEPENDENT_AMBULATORY_CARE_PROVIDER_SITE_OTHER): Payer: 59 | Admitting: Family Medicine

## 2021-09-29 ENCOUNTER — Encounter (INDEPENDENT_AMBULATORY_CARE_PROVIDER_SITE_OTHER): Payer: Self-pay | Admitting: Family Medicine

## 2021-09-29 ENCOUNTER — Other Ambulatory Visit: Payer: Self-pay

## 2021-09-29 VITALS — BP 113/76 | HR 67 | Temp 98.5°F | Ht 65.0 in | Wt 183.0 lb

## 2021-09-29 DIAGNOSIS — Z9189 Other specified personal risk factors, not elsewhere classified: Secondary | ICD-10-CM

## 2021-09-29 DIAGNOSIS — E669 Obesity, unspecified: Secondary | ICD-10-CM

## 2021-09-29 DIAGNOSIS — F4329 Adjustment disorder with other symptoms: Secondary | ICD-10-CM

## 2021-09-29 DIAGNOSIS — E8881 Metabolic syndrome: Secondary | ICD-10-CM | POA: Diagnosis not present

## 2021-09-29 DIAGNOSIS — Z6831 Body mass index (BMI) 31.0-31.9, adult: Secondary | ICD-10-CM

## 2021-09-29 MED ORDER — SEMAGLUTIDE-WEIGHT MANAGEMENT 0.5 MG/0.5ML ~~LOC~~ SOAJ
0.5000 mg | SUBCUTANEOUS | 0 refills | Status: DC
  Filled 2021-09-29: qty 2, 30d supply, fill #0
  Filled 2021-10-05: qty 2, 28d supply, fill #0

## 2021-09-30 ENCOUNTER — Ambulatory Visit: Payer: 59

## 2021-09-30 DIAGNOSIS — M533 Sacrococcygeal disorders, not elsewhere classified: Secondary | ICD-10-CM

## 2021-09-30 DIAGNOSIS — M6281 Muscle weakness (generalized): Secondary | ICD-10-CM | POA: Diagnosis not present

## 2021-09-30 DIAGNOSIS — M545 Low back pain, unspecified: Secondary | ICD-10-CM

## 2021-09-30 NOTE — Therapy (Signed)
[place] ?[place], [place], [address] ?Phone: [phone]   Fax:  [phone] ? ?Physical Therapy Treatment ? ?Patient Details  ?Name: Christine Adams ?MRN: [id number] ?Date of Birth: 1980-12-14 ?Referring Provider (PT): Dr Izora Ribas ? ? ?Encounter Date: 09/30/2021 ? ? PT End of Session - 09/30/21 0857   ? ? Visit Number 9   ? Number of Visits 16   ? Date for PT Re-Evaluation 10/19/21   ? Authorization Type 9/10 eval 2/28   ? PT Start Time 0845   ? PT Stop Time 0929   ? PT Time Calculation (min) 44 min   ? Activity Tolerance Patient tolerated treatment well   ? Behavior During Therapy [facility] for tasks assessed/performed   ? ?  ?  ? ?  ? ? ?Past Medical History:  ?Diagnosis Date  ? ADHD   ? Asthma   ? Back pain   ? BMI 30.0-30.9,adult   ? Hypothyroidism   ? Age 41 yrs  ? Other fatigue   ? Postpartum anxiety- ongoing   ? Postpartum depression   ? Rosacea   ? Shortness of breath on exertion   ? Vitamin D deficiency   ? ? ?Past Surgical History:  ?Procedure Laterality Date  ? WISDOM TOOTH EXTRACTION    ? ? ?There were no vitals filed for this visit. ? ? Subjective Assessment - 09/30/21 0855   ? ? Subjective Patient reports decreased pain and stiffness. Feels much better.   ? Pertinent History Patient is a 41 year old female who presents for lumbar radiculitis. 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 After her back went out in December 2022, had another spasm, went to Dr. Izora Ribas, had two rounds of steroids. Had and MRI which showed: Mild degenerative changes, with disc desiccation and mild disc bulges at L4-L5 and L5-S1.Pain is improving, waxes and wanes. Patient is very active, she rides horses   ? Limitations Sitting;Lifting;House hold activities;Other (comment)   ? How long can you sit comfortably? 20 minutes -30 minutes   ? How long can you stand comfortably? no limitations   ? How long can you walk comfortably? no limitations    ? Diagnostic tests :MRI:  on 12/19  1. Mild degenerative changes, with disc desiccation and mild disc  bulges at L4-L5 and L5-S1.  2. No spinal canal stenosis or neural foraminal narrowing.   ? Patient Stated Goals not hurt, move better, not limit life because of the back and pelvic pain.   ? Currently in Pain? No/denies   ? ?  ?  ? ?  ? ? ? ? ? ? ? ? ?Manual: ?Hamstring lengthening stretch 60 seconds each LE ; leg on PT shoulder ?Piriformis stretch 60 seconds each LE with PT overpressure ?single knee to chest 60 seconds with each LE.  ?Cross body knee to opposite shoulder 60 seconds each LE ? Adduction lengthening stretch 60 seconds  ?  ?Prone: ?Grade II mobilizations to lumbar and thoracic UPA and CPA x 6 minutes  ?  ?Palpation of LE's for assessment of trigger points ?  ?TherEx ?Supine:  ?TrA contraction with posterior pelvic tilt 15x 3 seconds ?TrA contraction with march 15x each LE ?TrA contraction with posterior pelvic tilt and adduction 12x 3 second holds ?Bridge 15x with adduction ball squeeze  ?Green swiss ball with TrA contraction between hands and knees 15x 3 second holds ?Green Stage manager with TrA contraction  and UE raises 12x each UE  ?Green swiss ball with TrA contraction with UE and contralateral LE heel slide 12x each LE  ? ?Quadruped bird dog 10x each reciprocal side  ?seated  ?-posterior anterior pelvic tilts 10x ?  ?-TrA activation pressing into swiss ball 10x 3 second holds  ?  ?Pt educated throughout session about proper posture and technique with exercises. Improved exercise technique, movement at target joints, use of target muscles after min to mod verbal, visual, tactile cues. ? ? ? ?Patient presents with excellent motivation throughout physical therapy session. Core activation tolerated well with challenges with coordination. Her muscle tissue length is improving with decreased stiffness reported. Pt will benefit from skilled PT services to address deficits and return to pain-free function  at home and work. ? ? ? ? ? ? ? ? ? ? ? ? ? ? ? ? PT Education - 09/30/21 0856   ? ? Education Details exercise technique, body mechanics   ? Person(s) Educated Patient   ? Methods Explanation;Demonstration;Tactile cues;Verbal cues   ? Comprehension Verbalized understanding;Returned demonstration;Verbal cues required;Tactile cues required   ? ?  ?  ? ?  ? ? ? PT Short Term Goals - 08/24/21 1500   ? ?  ? PT SHORT TERM GOAL #1  ? Title Patient will be independent in home exercise program to improve strength/mobility for better functional independence with ADLs.   ? Baseline 2/28: HEP given   ? Time 4   ? Period Weeks   ? Status New   ? Target Date 09/21/21   ? ?  ?  ? ?  ? ? ? ? PT Long Term Goals - 08/30/21 1355   ? ?  ? PT LONG TERM GOAL #1  ? Title Patient will increase FOTO score to equal to or greater than 40%    to demonstrate statistically significant improvement in mobility and quality of life.   ? Baseline 2/28: 10   ? Time 8   ? Period Weeks   ? Status New   ? Target Date 10/19/21   ?  ? PT LONG TERM GOAL #2  ? Title Pt will decrease mODI score by at least 13 points (32) in order demonstrate clinically significant reduction in back pain/disability.   ? Baseline 2/28: 45   ? Time 8   ? Period Weeks   ? Status New   ? Target Date 10/19/21   ?  ? PT LONG TERM GOAL #3  ? Title Pt will decrease worst back pain as reported on NPRS by at least 3 points to 5/10 in order to demonstrate clinically significant reduction in back pain.   ? Baseline 2/28: 8/10   ? Time 8   ? Period Weeks   ? Status New   ? Target Date 10/19/21   ?  ? PT LONG TERM GOAL #4  ? Title Patient will return to functional acitvity without muscle guarding or compensation to return to PLOF.   ? Baseline 2/28: frequently has to modify how to moves and bends to pick up her child   ? Time 8   ? Period Weeks   ? Status New   ? Target Date 10/19/21   ?  ? PT LONG TERM GOAL #5  ? Title Patient will increase BLE gross strength to 4+/5 as to improve  functional strength for independent gait, increased standing tolerance and increased ADL ability.   ? Baseline 2/28: see note   ? Time  8   ? Period Weeks   ? Status New   ? Target Date 10/19/21   ? ?  ?  ? ?  ? ? ? ? ? ? ? ? Plan - 09/30/21 0859   ? ? Clinical Impression Statement Patient presents with excellent motivation throughout physical therapy session. Core activation tolerated well with challenges with coordination. Her muscle tissue length is improving with decreased stiffness reported. Pt will benefit from skilled PT services to address deficits and return to pain-free function at home and work.   ? Personal Factors and Comorbidities Age;Comorbidity 3+;Profession;Past/Current Experience;Time since onset of injury/illness/exacerbation   ? Comorbidities ADHD, asthma, fatigue, sleep disorder, anxiety, allergies, hypothyroidism   ? Examination-Activity Limitations Bed Mobility;Caring for Others;Bend;Carry;Lift;Sit;Squat;Stairs;Stand;Transfers   ? Examination-Participation Restrictions Cleaning;Driving;Laundry;Occupation;Shop;Volunteer;Yard Work;Other   ? Stability/Clinical Decision Making Stable/Uncomplicated   ? Rehab Potential Fair   ? PT Frequency 2x / week   ? PT Duration 8 weeks   ? PT Treatment/Interventions ADLs/Self Care Home Management;Aquatic Therapy;Cryotherapy;Electrical Stimulation;Moist Heat;Traction;Ultrasound;DME Instruction;Canalith Repostioning;Gait Scientist, forensic;Therapeutic exercise;Therapeutic activities;Functional mobility training;Balance training;Neuromuscular re-education;Patient/family education;Manual techniques;Passive range of motion;Dry needling;Energy conservation;Splinting;Taping;Vestibular;Joint Manipulations;Spinal Manipulations;Visual/perceptual remediation/compensation   ? PT Next Visit Plan attempt distraction, core strengthening, mobilization   ? PT Home Exercise Plan see above   ? Consulted and Agree with Plan of Care Patient   ? ?  ?  ? ?  ? ? ?Patient will  benefit from skilled therapeutic intervention in order to improve the following deficits and impairments:  Abnormal gait, Decreased endurance, Decreased range of motion, Decreased mobility, Hypomobility, Difficu

## 2021-10-01 NOTE — Progress Notes (Signed)
? ? ? ?Chief Complaint:  ? ?OBESITY ?Christine Adams is here to discuss her progress with her obesity treatment plan along with follow-up of her obesity related diagnoses. Christine Adams is on the Category 2 Plan with breakfast and lunch options and states she is following her eating plan approximately 75% of the time. Christine Adams states she is horseback riding for 60 minutes 1 time per week. ? ?Today's visit was #: 4 ?Starting weight: 195 lbs ?Starting date: 08/04/2021 ?Today's weight: 188 lbs ?Today's date: 09/16/2021 ?Total lbs lost to date: 7 ?Total lbs lost since last in-office visit: 1 ? 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  She is disappointed in her 1 lb weight loss but not surprised considering. She has been skipping lunch in order to work and is not sleeping well.  Pt denies need for Dr Mallie Mussel referral for additional/ specific emotional eating counseling ? ? ?Subjective:  ? ?1. Acute stress reaction ?Christine Adams has been feeling down and "worthless" lately.  She is very stressed, but emotionally stable; no SI.  Still see's Dr. Darleene Cleaver for medication management with Zoloft & counseling/  cognitive behavioral therapy as well. ? ?2. Insulin resistance ?Christine Adams feels very little change from starting the 0.25 mg Wegovy at last OV.  Can't tell any difference with hunger or decreased cravings.  ? ? ?Assessment/Plan:  ? ?Medications Discontinued During This Encounter  ?Medication Reason  ? Semaglutide-Weight Management 0.25 MG/0.5ML SOAJ Dose increased today  ?  ? ?Meds ordered this encounter  ?Medications  ? INCREASE DOSE TO: Semaglutide-Weight Management 0.5 MG/0.5ML SOAJ  ?  Sig: Inject 0.5 mg into the skin once a week.  ?  Dispense:  2 mL  ?  Refill:  0  ?  ? ?1. Acute stress reaction ?Christine Adams will continue her medications and  treatment plan/counseling with her psychiatry team. She is to focus on self-care (not necessarily weight loss) over the next couple of weeks.  ? ?2. Insulin resistance ?Christine Adams agrees to increase dose to [facility] 0.5 mg weekly as she doesn't feel anything from the 0.25 mg weekly.  Continue prudent nutritional plan.  ? ?3. Obesity with current BMI of 31.3 ?Christine Adams is currently in the action stage of change. As such, her goal is to continue with weight loss efforts. She has agreed to the Category 2 Plan with breakfast and lunch options.  ? ?She is to focus on self-care only for the next couple of weeks. ?Exercise goals: Start walking or doing yoga every day  at lunch to help with stress. No skipping meals  ? ?Behavioral modification strategies: increasing lean protein intake, increasing high fiber foods, no skipping meals, and planning for success. ? ?Christine Adams has agreed to follow-up with our clinic in 2 weeks. She was informed of the importance of frequent follow-up visits to maximize her success with intensive lifestyle modifications for her multiple health conditions.  ? ? ?Objective:  ? ?Blood pressure 123/84, pulse 74, temperature 98.4 ?F (36.9 ?C), height '5\' 5"'$  (1.651 m), weight 188 lb (85.3 kg), SpO2 97 %, currently breastfeeding. ?Body mass index is 31.28 kg/m?. ? ?General: Cooperative, alert, well developed, in no acute distress. ?HEENT: Conjunctivae and lids unremarkable. ?Cardiovascular: Regular rhythm.  ?Lungs: Normal work of breathing. ?Neurologic: No focal deficits.  ? ?Lab Results  ?Component Value Date  ? CREATININE  0.70 08/04/2021  ? BUN 13 08/04/2021  ? NA 140 08/04/2021  ? K 4.3 08/04/2021  ? CL 103 08/04/2021  ? CO2 20 08/04/2021  ? ?Lab Results  ?Component Value Date  ? ALT 24 08/04/2021  ? AST 22 08/04/2021  ? ALKPHOS 92 08/04/2021  ? BILITOT 0.5 08/04/2021  ? ?Lab Results  ?Component Value Date  ? HGBA1C 5.1 08/04/2021  ? HGBA1C 5.2 12/06/2016  ? ?Lab Results  ?Component Value Date  ? INSULIN 13.3  08/04/2021  ? ?Lab Results  ?Component Value Date  ? TSH 2.240 08/04/2021  ? ?Lab Results  ?Component Value Date  ? CHOL 164 08/04/2021  ? HDL 32 (L) 08/04/2021  ? [place] 94 08/04/2021  ? LDLDIRECT 105.0 02/14/2020  ? TRIG 225 (H) 08/04/2021  ? CHOLHDL 5 02/14/2020  ? ?Lab Results  ?Component Value Date  ? VD25OH 57.5 08/04/2021  ? VD25OH 51.25 12/09/2020  ? ?Lab Results  ?Component Value Date  ? WBC 6.6 08/04/2021  ? HGB 15.7 08/04/2021  ? HCT 47.1 (H) 08/04/2021  ? MCV 88 08/04/2021  ? PLT 283 08/04/2021  ? ?Lab Results  ?Component Value Date  ? IRON 87 12/09/2020  ? FERRITIN 54.9 12/09/2020  ? ?Attestation Statements:  ? ?Reviewed by clinician on day of visit: allergies, medications, problem list, medical history, surgical history, family history, social history, and previous encounter notes. ? ?Time spent on visit including pre-visit chart review and post-visit care and charting was 40 minutes.  ? ?I, Trixie Dredge, am acting as transcriptionist for [employer], DO. ? ?I have reviewed the above documentation for accuracy and completeness, and I agree with the above. Marjory Sneddon, D.O. ? ?The [place] was signed into law in 2016 which includes the topic of electronic health records.  This provides immediate access to information in MyChart.  This includes consultation notes, operative notes, office notes, lab results and pathology reports.  If you have any questions about what you read please let us know at your next visit so we can discuss your concerns and take corrective action if need be.  We are right here with you. ? ? ? ?

## 2021-10-02 DIAGNOSIS — E88819 Insulin resistance, unspecified: Secondary | ICD-10-CM | POA: Insufficient documentation

## 2021-10-02 DIAGNOSIS — F4329 Adjustment disorder with other symptoms: Secondary | ICD-10-CM | POA: Insufficient documentation

## 2021-10-02 DIAGNOSIS — E8881 Metabolic syndrome: Secondary | ICD-10-CM | POA: Insufficient documentation

## 2021-10-02 HISTORY — DX: Insulin resistance, unspecified: E88.819

## 2021-10-02 HISTORY — DX: Adjustment disorder with other symptoms: F43.29

## 2021-10-04 ENCOUNTER — Ambulatory Visit: Payer: 59

## 2021-10-04 NOTE — Progress Notes (Signed)
? ? ? ?Chief Complaint:  ? ?OBESITY ?Christine Adams is here to discuss her progress with her obesity treatment plan along with follow-up of her obesity related diagnoses. Christine Adams is on the Category 2 Plan with breakfast and lunch options and states she is following her eating plan approximately 50% of the time. Christine Adams states she is not currently exercising. ? ?Today's visit was #: 5 ?Starting weight: 195 lbs ?Starting date: 08/04/2021 ?Today's weight: 183 lbs ?Today's date: 09/29/2021 ?Total lbs lost to date: 12 ?Total lbs lost since last in-office visit: 5 ? ?Interim History: Christine Adams notes a lot of stress with work, but doing great with handling it all. She takes 30 minutes at lunch for herself and trying to use CALM app to meditate sometimes. Meal plan-doing better with trying to eat all of her food on the plan.  ? ?Subjective:  ? ?1. Insulin resistance ?Christine Adams's last office visit, we increased dose from 0.25 mg to 0.5 mg weekly. Helped her stay away from sweets and she did less snacking at work on her higher dose of Wegovy.  ? ?2. Stress and adjustment reaction ?Christine Adams is back on her Zoloft regularly. She hasn't skipped any doses and she is feeling better. She is doing yoga at lunch and doing better with self-care activities.  ? ?3. At risk for impaired metabolic function ?Christine Adams is at increased risk for impaired metabolic function due to insulin resistance and hypothyroidism.  ? ?Assessment/Plan:  ?No orders of the defined types were placed in this encounter. ? ? ?Medications Discontinued During This Encounter  ?Medication Reason  ? Semaglutide-Weight Management 0.5 MG/0.5ML SOAJ Reorder  ?  ? ?Meds ordered this encounter  ?Medications  ? Semaglutide-Weight Management 0.5 MG/0.5ML SOAJ  ?  Sig: Inject 0.5 mg into the skin once a week.  ?  Dispense:  2 mL  ?  Refill:  0  ?  ? ?1. Insulin resistance ?We will refill Wegovy 0.5 mg, no need for change in dose at this time. Christine Adams will continue her prudent nutritional plan and  decrease simple carbs. Risks and benefits of medications were discussed with the patient again. She is to increase her water intake and continue activity to prevent constipation. ? ?- Semaglutide-Weight Management 0.5 MG/0.5ML SOAJ; Inject 0.5 mg into the skin once a week.  Dispense: 2 mL; Refill: 0 ? ?2. Stress and adjustment reaction ?Christine Adams will continue her medications per her psych provider Dr. Darleene Adams with counseling. She will continue meditation and exercise (yoga) daily. ? ?3. At risk for impaired metabolic function ?Christine Adams was given approximately 9 minutes of impaired  metabolic function prevention counseling today. We discussed intensive lifestyle modifications today with an emphasis on specific nutrition and exercise instructions and strategies.  ? ?Repetitive spaced learning was employed today to elicit superior memory formation and behavioral change. ? ?4. Obesity with current BMI of 31.3 ?Christine Adams is currently in the action stage of change. As such, her goal is to continue with weight loss efforts. She has agreed to the Category 2 Plan with breakfast and lunch options.  ? ?We discussed various medication options to help Christine Adams with her weight loss efforts and we both agreed to continue Wegovy, and we will refill for 1 month with no change in dose. ? ?- Semaglutide-Weight Management 0.5 MG/0.5ML SOAJ; Inject 0.5 mg into the skin once a week.  Dispense: 2 mL; Refill: 0 ? ?Exercise goals: As is. Yoga daily and meditation.  ? ?Behavioral modification strategies: increasing lean protein intake, increasing water intake,  increasing high fiber foods, no skipping meals, and avoiding temptations. ? ?Christine Adams has agreed to follow-up with our clinic in 2 to 3 weeks. She was informed of the importance of frequent follow-up visits to maximize her success with intensive lifestyle modifications for her multiple health conditions.  ? ?Objective:  ? ?Blood pressure 113/76, pulse 67, temperature 98.5 ?F (36.9 ?C), height 5'  5" (1.651 m), weight 183 lb (83 kg), SpO2 96 %, currently breastfeeding. ?Body mass index is 30.45 kg/m?. ? ?General: Cooperative, alert, well developed, in no acute distress. ?HEENT: Conjunctivae and lids unremarkable. ?Cardiovascular: Regular rhythm.  ?Lungs: Normal work of breathing. ?Neurologic: No focal deficits.  ? ?Lab Results  ?Component Value Date  ? CREATININE 0.70 08/04/2021  ? BUN 13 08/04/2021  ? NA 140 08/04/2021  ? K 4.3 08/04/2021  ? CL 103 08/04/2021  ? CO2 20 08/04/2021  ? ?Lab Results  ?Component Value Date  ? ALT 24 08/04/2021  ? AST 22 08/04/2021  ? ALKPHOS 92 08/04/2021  ? BILITOT 0.5 08/04/2021  ? ?Lab Results  ?Component Value Date  ? HGBA1C 5.1 08/04/2021  ? HGBA1C 5.2 12/06/2016  ? ?Lab Results  ?Component Value Date  ? INSULIN 13.3 08/04/2021  ? ?Lab Results  ?Component Value Date  ? TSH 2.240 08/04/2021  ? ?Lab Results  ?Component Value Date  ? CHOL 164 08/04/2021  ? HDL 32 (L) 08/04/2021  ? [place] 94 08/04/2021  ? LDLDIRECT 105.0 02/14/2020  ? TRIG 225 (H) 08/04/2021  ? CHOLHDL 5 02/14/2020  ? ?Lab Results  ?Component Value Date  ? VD25OH 57.5 08/04/2021  ? VD25OH 51.25 12/09/2020  ? ?Lab Results  ?Component Value Date  ? WBC 6.6 08/04/2021  ? HGB 15.7 08/04/2021  ? HCT 47.1 (H) 08/04/2021  ? MCV 88 08/04/2021  ? PLT 283 08/04/2021  ? ?Lab Results  ?Component Value Date  ? IRON 87 12/09/2020  ? FERRITIN 54.9 12/09/2020  ? ?Attestation Statements:  ? ?Reviewed by clinician on day of visit: allergies, medications, problem list, medical history, surgical history, family history, social history, and previous encounter notes. ? ? ?I, Christine Adams, am acting as transcriptionist for [employer], DO. ? ?I have reviewed the above documentation for accuracy and completeness, and I agree with the above. Christine Adams, D.O. ? ?The [place] was signed into law in 2016 which includes the topic of electronic health records.  This provides immediate access to information in  MyChart.  This includes consultation notes, operative notes, office notes, lab results and pathology reports.  If you have any questions about what you read please let us know at your next visit so we can discuss your concerns and take corrective action if need be.  We are right here with you. ? ? ?

## 2021-10-05 ENCOUNTER — Other Ambulatory Visit: Payer: Self-pay

## 2021-10-07 ENCOUNTER — Ambulatory Visit: Payer: 59 | Admitting: Physical Therapy

## 2021-10-07 DIAGNOSIS — M6281 Muscle weakness (generalized): Secondary | ICD-10-CM | POA: Diagnosis not present

## 2021-10-07 DIAGNOSIS — M533 Sacrococcygeal disorders, not elsewhere classified: Secondary | ICD-10-CM | POA: Diagnosis not present

## 2021-10-07 DIAGNOSIS — M545 Low back pain, unspecified: Secondary | ICD-10-CM | POA: Diagnosis not present

## 2021-10-07 NOTE — Therapy (Signed)
[facility] ?[place], [place], [address] ?Phone: [phone]   Fax:  [phone] ? ?Physical Therapy Treatment /Progress Note from 08/24/21 to 10/07/21 ( 10 visits)  ? ?Patient Details  ?Name: Christine Adams ?MRN: [id number] ?Date of Birth: 19-Aug-1980 ?Referring Provider (PT): Dr Izora Ribas ? ? ?Encounter Date: 10/07/2021 ? ? PT End of Session - 10/07/21 0807   ? ? Visit Number 10   ? Number of Visits 16   ? Date for PT Re-Evaluation 10/19/21   ? Authorization Type eval 2/28, PN #10 note 10/07/21,   ? PT Start Time [phone]   ? PT Stop Time 0900   ? PT Time Calculation (min) 56 min   ? Activity Tolerance Patient tolerated treatment well   ? Behavior During Therapy [facility] for tasks assessed/performed   ? ?  ?  ? ?  ? ? ?Past Medical History:  ?Diagnosis Date  ? ADHD   ? Asthma   ? Back pain   ? BMI 30.0-30.9,adult   ? Hypothyroidism   ? Age 41 yrs  ? Other fatigue   ? Postpartum anxiety- ongoing   ? Postpartum depression   ? Rosacea   ? Shortness of breath on exertion   ? Vitamin D deficiency   ? ? ?Past Surgical History:  ?Procedure Laterality Date  ? WISDOM TOOTH EXTRACTION    ? ? ?There were no vitals filed for this visit. ? ? Subjective Assessment - 10/07/21 0900   ? ? Subjective Pt reports she had no LBP since January, and she feels stronger and less tight. Pt has not had a chance to go horse back riding at the frequency she used. When she does ride, it felt good without LBP. Pt is aware of her body mechanics with lifting. Cleaning house and picking up items from the floor and picking up sticks used to bother but now she is using better body mechanics and she has not had a flare up with back.   ? ?  ?  ? ?  ? ? ? ? ? [facility] PT Assessment - 10/07/21 0816   ? ?  ? Observation/Other Assessments  ? Observations poor feet awareness/ propioception withdefault to supination/ INV / adduction of hips in execises and poor feet alignment in squats   ?  ? Coordination  ?  Coordination and Movement Description ab glut / adductor overuse with posterior pelvic tilt, ab overuse with core exercises and no activation of pelvic floor,   ?  ? AROM  ? Overall AROM Comments 10/07/21 : figure-4: L 66 cm, R 61.5 cm   ? ?  ?  ? ?  ? ? ? ? ? ? ? ? ? ? ? ? ? ? ? ? [facility] Adult PT Treatment/Exercise - 10/07/21 1049   ? ?  ? Therapeutic Activites   ? Other Therapeutic Activities modified current core exercises to yield greater outcomes for pelvic floor issues and long term outcome with LBP   ?  ? Neuro Re-ed   ? Neuro Re-ed Details  cued for less ab  overuse in HEP, cued for awarenss of stability points of contact with other parts of body, cued for ankle/ feet alignment  in squats   ?  ? Manual Therapy  ? Manual therapy comments through clothing, STM/MWM to minimize tightness of anterior pelvic floor to promote mobility   ? ?  ?  ? ?  ? ? ? ? ? ? ? ? ? ? ? ?  PT Short Term Goals - 10/07/21 1457   ? ?  ? PT SHORT TERM GOAL #1  ? Title Patient will be independent in home exercise program to improve strength/mobility for better functional independence with ADLs.   ? Baseline 2/28: HEP given   ? Time 4   ? Period Weeks   ? Status Achieved   ? Target Date 09/21/21   ? ?  ?  ? ?  ? ? ? ? PT Long Term Goals - 10/07/21 0921   ? ?  ? PT LONG TERM GOAL #1  ? Title Patient will increase FOTO score to equal to or greater than 40%    to demonstrate statistically significant improvement in mobility and quality of life.   ? Baseline 2/28: 10   ? Time 8   ? Period Weeks   ? Status On-going   ? Target Date 10/19/21   ?  ? PT LONG TERM GOAL #2  ? Title Pt will decrease mODI score by at least 13 points (32) in order demonstrate clinically significant reduction in back pain/disability.   ? Baseline 2/28: 45   ? Time 8   ? Period Weeks   ? Status On-going   ? Target Date 10/19/21   ?  ? PT LONG TERM GOAL #3  ? Title Pt will decrease worst back pain as reported on NPRS by at least 3 points to 5/10 in order to demonstrate  clinically significant reduction in back pain.   ? Baseline 2/28: 8/10   ? Time 8   ? Period Weeks   ? Status On-going   ? Target Date 10/19/21   ?  ? PT LONG TERM GOAL #4  ? Title Patient will return to functional acitvity without muscle guarding or compensation to return to PLOF.   ? Baseline 2/28: frequently has to modify how to moves and bends to pick up her child  ( 4/13: improved body mechanics awareness and practice)   ? Time 8   ? Period Weeks   ? Status Achieved   ? Target Date 10/19/21   ?  ? PT LONG TERM GOAL #5  ? Title Patient will increase BLE gross strength to 4+/5 as to improve functional strength for independent gait, increased standing tolerance and increased ADL ability.   ? Baseline 2/28: see note   ? Time 8   ? Period Weeks   ? Status On-going   ? Target Date 10/19/21   ?  ? Additional Long Term Goals  ? Additional Long Term Goals Yes   ?  ? PT LONG TERM GOAL #6  ? Title Pt will demo proper deep core coordination and co-activationo feet to promote pelvic gridel stability for long periods of horseback ing with less pain and to minimize relapse of tight adductor/glut mm and supination/ INV of feet for balance, to promote better mechanics for squats for lifting child and picking up sticks in yard without relapse of back pain.   ? Baseline dyscoordination of depe core system ( overuse of oblique mm , gluts, adductors) and poor co-activation of feet in standing and other positions with supination/ INV   ? Time 10   ? Period Weeks   ? Status New   ? Target Date 12/16/21   ? ?  ?  ? ?  ? ? ? ? ? ? ? ? Plan - 10/07/21 0807   ? ? Clinical Impression Statement Pt has achieved   goals and progressing well  towards remaining goals. Diastasis recti has resolved and SIJ mobility has been restored.  Pt 's deep core and global core mm are stronger as pt has been able to perform ADLs without LBP / flare up. Pt is aware of her body mechanics with lifting grandson, cleaning house, and picking up items from the  floor and picking up sticks used to bother her which does not bother her anymore.  Today, modified current core exercises to yield greater outcomes for pelvic floor issues and long term outcome with LBP. Pt required excessive cues for less overuse of abdominal mm and for feet propioception in squats and other HEP to minimize overactivity of pelvic floor and lower kinetic chain mm tensions. Pt has not returned to horseback riding at the level of frequency at [facility] but anticipate pt will be able to return with less LBP with the improvements she is making currently through regional interdependent approach which is addressing lower kinetic chain deficits in addition to back and spine. Pt benefits from skilled PT.   ? Personal Factors and Comorbidities Age;Comorbidity 3+;Profession;Past/Current Experience;Time since onset of injury/illness/exacerbation   ? Comorbidities ADHD, asthma, fatigue, sleep disorder, anxiety, allergies, hypothyroidism   ? Examination-Activity Limitations Bed Mobility;Caring for Others;Bend;Carry;Lift;Sit;Squat;Stairs;Stand;Transfers   ? Examination-Participation Restrictions Cleaning;Driving;Laundry;Occupation;Shop;Volunteer;Yard Work;Other   ? Stability/Clinical Decision Making Stable/Uncomplicated   ? Rehab Potential Fair   ? PT Frequency 2x / week   ? PT Duration 8 weeks   ? PT Treatment/Interventions ADLs/Self Care Home Management;Aquatic Therapy;Cryotherapy;Electrical Stimulation;Moist Heat;Traction;Ultrasound;DME Instruction;Canalith Repostioning;Gait Scientist, forensic;Therapeutic exercise;Therapeutic activities;Functional mobility training;Balance training;Neuromuscular re-education;Patient/family education;Manual techniques;Passive range of motion;Dry needling;Energy conservation;Splinting;Taping;Vestibular;Joint Manipulations;Spinal Manipulations;Visual/perceptual remediation/compensation   ? PT Next Visit Plan attempt distraction, core strengthening, mobilization   ? PT Home  Exercise Plan see above   ? Consulted and Agree with Plan of Care Patient   ? ?  ?  ? ?  ? ? ?Patient will benefit from skilled therapeutic intervention in order to improve the following deficits and impairments:

## 2021-10-07 NOTE — Patient Instructions (Addendum)
Pelvic tilts, to avoid overtightening gluts and adductors and obliques  ? ?Not rock back so far, not tightening adductors ?More ballmounds contact  ?Thumb at ribs, index finger at ASIS to make sure to not over do  ?Exhale on backward rock ?Inhale tailbone into table ? ? ?____ ? ? ?Remove the head lift exercise  ? ? ?With leg slides,  ?Anchor points through back body and opp foot ( placed hip width apart) ?In hale soft sound, exhale , slide foot, toes up,  ?Inhale, exhale slide, back, ballmounds contact  ?Foot placement hip widith apart  ? ?__ ? ?Stretch for pelvic floor  ? ?V- slides  ?Scoot hip to the R, ( to alleviate anterior thigh tightness)  ?"v heels slide R feet away and then point toes out and then slide foot back toward buttocks  ?10 reps  ? ?Other side, 10 reps  ? ? ?Mermaid stretch  ?Rocking while seated on the floor with heels to one side of the hip ? ?Elevate R buttock with folded towel  ( to alleviate L hip tightness when L heel is back)  ?Heels to one side of the L  hip  ?Rock forward towards the knee that is bent , rock beck towards the opposite sitting bones ? ? ?Hamstring / adductors seated V stretch ?Seated with one knee straight, heel down, toes up, turned knee and toes slightly out, hinge at hips while maintaining head above heart ( not forward head)  ?5 breaths  ? ? ?- ? ? ?Avoid straining pelvic floor, abdominal muscles , spine  ?Use log rolling technique instead of getting out of bed with your neck or the sit-up  ? ? ? ?Log rolling into and out of bed ? ? ?Log rolling into and out of bed ?If getting out of bed on R side, ?Bent knees, scoot hips/ shoulder to L  ?Raise R arm completely overhead, rolling onto armpit  ?Then lower bent knees to bed to get into complete side lying position  ?Then drop legs off bed, and push up onto R elbow/forearm, and use L hand to push onto the bed ? ? ?

## 2021-10-11 ENCOUNTER — Other Ambulatory Visit: Payer: Self-pay

## 2021-10-12 ENCOUNTER — Other Ambulatory Visit: Payer: Self-pay

## 2021-10-12 ENCOUNTER — Ambulatory Visit: Payer: 59 | Admitting: Physical Therapy

## 2021-10-12 DIAGNOSIS — M533 Sacrococcygeal disorders, not elsewhere classified: Secondary | ICD-10-CM | POA: Diagnosis not present

## 2021-10-12 DIAGNOSIS — M6281 Muscle weakness (generalized): Secondary | ICD-10-CM | POA: Diagnosis not present

## 2021-10-12 DIAGNOSIS — M545 Low back pain, unspecified: Secondary | ICD-10-CM | POA: Diagnosis not present

## 2021-10-12 NOTE — Patient Instructions (Signed)
Deep core level 1-2 * handout) ? 2 x day  ?

## 2021-10-12 NOTE — Therapy (Signed)
[facility] ?[facility] ?[place], [place], [address] ?Phone: [phone]   Fax:  [phone] ? ?Physical Therapy Treatment ? ?Patient Details  ?Name: Christine Adams ?MRN: [id number] ?Date of Birth: 1981-03-07 ?Referring Provider (PT): Dr Izora Ribas ? ? ?Encounter Date: 10/12/2021 ? ? PT End of Session - 10/12/21 0815   ? ? Visit Number 11   ? Number of Visits 16   ? Date for PT Re-Evaluation 10/19/21   ? Authorization Type eval 2/28, PN #10 note 10/07/21,   ? PT Start Time [phone]   ? PT Stop Time [phone]   ? PT Time Calculation (min) 55 min   ? Activity Tolerance Patient tolerated treatment well   ? Behavior During Therapy [facility] for tasks assessed/performed   ? ?  ?  ? ?  ? ? ?Past Medical History:  ?Diagnosis Date  ? ADHD   ? Asthma   ? Back pain   ? BMI 30.0-30.9,adult   ? Hypothyroidism   ? Age 41 yrs  ? Other fatigue   ? Postpartum anxiety- ongoing   ? Postpartum depression   ? Rosacea   ? Shortness of breath on exertion   ? Vitamin D deficiency   ? ? ?Past Surgical History:  ?Procedure Laterality Date  ? WISDOM TOOTH EXTRACTION    ? ? ?There were no vitals filed for this visit. ? ? Subjective Assessment - 10/12/21 0811   ? ? Subjective Pt reports she laughed and sneezed and had leakage. Pt still has no LBP for the past 4 months.   ? Pertinent History Patient is a 41 year old female who presents for lumbar radiculitis. 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 After her back went out in December 2022, had another spasm, went to Dr. Izora Ribas, had two rounds of steroids. Had and MRI which showed: Mild degenerative changes, with disc desiccation and mild disc bulges at L4-L5 and L5-S1.Pain is improving, waxes and wanes. Patient is very active, she rides horses   ? ?  ?  ? ?  ? ? ? ? ? [facility] PT Assessment - 10/12/21 0853   ? ?  ? Coordination  ? Coordination and Movement Description pelvic perturbation, posterior tilt of pelvis   ?  ? PROM  ? Overall  PROM Comments FADDIR with no more pain at groin and with SIJ AROM   ? ?  ?  ? ?  ? ? ? ? ? ? ? ? ? ? ? ? ? Pelvic Floor Special Questions - 10/12/21 0852   ? ? External Perineal Exam through clothing, tightness at anterior pelvic floor , transverse perineal mm B,   ? ?  ?  ? ?  ? ? ? ? [facility] Adult PT Treatment/Exercise - 10/12/21 0850   ? ?  ? Therapeutic Activites   ? Other Therapeutic Activities administered FOTO and mODI , explained the physiology and function of deep core system   ?  ? Neuro Re-ed   ? Neuro Re-ed Details  cued for pelvic floor stretches from last session,  deep core coordination, cued for relaxation   ?  ? Manual Therapy  ? Manual therapy comments through clothing, STM/MWM to minimize tightness of anterior pelvic floor to promote mobility   ? ?  ?  ? ?  ? ? ? ? ? ? ? ? ? ? ? ? PT Short Term Goals - 10/07/21 1457   ? ?  ?  PT SHORT TERM GOAL #1  ? Title Patient will be independent in home exercise program to improve strength/mobility for better functional independence with ADLs.   ? Baseline 2/28: HEP given   ? Time 4   ? Period Weeks   ? Status Achieved   ? Target Date 09/21/21   ? ?  ?  ? ?  ? ? ? ? PT Long Term Goals - 10/12/21 0822   ? ?  ? PT LONG TERM GOAL #1  ? Title Patient will increase FOTO score to equal to or greater than 40%    to demonstrate statistically significant improvement in mobility and quality of life.   ? Baseline 2/28: 10 , 4/18:  10 pts to  73 pts   ? Time 8   ? Period Weeks   ? Status Achieved   ? Target Date 10/19/21   ?  ? PT LONG TERM GOAL #2  ? Title Pt will decrease mODI score by at least 13 points (32) in order demonstrate clinically significant reduction in back pain/disability.   ? Baseline 2/28: 45/50   , 10/12/21:  4 /50 pts   ? Time 8   ? Period Weeks   ? Status Achieved   ? Target Date 10/19/21   ?  ? PT LONG TERM GOAL #3  ? Title Pt will decrease worst back pain as reported on NPRS by at least 3 points to 5/10 in order to demonstrate clinically significant  reduction in back pain.   ? Baseline 2/28: 8/10 ,   10/12/21: no pain across 4 months   ? Time 8   ? Period Weeks   ? Status Achieved   ? Target Date 10/19/21   ?  ? PT LONG TERM GOAL #4  ? Title Patient will return to functional acitvity without muscle guarding or compensation to return to PLOF.   ? Baseline 2/28: frequently has to modify how to moves and bends to pick up her child  ( 4/13: improved body mechanics awareness and practice)   ? Time 8   ? Period Weeks   ? Status Achieved   ? Target Date 10/19/21   ?  ? PT LONG TERM GOAL #5  ? Title Patient will increase BLE gross strength to 4+/5 as to improve functional strength for independent gait, increased standing tolerance and increased ADL ability.   ? Baseline 2/28: see note   ? Time 8   ? Period Weeks   ? Status On-going   ? Target Date 10/19/21   ?  ? PT LONG TERM GOAL #6  ? Title Pt will demo proper deep core coordination and co-activationo feet to promote pelvic gridel stability for long periods of horseback ing with less pain and to minimize relapse of tight adductor/glut mm and supination/ INV of feet for balance, to promote better mechanics for squats for lifting child and picking up sticks in yard without relapse of back pain.   ? Baseline dyscoordination of depe core system ( overuse of oblique mm , gluts, adductors) and poor co-activation of feet in standing and other positions with supination/ INV   ? Time 10   ? Period Weeks   ? Status On-going   ? Target Date 12/16/21   ? ?  ?  ? ?  ? ? ? ? ? ? ? ? Plan - 10/12/21 0858   ? ? Clinical Impression Statement Pt 's FOTO and mODIU scores improved  significantly which reflects pt's  report of having no more back pain across the past 4 months. Pt showed significantly improved SIJ mobility but needed external manual Tx to decrease pelvic floor tightness. Pt required cues for pelvic propioception and deep core coordination exercises.  Pt continues to benefit form skilled PT   ? Personal Factors and  Comorbidities Age;Comorbidity 3+;Profession;Past/Current Experience;Time since onset of injury/illness/exacerbation   ? Comorbidities ADHD, asthma, fatigue, sleep disorder, anxiety, allergies, hypothyroidism   ? Examination-Activity Limitations Bed Mobility;Caring for Others;Bend;Carry;Lift;Sit;Squat;Stairs;Stand;Transfers   ? Examination-Participation Restrictions Cleaning;Driving;Laundry;Occupation;Shop;Volunteer;Yard Work;Other   ? Stability/Clinical Decision Making Stable/Uncomplicated   ? Rehab Potential Fair   ? PT Frequency 2x / week   ? PT Duration 8 weeks   ? PT Treatment/Interventions ADLs/Self Care Home Management;Aquatic Therapy;Cryotherapy;Electrical Stimulation;Moist Heat;Traction;Ultrasound;DME Instruction;Canalith Repostioning;Gait Scientist, forensic;Therapeutic exercise;Therapeutic activities;Functional mobility training;Balance training;Neuromuscular re-education;Patient/family education;Manual techniques;Passive range of motion;Dry needling;Energy conservation;Splinting;Taping;Vestibular;Joint Manipulations;Spinal Manipulations;Visual/perceptual remediation/compensation   ? PT Next Visit Plan attempt distraction, core strengthening, mobilization   ? PT Home Exercise Plan see above   ? Consulted and Agree with Plan of Care Patient   ? ?  ?  ? ?  ? ? ?Patient will benefit from skilled therapeutic intervention in order to improve the following deficits and impairments:  Abnormal gait, Decreased endurance, Decreased range of motion, Decreased mobility, Hypomobility, Difficulty walking, Impaired flexibility, Postural dysfunction, Improper body mechanics, Pain ? ?Visit Diagnosis: ?Acute bilateral low back pain, unspecified whether sciatica present ? ?Sacrococcygeal disorders, not elsewhere classified ? ?Muscle weakness (generalized) ? ? ? ? ?Problem List ?Patient Active Problem List  ? Diagnosis Date Noted  ? Stress and adjustment reaction 10/02/2021  ? Insulin resistance 10/02/2021  ? Chronic low  back pain 06/01/2021  ? Fatigue 12/09/2020  ? BMI 30.0-30.9,adult   ? Encounter for induction of labor 10/11/2019  ? Rosacea 12/10/2018  ? Asthma 11/27/2017  ? Preventative health care 12/08/2016  ? Hypothyroidism

## 2021-10-13 ENCOUNTER — Ambulatory Visit (INDEPENDENT_AMBULATORY_CARE_PROVIDER_SITE_OTHER): Payer: 59 | Admitting: Family Medicine

## 2021-10-13 ENCOUNTER — Encounter (INDEPENDENT_AMBULATORY_CARE_PROVIDER_SITE_OTHER): Payer: Self-pay | Admitting: Family Medicine

## 2021-10-13 VITALS — BP 108/71 | HR 61 | Temp 97.5°F | Ht 65.0 in | Wt 180.0 lb

## 2021-10-13 DIAGNOSIS — F4329 Adjustment disorder with other symptoms: Secondary | ICD-10-CM

## 2021-10-13 DIAGNOSIS — Z6830 Body mass index (BMI) 30.0-30.9, adult: Secondary | ICD-10-CM | POA: Diagnosis not present

## 2021-10-13 DIAGNOSIS — E669 Obesity, unspecified: Secondary | ICD-10-CM | POA: Diagnosis not present

## 2021-10-13 DIAGNOSIS — E8881 Metabolic syndrome: Secondary | ICD-10-CM | POA: Diagnosis not present

## 2021-10-14 ENCOUNTER — Ambulatory Visit: Payer: 59

## 2021-10-14 DIAGNOSIS — M6281 Muscle weakness (generalized): Secondary | ICD-10-CM

## 2021-10-14 DIAGNOSIS — M545 Low back pain, unspecified: Secondary | ICD-10-CM | POA: Diagnosis not present

## 2021-10-14 DIAGNOSIS — M533 Sacrococcygeal disorders, not elsewhere classified: Secondary | ICD-10-CM

## 2021-10-14 NOTE — Therapy (Signed)
[facility] ?[facility] ?[address] ?[place], [place], [address] ?Phone: [phone]   Fax:  [phone] ? ?Physical Therapy Treatment/[facility] ? ?Patient Details  ?Name: Christine Adams ?MRN: [id number] ?Date of Birth: 03/23/1981 ?Referring Provider (PT): Dr Yarbrough ? ? ?Encounter Date: 10/14/2021 ? ? PT End of Session - 10/14/21 0845   ? ? Visit Number 12   ? Number of Visits 28   ? Date for PT Re-Evaluation 12/09/21   ? Authorization Type eval 2/28, PN #10 note 10/07/21,   ? PT Start Time 0808   ? PT Stop Time 0850   ? PT Time Calculation (min) 42 min   ? Activity Tolerance Patient tolerated treatment well   ? Behavior During Therapy WFL for tasks assessed/performed   ? ?  ?  ? ?  ? ? ?Past Medical History:  ?Diagnosis Date  ? ADHD   ? Asthma   ? Back pain   ? BMI 30.0-30.9,adult   ? Hypothyroidism   ? Age 41 yrs  ? Other fatigue   ? Postpartum anxiety- ongoing   ? Postpartum depression   ? Rosacea   ? Shortness of breath on exertion   ? Vitamin D deficiency   ? ? ?Past Surgical History:  ?Procedure Laterality Date  ? WISDOM TOOTH EXTRACTION    ? ? ?There were no vitals filed for this visit. ? ? Subjective Assessment - 10/14/21 0848   ? ? Subjective Patient reports her back issue is mostly resolved but continues to have incontinance issues and intercourse pain.   ? Pertinent History Patient is a 41 year old female who presents for lumbar radiculitis. 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 After her back went out in December 2022, had another spasm, went to Dr. Yarbrough, had two rounds of steroids. Had and MRI which showed: Mild degenerative changes, with disc desiccation and mild disc bulges at L4-L5 and L5-S1.Pain is improving, waxes and wanes. Patient is very active, she rides horses   ? Limitations Sitting;Lifting;House hold activities;Other (comment)   ? How long can you sit comfortably? 20 minutes -30 minutes   ? How long can you stand  comfortably? no limitations   ? How long can you walk comfortably? no limitations   ? Diagnostic tests :MRI:  on 12/19  1. Mild degenerative changes, with disc desiccation and mild disc  bulges at L4-L5 and L5-S1.  2. No spinal canal stenosis or neural foraminal narrowing.   ? Patient Stated Goals not hurt, move better, not limit life because of the back and pelvic pain.   ? Currently in Pain? No/denies   ? ?  ?  ? ?  ? ?Goals:  ?BLE strength: L 4+/5; R 4+/5  ? ? ? ?new goal:  ?Pelvic floor issues and intercourse pain .  ? ? ? ? ?Treatment:  ? ?  ?Manual: ?Hamstring lengthening stretch 60 seconds each LE ; leg on PT shoulder ?Piriformis stretch 60 seconds each LE with PT overpressure ?single knee to chest 60 seconds with each LE.  ?Cross body knee to opposite shoulder 60 seconds each LE ? Adduction lengthening stretch 60 seconds  ?  ?Prone: ?Grade II mobilizations to lumbar and thoracic UPA and CPA x 6 minutes  ?  ?Palpation of LE's for assessment of trigger points ?  ?TherEx ?Supine:  ?TrA contraction with posterior pelvic tilt and adduction 12x 3 second holds ?Bridge 15x with adduction ball squeeze  ?  Green swiss ball with TrA contraction between hands and knees 15x 3 second holds ?Green swiss ball with TrA contraction and UE raises 12x each UE  ?Green swiss ball with TrA contraction with UE and contralateral LE heel slide 12x each LE  ?  ? ?  ?Pt educated throughout session about proper posture and technique with exercises. Improved exercise technique, movement at target joints, use of target muscles after min to mod verbal, visual, tactile cues. ?  ? ? ? ?Trigger Point Dry Needling (TDN), unbilled ?Education performed with patient regarding potential benefit of TDN. Reviewed precautions and risks with patient. Reviewed special precautions/risks over lung fields which include pneumothorax. Reviewed signs and symptoms of pneumothorax and advised pt to go to ER immediately if these symptoms develop advise them of  dry needling treatment. Extensive time spent with pt to ensure full understanding of TDN risks. Pt provided verbal consent to treatment. TDN performed to  with 0.25 x 40 single needle placements with local twitch response (LTR). Pistoning technique utilized. Improved pain-free motion following intervention. Bilateral Lumbar paraspinals, L gluteal musculature  and obturates internus. X3 minutes ? ?Patient has met her back goals but continues to have incontinence and pain with intimacy indicating continued need for therapy for pelvic floor. She no longer needs to been seen by orthopedic PT at this time but will remain in cert in case patient has regression. Core activation interventions tolerated well with on pain. She has significant trigger points on L lumbar paraspinal and piriformis. Pt will benefit from skilled PT services to address deficits and return to pain-free function at home and work. ? ? ? ? ? ? ? ? ? PT Education - 10/14/21 0835   ? ? Education Details exercise technique, body mechanics, recert   ? Person(s) Educated Patient   ? Methods Explanation;Demonstration;Tactile cues;Verbal cues   ? Comprehension Verbalized understanding;Returned demonstration;Verbal cues required;Tactile cues required   ? ?  ?  ? ?  ? ? ? PT Short Term Goals - 10/07/21 1457   ? ?  ? PT SHORT TERM GOAL #1  ? Title Patient will be independent in home exercise program to improve strength/mobility for better functional independence with ADLs.   ? Baseline 2/28: HEP given   ? Time 4   ? Period Weeks   ? Status Achieved   ? Target Date 09/21/21   ? ?  ?  ? ?  ? ? ? ? PT Long Term Goals - 10/14/21 0900   ? ?  ? PT LONG TERM GOAL #1  ? Title Patient will increase FOTO score to equal to or greater than 40%    to demonstrate statistically significant improvement in mobility and quality of life.   ? Baseline 2/28: 10 , 4/18:  10 pts to  73 pts   ? Time 8   ? Period Weeks   ? Status Achieved   ? Target Date 10/19/21   ?  ? PT LONG TERM  GOAL #2  ? Title Pt will decrease mODI score by at least 13 points (32) in order demonstrate clinically significant reduction in back pain/disability.   ? Baseline 2/28: 45/50   , 10/12/21:  4 /50 pts   ? Time 8   ? Period Weeks   ? Status Achieved   ? Target Date 10/19/21   ?  ? PT LONG TERM GOAL #3  ? Title Pt will decrease worst back pain as reported on NPRS by at least 3 points   to 5/10 in order to demonstrate clinically significant reduction in back pain.   ? Baseline 2/28: 8/10 ,   10/12/21: no pain across 4 months   ? Time 8   ? Period Weeks   ? Status Achieved   ? Target Date 10/19/21   ?  ? PT LONG TERM GOAL #4  ? Title Patient will return to functional acitvity without muscle guarding or compensation to return to PLOF.   ? Baseline 2/28: frequently has to modify how to moves and bends to pick up her child  ( 4/13: improved body mechanics awareness and practice)   ? Time 8   ? Period Weeks   ? Status Achieved   ? Target Date 10/19/21   ?  ? PT LONG TERM GOAL #5  ? Title Patient will increase BLE gross strength to 4+/5 as to improve functional strength for independent gait, increased standing tolerance and increased ADL ability.   ? Baseline 2/28: see note 4/20: 4+/5   ? Time 8   ? Period Weeks   ? Status Achieved   ? Target Date 10/19/21   ?  ? PT LONG TERM GOAL #6  ? Title Pt will demo proper deep core coordination and co-activationo feet to promote pelvic gridel stability for long periods of horseback ing with less pain and to minimize relapse of tight adductor/glut mm and supination/ INV of feet for balance, to promote better mechanics for squats for lifting child and picking up sticks in yard without relapse of back pain.   ? Baseline dyscoordination of depe core system ( overuse of oblique mm , gluts, adductors) and poor co-activation of feet in standing and other positions with supination/ INV   ? Time 10   ? Period Weeks   ? Status On-going   ? Target Date 12/16/21   ?  ? PT LONG TERM GOAL #7  ? Title  Patient will have <2/10 pain s/p intercourse to improve quality of life.   ? Baseline 4/20: pelvic pt to assess   ? Time 8   ? Period Weeks   ? Status New   ? Target Date 12/09/21   ?  ? PT LONG TERM GOAL #8  ? Titl

## 2021-10-18 ENCOUNTER — Ambulatory Visit (INDEPENDENT_AMBULATORY_CARE_PROVIDER_SITE_OTHER): Payer: 59 | Admitting: Family Medicine

## 2021-10-19 ENCOUNTER — Ambulatory Visit: Payer: 59

## 2021-10-21 ENCOUNTER — Ambulatory Visit: Payer: 59 | Admitting: Physical Therapy

## 2021-10-21 DIAGNOSIS — M533 Sacrococcygeal disorders, not elsewhere classified: Secondary | ICD-10-CM | POA: Diagnosis not present

## 2021-10-21 DIAGNOSIS — M545 Low back pain, unspecified: Secondary | ICD-10-CM | POA: Diagnosis not present

## 2021-10-21 DIAGNOSIS — M6281 Muscle weakness (generalized): Secondary | ICD-10-CM | POA: Diagnosis not present

## 2021-10-21 NOTE — Therapy (Signed)
[facility] ?[place], [place], [address] ?Phone: [phone]   Fax:  [phone] ? ?Physical Therapy Treatment ? ?Patient Details  ?Name: Christine Adams ?MRN: [id number] ?Date of Birth: 1981-01-11 ?Referring Provider (PT): Dr Izora Ribas ? ? ?Encounter Date: 10/21/2021 ? ? PT End of Session - 10/21/21 0809   ? ? Visit Number 13   ? Number of Visits 28   ? Date for PT Re-Evaluation 12/09/21   ? Authorization Type eval 2/28, PN #10 note 10/07/21,   ? PT Start Time 0805   ? PT Stop Time 0900   ? PT Time Calculation (min) 55 min   ? Activity Tolerance Patient tolerated treatment well   ? Behavior During Therapy [facility] for tasks assessed/performed   ? ?  ?  ? ?  ? ? ?Past Medical History:  ?Diagnosis Date  ? ADHD   ? Asthma   ? Back pain   ? BMI 30.0-30.9,adult   ? Hypothyroidism   ? Age 19 yrs  ? Other fatigue   ? Postpartum anxiety- ongoing   ? Postpartum depression   ? Rosacea   ? Shortness of breath on exertion   ? Vitamin D deficiency   ? ? ?Past Surgical History:  ?Procedure Laterality Date  ? WISDOM TOOTH EXTRACTION    ? ? ?There were no vitals filed for this visit. ? ? Subjective Assessment - 10/21/21 0811   ? ? Subjective Patient [place] with her toddler and sometimes holding him.Pt feel her back stiff afterwards but had no pain. Pt is compliant with her HEP   ? Pertinent History Patient is a 41 year old female who presents for lumbar radiculitis. 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 After her back went out in December 2022, had another spasm, went to Dr. Izora Ribas, had two rounds of steroids. Had and MRI which showed: Mild degenerative changes, with disc desiccation and mild disc bulges at L4-L5 and L5-S1.Pain is improving, waxes and wanes. Patient is very active, she rides horses   ? Limitations Sitting;Lifting;House hold activities;Other (comment)   ? How long can you sit comfortably? 20  minutes -30 minutes   ? How long can you stand comfortably? no limitations   ? How long can you walk comfortably? no limitations   ? Diagnostic tests :MRI:  on 12/19  1. Mild degenerative changes, with disc desiccation and mild disc  bulges at L4-L5 and L5-S1.  2. No spinal canal stenosis or neural foraminal narrowing.   ? Patient Stated Goals not hurt, move better, not limit life because of the back and pelvic pain.   ? ?  ?  ? ?  ? ? ? ? ? [facility] PT Assessment - 10/21/21 0852   ? ?  ? Coordination  ? Coordination and Movement Description poor eccentric control with step and down   ?  ? Strength  ? Overall Strength Comments PF unilateral UE on counter, L 10 reps, 15 reps, 4/5 MMT   ?  ? Palpation  ? Palpation comment hypomobile midfoot joints at B foot ( L > R), lateral / medial leg/ ankle tightness B   ? ?  ?  ? ?  ? ? ? ? ? ? ? ? ? ? ? ? ? ? ? ? [facility] PT Treatment/Exercise - 10/21/21 0858   ? ?  ? Neuro Re-ed   ?  Neuro Re-ed Details  cued for new HEP for plantar fascia recoil strength and PF strengthening   ?  ? Manual Therapy  ? Manual therapy comments STM/MWM PA/AP mob at B midfoot joints/ medial/lateral leg  to promote DF/EV, toe abduction, knee abduction   ? ?  ?  ? ?  ? ? ? ? ? ? ? ? ? ? ? ? PT Short Term Goals - 10/07/21 1457   ? ?  ? PT SHORT TERM GOAL #1  ? Title Patient will be independent in home exercise program to improve strength/mobility for better functional independence with ADLs.   ? Baseline 2/28: HEP given   ? Time 4   ? Period Weeks   ? Status Achieved   ? Target Date 09/21/21   ? ?  ?  ? ?  ? ? ? ? PT Long Term Goals - 10/14/21 0900   ? ?  ? PT LONG TERM GOAL #1  ? Title Patient will increase FOTO score to equal to or greater than 40%    to demonstrate statistically significant improvement in mobility and quality of life.   ? Baseline 2/28: 10 , 4/18:  10 pts to  73 pts   ? Time 8   ? Period Weeks   ? Status Achieved   ? Target Date 10/19/21   ?  ? PT LONG TERM GOAL #2  ? Title Pt will  decrease mODI score by at least 13 points (32) in order demonstrate clinically significant reduction in back pain/disability.   ? Baseline 2/28: 45/50   , 10/12/21:  4 /50 pts   ? Time 8   ? Period Weeks   ? Status Achieved   ? Target Date 10/19/21   ?  ? PT LONG TERM GOAL #3  ? Title Pt will decrease worst back pain as reported on NPRS by at least 3 points to 5/10 in order to demonstrate clinically significant reduction in back pain.   ? Baseline 2/28: 8/10 ,   10/12/21: no pain across 4 months   ? Time 8   ? Period Weeks   ? Status Achieved   ? Target Date 10/19/21   ?  ? PT LONG TERM GOAL #4  ? Title Patient will return to functional acitvity without muscle guarding or compensation to return to PLOF.   ? Baseline 2/28: frequently has to modify how to moves and bends to pick up her child  ( 4/13: improved body mechanics awareness and practice)   ? Time 8   ? Period Weeks   ? Status Achieved   ? Target Date 10/19/21   ?  ? PT LONG TERM GOAL #5  ? Title Patient will increase BLE gross strength to 4+/5 as to improve functional strength for independent gait, increased standing tolerance and increased ADL ability.   ? Baseline 2/28: see note 4/20: 4+/5   ? Time 8   ? Period Weeks   ? Status Achieved   ? Target Date 10/19/21   ?  ? PT LONG TERM GOAL #6  ? Title Pt will demo proper deep core coordination and co-activationo feet to promote pelvic gridel stability for long periods of horseback ing with less pain and to minimize relapse of tight adductor/glut mm and supination/ INV of feet for balance, to promote better mechanics for squats for lifting child and picking up sticks in yard without relapse of back pain.   ? Baseline dyscoordination of depe core system (  overuse of oblique mm , gluts, adductors) and poor co-activation of feet in standing and other positions with supination/ INV   ? Time 10   ? Period Weeks   ? Status On-going   ? Target Date 12/16/21   ?  ? PT LONG TERM GOAL #7  ? Title Patient will have <2/10  pain s/p intercourse to improve quality of life.   ? Baseline 4/20: pelvic pt to assess   ? Time 8   ? Period Weeks   ? Status New   ? Target Date 12/09/21   ?  ? PT LONG TERM GOAL #8  ? Title Patient will report decreased episodes of incontinance to improve quality of life and work   ? Baseline 4/20: pelvic PT to assess   ? Time 8   ? Period Weeks   ? Status New   ? Target Date 12/09/21   ? ?  ?  ? ?  ? ? ? ? ? ? ? ? Plan - 10/21/21 0947   ? ? Clinical Impression Statement Pt shows improved postural and pelvic stability as she was able to walk long hours and [place] the [place] without relapse of pain.  ? ?Required further manual Tx to promote L foot DF?EV and toes abduction and ER of knee/ hip. Advanced to plantar strengthening of arches and mm. Required cued for improved eccentric control of BLE in step down and knee propioception. Pt continues to benefit from skilled PT to minimize overactivity of pelvic floor and SIJ mm to achieve remaining goals.,  ? Personal Factors and Comorbidities Age;Comorbidity 3+;Profession;Past/Current Experience;Time since onset of injury/illness/exacerbation   ? Comorbidities ADHD, asthma, fatigue, sleep disorder, anxiety, allergies, hypothyroidism   ? Examination-Activity Limitations Bed Mobility;Caring for Others;Bend;Carry;Lift;Sit;Squat;Stairs;Stand;Transfers   ? Examination-Participation Restrictions Cleaning;Driving;Laundry;Occupation;Shop;Volunteer;Yard Work;Other   ? Stability/Clinical Decision Making Stable/Uncomplicated   ? Rehab Potential Fair   ? PT Frequency 2x / week   ? PT Duration 8 weeks   ? PT Treatment/Interventions ADLs/Self Care Home Management;Aquatic Therapy;Cryotherapy;Electrical Stimulation;Moist Heat;Traction;Ultrasound;DME Instruction;Canalith Repostioning;Gait Scientist, forensic;Therapeutic exercise;Therapeutic activities;Functional mobility training;Balance training;Neuromuscular re-education;Patient/family education;Manual  techniques;Passive range of motion;Dry needling;Energy conservation;Splinting;Taping;Vestibular;Joint Manipulations;Spinal Manipulations;Visual/perceptual remediation/compensation   ? PT Next Visit Plan pelvic floo

## 2021-10-21 NOTE — Patient Instructions (Signed)
Strengthening feet arches:  ? ? ?Heel raises - heels together, minisquat ? ?Minisquat motion, trunk bent , gaze onto floor like you are looking at your reflection over a lake/pond, ? ?Knees bent pointed out like a "v" , navel ( center of mass) more forward  ?Heels together as you lift, pointed out like a "v"  ?KNEES ARE ALIGNED BEHIND THE TOES TO MINIMIZE STRAIN ON THE KNEES your  navel ( center of mass) more forward to a ?avoid dropping down fast and rocking more weight back onto heels , keep heels pressing against each other the whole time ? ? ?10 reps  ? ?________ ? ?Single heel raise on L work up to 10 reps x 3 set a day ? Calf stretch  ? ? ?_________ ? ?Step ups on stool  ? ?Step up L up, R up, L down, R down, exhale up and down, lower heel slow and controlled, shoulder slightly forward, center of mass moves after ballmounds contacts  ?10 x 3 reps each  ? ?_________ ?

## 2021-10-22 ENCOUNTER — Other Ambulatory Visit (HOSPITAL_COMMUNITY): Payer: Self-pay

## 2021-10-26 ENCOUNTER — Ambulatory Visit: Payer: 59 | Attending: Neurosurgery | Admitting: Physical Therapy

## 2021-10-26 ENCOUNTER — Other Ambulatory Visit: Payer: Self-pay

## 2021-10-26 DIAGNOSIS — M545 Low back pain, unspecified: Secondary | ICD-10-CM | POA: Diagnosis not present

## 2021-10-26 DIAGNOSIS — M533 Sacrococcygeal disorders, not elsewhere classified: Secondary | ICD-10-CM | POA: Diagnosis not present

## 2021-10-26 DIAGNOSIS — M6281 Muscle weakness (generalized): Secondary | ICD-10-CM | POA: Diagnosis not present

## 2021-10-26 NOTE — Therapy (Signed)
[facility] ?[place], [place], [address] ?Phone: [phone]   Fax:  [phone] ? ?Physical Therapy Treatment ? ?Patient Details  ?Name: Christine Adams ?MRN: [id number] ?Date of Birth: 1981/03/17 ?Referring Provider (PT): Dr Izora Ribas ? ? ?Encounter Date: 10/26/2021 ? ? PT End of Session - 10/26/21 0818   ? ? Visit Number 14   ? Number of Visits 28   ? Date for PT Re-Evaluation 12/09/21   ? Authorization Type eval 2/28, PN #10 note 10/07/21,   ? PT Start Time [phone]   ? PT Stop Time 0900   ? PT Time Calculation (min) 50 min   ? Activity Tolerance Patient tolerated treatment well   ? Behavior During Therapy [facility] for tasks assessed/performed   ? ?  ?  ? ?  ? ? ?Past Medical History:  ?Diagnosis Date  ? ADHD   ? Asthma   ? Back pain   ? BMI 30.0-30.9,adult   ? Hypothyroidism   ? Age 41 yrs  ? Other fatigue   ? Postpartum anxiety- ongoing   ? Postpartum depression   ? Rosacea   ? Shortness of breath on exertion   ? Vitamin D deficiency   ? ? ?Past Surgical History:  ?Procedure Laterality Date  ? WISDOM TOOTH EXTRACTION    ? ? ?There were no vitals filed for this visit. ? ? Subjective Assessment - 10/26/21 0812   ? ? Subjective Pt reported her feet were sore after last session. Pt had car riding this past weekend and noticed more lumbar pain at R low back.   ? Pertinent History Patient is a 41 year old female who presents for lumbar radiculitis. 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 After her back went out in December 2022, had another spasm, went to Dr. Izora Ribas, had two rounds of steroids. Had and MRI which showed: Mild degenerative changes, with disc desiccation and mild disc bulges at L4-L5 and L5-S1.Pain is improving, waxes and wanes. Patient is very active, she rides horses   ? Limitations Sitting;Lifting;House hold activities;Other (comment)   ? How long can you sit comfortably? 20 minutes -30 minutes   ? How long can you stand  comfortably? no limitations   ? How long can you walk comfortably? no limitations   ? Diagnostic tests :MRI:  on 12/19  1. Mild degenerative changes, with disc desiccation and mild disc  bulges at L4-L5 and L5-S1.  2. No spinal canal stenosis or neural foraminal narrowing.   ? Patient Stated Goals not hurt, move better, not limit life because of the back and pelvic pain.   ? ?  ?  ? ?  ? ? ? ? ? [facility] PT Assessment - 10/26/21 0905   ? ?  ? Squat  ? Comments feet pointed straight, knees over toes   ?  ? Lunges  ? Comments poor alignment with adducted knees   ? ?  ?  ? ?  ? ? ? ? ? ? ? ? ? ? ? ? ? ? ? ? [facility] Adult PT Treatment/Exercise - 10/26/21 0906   ? ?  ? Therapeutic Activites   ? Other Therapeutic Activities cued for body mechanics when brushing horse, picking up items off floor, squats   ?  ? Neuro Re-ed   ? Neuro Re-ed Details  cued for propioception of legs/knees/feet,  proper alignment for lunges and squats ,   ? ?  ?  ? ?  ? ? ? ? ? ? ? ? ? ? ? ?  PT Short Term Goals - 10/07/21 1457   ? ?  ? PT SHORT TERM GOAL #1  ? Title Patient will be independent in home exercise program to improve strength/mobility for better functional independence with ADLs.   ? Baseline 2/28: HEP given   ? Time 4   ? Period Weeks   ? Status Achieved   ? Target Date 09/21/21   ? ?  ?  ? ?  ? ? ? ? PT Long Term Goals - 10/14/21 0900   ? ?  ? PT LONG TERM GOAL #1  ? Title Patient will increase FOTO score to equal to or greater than 40%    to demonstrate statistically significant improvement in mobility and quality of life.   ? Baseline 2/28: 10 , 4/18:  10 pts to  73 pts   ? Time 8   ? Period Weeks   ? Status Achieved   ? Target Date 10/19/21   ?  ? PT LONG TERM GOAL #2  ? Title Pt will decrease mODI score by at least 13 points (32) in order demonstrate clinically significant reduction in back pain/disability.   ? Baseline 2/28: 45/50   , 10/12/21:  4 /50 pts   ? Time 8   ? Period Weeks   ? Status Achieved   ? Target Date 10/19/21   ?  ?  PT LONG TERM GOAL #3  ? Title Pt will decrease worst back pain as reported on NPRS by at least 3 points to 5/10 in order to demonstrate clinically significant reduction in back pain.   ? Baseline 2/28: 8/10 ,   10/12/21: no pain across 4 months   ? Time 8   ? Period Weeks   ? Status Achieved   ? Target Date 10/19/21   ?  ? PT LONG TERM GOAL #4  ? Title Patient will return to functional acitvity without muscle guarding or compensation to return to PLOF.   ? Baseline 2/28: frequently has to modify how to moves and bends to pick up her child  ( 4/13: improved body mechanics awareness and practice)   ? Time 8   ? Period Weeks   ? Status Achieved   ? Target Date 10/19/21   ?  ? PT LONG TERM GOAL #5  ? Title Patient will increase BLE gross strength to 4+/5 as to improve functional strength for independent gait, increased standing tolerance and increased ADL ability.   ? Baseline 2/28: see note 4/20: 4+/5   ? Time 8   ? Period Weeks   ? Status Achieved   ? Target Date 10/19/21   ?  ? PT LONG TERM GOAL #6  ? Title Pt will demo proper deep core coordination and co-activationo feet to promote pelvic gridel stability for long periods of horseback ing with less pain and to minimize relapse of tight adductor/glut mm and supination/ INV of feet for balance, to promote better mechanics for squats for lifting child and picking up sticks in yard without relapse of back pain.   ? Baseline dyscoordination of depe core system ( overuse of oblique mm , gluts, adductors) and poor co-activation of feet in standing and other positions with supination/ INV   ? Time 10   ? Period Weeks   ? Status On-going   ? Target Date 12/16/21   ?  ? PT LONG TERM GOAL #7  ? Title Patient will have <2/10 pain s/p intercourse to improve quality of life.   ?  Baseline 4/20: pelvic pt to assess   ? Time 8   ? Period Weeks   ? Status New   ? Target Date 12/09/21   ?  ? PT LONG TERM GOAL #8  ? Title Patient will report decreased episodes of incontinance to  improve quality of life and work   ? Baseline 4/20: pelvic PT to assess   ? Time 8   ? Period Weeks   ? Status New   ? Target Date 12/09/21   ? ?  ?  ? ?  ? ? ? ? ? ? ? ? Plan - 10/26/21 0819   ? ? Clinical Impression Statement Pt is showed improved feet mobility but limited toe extension which limited her lunges ( for horse chores)  and t/f to pick items off floor. Bending is a task that typically caused LBP in the past. Focused on neuromuscular re-education with alignment of lower kinetic chain to promote better alignment to minimize adducted knees and medial collapse of foot arches. Pt demo'd proper alignment post Tx. Pt continues to benefit from skilled PT. Plan to add multidifis and thoracolumbar straightening next session.  ? Personal Factors and Comorbidities Age;Comorbidity 3+;Profession;Past/Current Experience;Time since onset of injury/illness/exacerbation   ? Comorbidities ADHD, asthma, fatigue, sleep disorder, anxiety, allergies, hypothyroidism   ? Examination-Activity Limitations Bed Mobility;Caring for Others;Bend;Carry;Lift;Sit;Squat;Stairs;Stand;Transfers   ? Examination-Participation Restrictions Cleaning;Driving;Laundry;Occupation;Shop;Volunteer;Yard Work;Other   ? Stability/Clinical Decision Making Stable/Uncomplicated   ? Rehab Potential Fair   ? PT Frequency 2x / week   ? PT Duration 8 weeks   ? PT Treatment/Interventions ADLs/Self Care Home Management;Aquatic Therapy;Cryotherapy;Electrical Stimulation;Moist Heat;Traction;Ultrasound;DME Instruction;Canalith Repostioning;Gait Scientist, forensic;Therapeutic exercise;Therapeutic activities;Functional mobility training;Balance training;Neuromuscular re-education;Patient/family education;Manual techniques;Passive range of motion;Dry needling;Energy conservation;Splinting;Taping;Vestibular;Joint Manipulations;Spinal Manipulations;Visual/perceptual remediation/compensation   ? PT Next Visit Plan pelvic floor PT   ? PT Home Exercise Plan see above    ? Consulted and Agree with Plan of Care Patient   ? ?  ?  ? ?  ? ? ?Patient will benefit from skilled therapeutic intervention in order to improve the following deficits and impairments:  Abnormal gait, Tennis Must

## 2021-10-26 NOTE — Patient Instructions (Addendum)
? ? ?  Back stretch with car rides: ? ? ?kitchen counter stretches ? ?Hands on kitchen counter,  ? ?Palms shoulder width apart  ?Minisquat postion ?Trunk is parallel to floor ? ?A) Pull buttocks back to lengthen spine, knees bent  ?3 breaths ? ? ?B) Bring R hand to the L, and stretch the R side trunk  ?3 breaths  ? ?Brings hands to center again ?Do the same to the L side stretch by placing L hand on top of R  ? ?D) Modified thread the needle ?R hand on L thigh, ?L  thigh pushing out slightly as the R hands pull in,  elbow bent and pulls to theR,  ?Look under L armpit  ? ?Do the same to other side ? ?____ ? ? ?LUNGE POSITION : alignment awareness ? ?1) ski track ,then back foot heel down, ? ?Feet are hip width apart, R foot one behind like you are on ski tracks,  L knee bent over ankle but not more forward then the ankle.  ?Make sure 50% weight is in the front foot/leg , 50% weight is the back foot/ leg  ?  ? ?2) two options for back foot  ?Pick up something from the ground, back foot and toes point straight , heel up  ? ?When combing horse or standing 45 deg to changing table. Stroller,  ? - mindful of stability, after ski track , heel down , then turn toes to 30 deg  ? ? ?____ ?Feet care :  Self -feet massage  ? ?Handshake : fingers between toes, moving ballmounds/toes back and forth several times while other hand anchors at arch. Do the same at the hind/mid foot.  ?Heel to toes upward to a letter Big Letter T strokes to spread ballmounds and toes, several times, pinch between webs of toes  ?Run finger tips along top of foot between long bones "comb between the bones"  ? ? ?Wiggle toes and spread them out when relaxing  ? ?____ ? ? ?Car seat modification ? ?Folded towels to fill bucket seat ?Folded beach towel long ways against back of seat into head rest to fill in the space so your head and back and sacrum aligns  ?Hands at 9 and 3 o clock  ?Feet and heel on the mat  ?Back of the hips against the seat evenly    ? ?____ ? ? ?

## 2021-10-28 ENCOUNTER — Ambulatory Visit: Payer: 59

## 2021-10-28 NOTE — Progress Notes (Signed)
? ? ? ?Chief Complaint:  ? ?OBESITY ?Christine Adams is here to discuss her progress with her obesity treatment plan along with follow-up of her obesity related diagnoses. [name] is on the Category 2 Plan with breakfast and lunch options and states she is following her eating plan approximately 75% of the time. [name] states she is doing physical therapy. ? ?Today's visit was #: 6 ?Starting weight: 195 lbs ?Starting date: 08/04/2021 ?Today's weight: 180 lbs ?Today's date: 10/13/2021 ?Total lbs lost to date: 31 ?Total lbs lost since last in-office visit: 3 ? ?Interim History: Christine Adams had a 3nd birthday party for her son. She had a long weekend of celebrating and vacation. Christine Adams nails breakfast and lunch and does well to stay on the plan. For dinner, she is so tired that at times, it's hard "to get it all in". Christine Adams is currently doing P.T. for her back pain and is not able to exercise like she'd like to. ? ?Subjective:  ? ?1. Insulin resistance ?[name] is not having hunger at all and denies cravings. At times, she finds it difficult to get it all in and eat all the foods on the plan. ? ?2. Stress and adjustment reaction ?[name]'s mood is under very good control currently. She denies issues and is having much less stress at work. ? ?Assessment/Plan:  ?No orders of the defined types were placed in this encounter. ? ? ?There are no discontinued medications.  ? ?No orders of the defined types were placed in this encounter. ?  ? ?1. Insulin resistance ?Christine Adams agrees to continue taking the same dose of Wegovy with no need for refill as she just picked it up yesterday. She agrees to continue her PNP and weight loss. ? ?2. Stress and adjustment reaction ?Christine Adams agrees to continue taking her medication Zoloft per psychiatry and meeting with her counselor. She agrees to continue self care activities and to follow up as directed. ? ?3. Obesity with current BMI of 30.1 ?Christine Adams is currently in the action stage of change. As such, her goal  is to continue with weight loss efforts. She has agreed to the Category 2 Plan with breakfast and lunch options.  ? ?Exercise goals:  As is per P.T. ? ?Behavioral modification strategies: no skipping meals. ? ?Christine Adams has agreed to follow-up with our clinic in 3 weeks. She was informed of the importance of frequent follow-up visits to maximize her success with intensive lifestyle modifications for her multiple health conditions.  ? ?Objective:  ? ?Blood pressure 108/71, pulse 61, temperature (!) 97.5 ?F (36.4 ?C), height '5\' 5"'$  (1.651 m), weight 180 lb (81.6 kg), SpO2 97 %, currently breastfeeding. ?Body mass index is 29.95 kg/m?. ? ?General: Cooperative, alert, well developed, in no acute distress. ?HEENT: Conjunctivae and lids unremarkable. ?Cardiovascular: Regular rhythm.  ?Lungs: Normal work of breathing. ?Neurologic: No focal deficits.  ? ?Lab Results  ?Component Value Date  ? CREATININE 0.70 08/04/2021  ? BUN 13 08/04/2021  ? NA 140 08/04/2021  ? K 4.3 08/04/2021  ? CL 103 08/04/2021  ? CO2 20 08/04/2021  ? ?Lab Results  ?Component Value Date  ? ALT 24 08/04/2021  ? AST 22 08/04/2021  ? ALKPHOS 92 08/04/2021  ? BILITOT 0.5 08/04/2021  ? ?Lab Results  ?Component Value Date  ? HGBA1C 5.1 08/04/2021  ? HGBA1C 5.2 12/06/2016  ? ?Lab Results  ?Component Value Date  ? INSULIN 13.3 08/04/2021  ? ?Lab Results  ?Component Value Date  ? TSH 2.240 08/04/2021  ? ?Lab  Results  ?Component Value Date  ? CHOL 164 08/04/2021  ? HDL 32 (L) 08/04/2021  ? [name] 94 08/04/2021  ? LDLDIRECT 105.0 02/14/2020  ? TRIG 225 (H) 08/04/2021  ? CHOLHDL 5 02/14/2020  ? ?Lab Results  ?Component Value Date  ? VD25OH 57.5 08/04/2021  ? VD25OH 51.25 12/09/2020  ? ?Lab Results  ?Component Value Date  ? WBC 6.6 08/04/2021  ? HGB 15.7 08/04/2021  ? HCT 47.1 (H) 08/04/2021  ? MCV 88 08/04/2021  ? PLT 283 08/04/2021  ? ?Lab Results  ?Component Value Date  ? IRON 87 12/09/2020  ? FERRITIN 54.9 12/09/2020  ? ?Attestation Statements:  ? ?Reviewed by  clinician on day of visit: allergies, medications, problem list, medical history, surgical history, family history, social history, and previous encounter notes. ? ?I, Marcille Blanco, CMA, am acting as transcriptionist for [employer], DO ? ?I have reviewed the above documentation for accuracy and completeness, and I agree with the above. Marjory Sneddon, D.O. ? ?The [place] was signed into law in 2016 which includes the topic of electronic health records.  This provides immediate access to information in MyChart.  This includes consultation notes, operative notes, office notes, lab results and pathology reports.  If you have any questions about what you read please let us know at your next visit so we can discuss your concerns and take corrective action if need be.  We are right here with you. ? ?

## 2021-11-02 ENCOUNTER — Ambulatory Visit: Payer: 59

## 2021-11-03 ENCOUNTER — Other Ambulatory Visit: Payer: Self-pay

## 2021-11-03 ENCOUNTER — Encounter (INDEPENDENT_AMBULATORY_CARE_PROVIDER_SITE_OTHER): Payer: Self-pay | Admitting: Family Medicine

## 2021-11-03 ENCOUNTER — Ambulatory Visit (INDEPENDENT_AMBULATORY_CARE_PROVIDER_SITE_OTHER): Payer: 59 | Admitting: Family Medicine

## 2021-11-03 VITALS — BP 130/83 | HR 66 | Temp 97.8°F | Ht 65.0 in | Wt 177.0 lb

## 2021-11-03 DIAGNOSIS — Z6829 Body mass index (BMI) 29.0-29.9, adult: Secondary | ICD-10-CM

## 2021-11-03 DIAGNOSIS — E8881 Metabolic syndrome: Secondary | ICD-10-CM | POA: Diagnosis not present

## 2021-11-03 DIAGNOSIS — E669 Obesity, unspecified: Secondary | ICD-10-CM | POA: Diagnosis not present

## 2021-11-03 MED ORDER — SEMAGLUTIDE (1 MG/DOSE) 4 MG/3ML ~~LOC~~ SOPN
1.0000 mg | PEN_INJECTOR | SUBCUTANEOUS | 0 refills | Status: DC
  Filled 2021-11-03: qty 3, 28d supply, fill #0

## 2021-11-04 ENCOUNTER — Ambulatory Visit: Payer: 59 | Admitting: Physical Therapy

## 2021-11-05 ENCOUNTER — Other Ambulatory Visit: Payer: Self-pay

## 2021-11-05 MED ORDER — WEGOVY 1 MG/0.5ML ~~LOC~~ SOAJ
1.0000 mg | SUBCUTANEOUS | 0 refills | Status: DC
  Filled 2021-11-05 – 2021-11-16 (×2): qty 2, 30d supply, fill #0

## 2021-11-09 ENCOUNTER — Ambulatory Visit: Payer: 59

## 2021-11-11 ENCOUNTER — Ambulatory Visit: Payer: 59 | Admitting: Physical Therapy

## 2021-11-11 DIAGNOSIS — M6281 Muscle weakness (generalized): Secondary | ICD-10-CM

## 2021-11-11 DIAGNOSIS — M533 Sacrococcygeal disorders, not elsewhere classified: Secondary | ICD-10-CM | POA: Diagnosis not present

## 2021-11-11 DIAGNOSIS — M545 Low back pain, unspecified: Secondary | ICD-10-CM

## 2021-11-11 NOTE — Therapy (Signed)
[place] MAIN [facility] SERVICES [place], [place], [address] Phone: [phone]   Fax:  [phone]  Physical Therapy Treatment  Patient Details  Name: Christine Adams MRN: [id number] Date of Birth: 1981-03-28 Referring Provider (PT): Dr Izora Ribas   Encounter Date: 11/11/2021   PT End of Session - 11/11/21 0816     Visit Number 15    Number of Visits 28    Date for PT Re-Evaluation 12/09/21    Authorization Type eval 2/28, PN #10 note 10/07/21,    PT Start Time 0807    PT Stop Time 0900    PT Time Calculation (min) 53 min    Activity Tolerance Patient tolerated treatment well    Behavior During Therapy [facility] for tasks assessed/performed             Past Medical History:  Diagnosis Date   ADHD    Asthma    Back pain    BMI 30.0-30.9,adult    Hypothyroidism    Age 50 yrs   Other fatigue    Postpartum anxiety- ongoing    Postpartum depression    Rosacea    Shortness of breath on exertion    Vitamin D deficiency     Past Surgical History:  Procedure Laterality Date   WISDOM TOOTH EXTRACTION      There were no vitals filed for this visit.   Subjective Assessment - 11/11/21 0811     Subjective Pt reported her husband has compression syndrome and will have to undergo surgery ( disc replacement ).    Pertinent History Patient is a 41 year old female who presents for lumbar radiculitis. 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 After her back went out in December 2022, had another spasm, went to Dr. Izora Ribas, had two rounds of steroids. Had and MRI which showed: Mild degenerative changes, with disc desiccation and mild disc bulges at L4-L5 and L5-S1.Pain is improving, waxes and wanes. Patient is very active, she rides horses    Limitations Sitting;Lifting;House hold activities;Other (comment)    How long can you sit comfortably? 20 minutes -30 minutes    How long can you stand comfortably? no  limitations    How long can you walk comfortably? no limitations    Diagnostic tests :MRI:  on 12/19  1. Mild degenerative changes, with disc desiccation and mild disc  bulges at L4-L5 and L5-S1.  2. No spinal canal stenosis or neural foraminal narrowing.    Patient Stated Goals not hurt, move better, not limit life because of the back and pelvic pain.                [facility] PT Assessment - 11/11/21 1439       Observation/Other Assessments   Observations less cues for squats,  minor cues for multifidis twist      Coordination   Coordination and Movement Description slight forward head posture,                           [facility] Adult PT Treatment/Exercise - 11/11/21 0859       Therapeutic Activites    Other Therapeutic Activities modified her HEP to maintain compliance while she is needing to care for husband,      Neuro Re-ed    Neuro Re-ed Details  cued more cervical retraction in new HEP, cued for multidifis ( diassociation of trunk/ pelvis) ,  PT Short Term Goals - 10/07/21 1457       PT SHORT TERM GOAL #1   Title Patient will be independent in home exercise program to improve strength/mobility for better functional independence with ADLs.    Baseline 2/28: HEP given    Time 4    Period Weeks    Status Achieved    Target Date 09/21/21               PT Long Term Goals - 11/11/21 1441       PT LONG TERM GOAL #1   Title Patient will increase FOTO score to equal to or greater than 40%    to demonstrate statistically significant improvement in mobility and quality of life.    Baseline 2/28: 10 , 4/18:  10 pts to  73 pts    Time 8    Period Weeks    Status Achieved    Target Date 10/19/21      PT LONG TERM GOAL #2   Title Pt will decrease mODI score by at least 13 points (32) in order demonstrate clinically significant reduction in back pain/disability.    Baseline 2/28: 45/50   , 10/12/21:  4 /50 pts    Time  8    Period Weeks    Status Achieved    Target Date 10/19/21      PT LONG TERM GOAL #3   Title Pt will decrease worst back pain as reported on NPRS by at least 3 points to 5/10 in order to demonstrate clinically significant reduction in back pain.    Baseline 2/28: 8/10 ,   10/12/21: no pain across 4 months    Time 8    Period Weeks    Status Achieved    Target Date 10/19/21      PT LONG TERM GOAL #4   Title Patient will return to functional acitvity without muscle guarding or compensation to return to PLOF.    Baseline 2/28: frequently has to modify how to moves and bends to pick up her child  ( 4/13: improved body mechanics awareness and practice)    Time 8    Period Weeks    Status Achieved    Target Date 10/19/21      PT LONG TERM GOAL #5   Title Patient will increase BLE gross strength to 4+/5 as to improve functional strength for independent gait, increased standing tolerance and increased ADL ability.    Baseline 2/28: see note 4/20: 4+/5    Time 8    Period Weeks    Status Achieved    Target Date 10/19/21      PT LONG TERM GOAL #6   Title Pt will demo proper deep core coordination and co-activationo feet to promote pelvic gridel stability for long periods of horseback ing with less pain and to minimize relapse of tight adductor/glut mm and supination/ INV of feet for balance, to promote better mechanics for squats for lifting child and picking up sticks in yard without relapse of back pain.    Baseline dyscoordination of depe core system ( overuse of oblique mm , gluts, adductors) and poor co-activation of feet in standing and other positions with supination/ INV    Time 10    Period Weeks    Status On-going    Target Date 12/16/21      PT LONG TERM GOAL #7   Title Patient will have <2/10 pain s/p intercourse to improve quality of life.  Baseline 4/20: pelvic pt to assess    Time 8    Period Weeks    Status On-going    Target Date 12/09/21      PT LONG TERM GOAL  #8   Title Patient will report decreased episodes of incontinance to improve quality of life and work    Baseline 4/20: pelvic PT to assess    Time 8    Period Weeks    Status On-going    Target Date 12/09/21                   Plan - 11/11/21 0902     Clinical Impression Statement Pt demonstrated improved pelvic and spinal stability .  Pt has been doing more household chores and horse barn duties while husband is debilitated from an injury and she reported only feeling spinal stiffness.   Pt demo'd good carry over with posture without needing cues. Selected progression of strengthening exercises that fits into her busy schedule. Pt demo'd correct form after cues. Pt continues to benefit from skilled PT. Plan to advance to cable collumn strengthening at next session and further assess pelvic floor.       Personal Factors and Comorbidities Age;Comorbidity 3+;Profession;Past/Current Experience;Time since onset of injury/illness/exacerbation    Comorbidities ADHD, asthma, fatigue, sleep disorder, anxiety, allergies, hypothyroidism    Examination-Activity Limitations Bed Mobility;Caring for Others;Bend;Carry;Lift;Sit;Squat;Stairs;Stand;Transfers    Examination-Participation Restrictions Cleaning;Driving;Laundry;Occupation;Shop;Volunteer;Yard Work;Other    Stability/Clinical Decision Making Stable/Uncomplicated    Rehab Potential Fair    PT Frequency 1x / week    PT Duration Other (comment)   10   PT Treatment/Interventions ADLs/Self Care Home Management;Aquatic Therapy;Cryotherapy;Electrical Stimulation;Moist Heat;Traction;Ultrasound;DME Instruction;Canalith Repostioning;Gait Scientist, forensic;Therapeutic exercise;Therapeutic activities;Functional mobility training;Balance training;Neuromuscular re-education;Patient/family education;Manual techniques;Passive range of motion;Dry needling;Energy conservation;Splinting;Taping;Vestibular;Joint Manipulations;Spinal  Manipulations;Visual/perceptual remediation/compensation    PT Next Visit Plan pelvic floor PT    PT Home Exercise Plan see above    Consulted and Agree with Plan of Care Patient             Patient will benefit from skilled therapeutic intervention in order to improve the following deficits and impairments:  Abnormal gait, Decreased endurance, Decreased range of motion, Decreased mobility, Hypomobility, Difficulty walking, Impaired flexibility, Postural dysfunction, Improper body mechanics, Pain  Visit Diagnosis: Sacrococcygeal disorders, not elsewhere classified  Acute bilateral low back pain, unspecified whether sciatica present  Muscle weakness (generalized)     Problem List Patient Active Problem List   Diagnosis Date Noted   Stress and adjustment reaction 10/02/2021   Insulin resistance 10/02/2021   Chronic low back pain 06/01/2021   Fatigue 12/09/2020   BMI 30.0-30.9,adult    Encounter for induction of labor 10/11/2019   Rosacea 12/10/2018   Asthma 11/27/2017   Preventative health care 12/08/2016   Hypothyroidism 05/25/2016   Attention deficit hyperactivity disorder (ADHD) 05/25/2016   Sleep walking disorder 01/22/2015   Dysplasia of cervix, low grade (CIN 1) 11/25/2013   Pap smear abnormality of cervix/human papillomavirus (HPV) positive 11/25/2013   Sleep disorder, circadian, shift work type 06/07/2013   Chronic cough 02/27/2013   Anxiety 01/21/2013   Seasonal allergies 01/21/2013    Jerl Mina, PT 11/11/2021, 2:41 PM  [place] [address] [place], [place], [address] Phone: [phone]   Fax:  [phone]  Name: Christine Adams MRN: [id number] Date of Birth: February 27, 1981

## 2021-11-11 NOTE — Patient Instructions (Signed)
Walking with blue band for mid back strengthening:   W and pull out ( band behind midback), wear your crown , chin tuck    Diagonal pull ( band behind back)   ___  Band UNDER BALLMOUNDS :  2 STEP   Minisquat:     Scoot buttocks back slight, hinge like you are looking at your reflection on a pond  Knees behind toes,  Inhale to "smell flowers"  Exhale on the rise "like rocket"  Do not lock knees, have more weight across ballmounds of feet, toes relaxed    AND THEN BICEP CURLS  20 REPS  ___  Multifidis twist  Band is on doorknob: stand further away from door (facing perpendicular)   Twisting trunk without moving the hips and knees Hold band at the level of ribcage, elbows bent,shoulder blades roll back and down like squeezing a pencil under armpit   Exhale twist,.10-15 deg away from door without moving your hips/ knees, press more weight on the side of the sitting bones/ foot opp of your direction of turn as your counterweight. Continue to maintain equal weight through legs.  Keep knee unlocked.  30 reps

## 2021-11-12 NOTE — Progress Notes (Signed)
Chief Complaint:   OBESITY Christine Adams is here to discuss her progress with her obesity treatment plan along with follow-up of her obesity related diagnoses. Christine Adams is on the Category 2 Plan with breakfast and lunch options and states she is following her eating plan approximately 50 to 75% of the time. Christine Adams states she is working with horses 60 minutes 6 times per week.  Today's visit was #: 7 Starting weight: 195 lbs Starting date: 08/04/2021 Today's weight: 177 lbs Today's date: 11/03/2021 Total lbs lost to date: 18 Total lbs lost since last in-office visit: 3  Interim History: Christine Adams has increased her activity. She has been doing PT for her back as well for 2 weeks, but she didn't follow as well for the past week.  Subjective:   1. Insulin resistance Christine Adams has had increase hunger and cravings lately.  Assessment/Plan:  No orders of the defined types were placed in this encounter.   Medications Discontinued During This Encounter  Medication Reason   Semaglutide-Weight Management 0.5 MG/0.5ML SOAJ    Semaglutide, 1 MG/DOSE, 4 MG/3ML SOPN      Meds ordered this encounter  Medications   DISCONTD: Semaglutide, 1 MG/DOSE, 4 MG/3ML SOPN    Sig: Inject 1 mg as directed once a week.    Dispense:  3 mL    Refill:  0   Semaglutide-Weight Management (WEGOVY) 1 MG/0.5ML SOAJ    Sig: Inject 1 mg into the skin once a week.    Dispense:  2 mL    Refill:  0     1. Insulin resistance Christine Adams has agreed to increase Wegovy to '1mg'$ , which is up from 0.'5mg'$ . Christine Adams will continue to work on weight loss, exercise, and decreasing simple carbohydrates to help decrease the risk of diabetes. Daris agreed to follow-up with Christine Adams as directed to closely monitor her progress.  -Semaglutide-Weight Management (WEGOVY) 1 MG/0.5ML SOAJ  2. Obesity, Current BMI 29.5 Christine Adams agrees to add additional strength training exercises.  Christine Adams is currently in the action stage of change. As such, her goal is to  continue with weight loss efforts. She has agreed to the Category 2 Plan with breakfast and lunch options.  Exercise goals:  As is.  Behavioral modification strategies: increasing lean protein intake, no skipping meals, and planning for success.  Christine Adams has agreed to follow-up with our clinic in 3 weeks. She was informed of the importance of frequent follow-up visits to maximize her success with intensive lifestyle modifications for her multiple health conditions.   Objective:   Blood pressure 130/83, pulse 66, temperature 97.8 F (36.6 C), height '5\' 5"'$  (1.651 m), weight 177 lb (80.3 kg), SpO2 98 %, currently breastfeeding. Body mass index is 29.45 kg/m.  General: Cooperative, alert, well developed, in no acute distress. HEENT: Conjunctivae and lids unremarkable. Cardiovascular: Regular rhythm.  Lungs: Normal work of breathing. Neurologic: No focal deficits.   Lab Results  Component Value Date   CREATININE 0.70 08/04/2021   BUN 13 08/04/2021   NA 140 08/04/2021   K 4.3 08/04/2021   CL 103 08/04/2021   CO2 20 08/04/2021   Lab Results  Component Value Date   ALT 24 08/04/2021   AST 22 08/04/2021   ALKPHOS 92 08/04/2021   BILITOT 0.5 08/04/2021   Lab Results  Component Value Date   HGBA1C 5.1 08/04/2021   HGBA1C 5.2 12/06/2016   Lab Results  Component Value Date   INSULIN 13.3 08/04/2021   Lab Results  Component Value  Date   TSH 2.240 08/04/2021   Lab Results  Component Value Date   CHOL 164 08/04/2021   HDL 32 (L) 08/04/2021   LDLCALC 94 08/04/2021   LDLDIRECT 105.0 02/14/2020   TRIG 225 (H) 08/04/2021   CHOLHDL 5 02/14/2020   Lab Results  Component Value Date   VD25OH 57.5 08/04/2021   VD25OH 51.25 12/09/2020   Lab Results  Component Value Date   WBC 6.6 08/04/2021   HGB 15.7 08/04/2021   HCT 47.1 (H) 08/04/2021   MCV 88 08/04/2021   PLT 283 08/04/2021   Lab Results  Component Value Date   IRON 87 12/09/2020   FERRITIN 54.9 12/09/2020    Attestation Statements:   Reviewed by clinician on day of visit: allergies, medications, problem list, medical history, surgical history, family history, social history, and previous encounter notes.  IMarcille Blanco, CMA, am acting as transcriptionist for [employer], DO  I have reviewed the above documentation for accuracy and completeness, and I agree with the above. Marjory Sneddon, D.O.  The [place] was signed into law in 2016 which includes the topic of electronic health records.  This provides immediate access to information in MyChart.  This includes consultation notes, operative notes, office notes, lab results and pathology reports.  If you have any questions about what you read please let Christine Adams know at your next visit so we can discuss your concerns and take corrective action if need be.  We are right here with you.

## 2021-11-16 ENCOUNTER — Other Ambulatory Visit: Payer: Self-pay

## 2021-11-16 ENCOUNTER — Ambulatory Visit: Payer: 59

## 2021-11-17 ENCOUNTER — Ambulatory Visit (INDEPENDENT_AMBULATORY_CARE_PROVIDER_SITE_OTHER): Payer: 59 | Admitting: Family Medicine

## 2021-11-18 ENCOUNTER — Ambulatory Visit: Payer: 59 | Admitting: Physical Therapy

## 2021-12-02 ENCOUNTER — Ambulatory Visit (INDEPENDENT_AMBULATORY_CARE_PROVIDER_SITE_OTHER): Payer: 59 | Admitting: Family Medicine

## 2021-12-02 ENCOUNTER — Encounter (INDEPENDENT_AMBULATORY_CARE_PROVIDER_SITE_OTHER): Payer: Self-pay | Admitting: Family Medicine

## 2021-12-02 ENCOUNTER — Other Ambulatory Visit: Payer: Self-pay

## 2021-12-02 VITALS — BP 138/89 | HR 67 | Temp 98.4°F | Ht 65.0 in | Wt 172.0 lb

## 2021-12-02 DIAGNOSIS — E669 Obesity, unspecified: Secondary | ICD-10-CM | POA: Diagnosis not present

## 2021-12-02 DIAGNOSIS — Z6828 Body mass index (BMI) 28.0-28.9, adult: Secondary | ICD-10-CM | POA: Diagnosis not present

## 2021-12-02 DIAGNOSIS — Z9189 Other specified personal risk factors, not elsewhere classified: Secondary | ICD-10-CM

## 2021-12-02 DIAGNOSIS — R632 Polyphagia: Secondary | ICD-10-CM | POA: Diagnosis not present

## 2021-12-02 DIAGNOSIS — E8881 Metabolic syndrome: Secondary | ICD-10-CM

## 2021-12-02 DIAGNOSIS — G8929 Other chronic pain: Secondary | ICD-10-CM

## 2021-12-02 DIAGNOSIS — M549 Dorsalgia, unspecified: Secondary | ICD-10-CM

## 2021-12-02 MED ORDER — WEGOVY 1 MG/0.5ML ~~LOC~~ SOAJ
1.0000 mg | SUBCUTANEOUS | 0 refills | Status: AC
  Filled 2021-12-02 – 2021-12-06 (×2): qty 2, 30d supply, fill #0

## 2021-12-06 ENCOUNTER — Other Ambulatory Visit: Payer: Self-pay | Admitting: Primary Care

## 2021-12-06 ENCOUNTER — Other Ambulatory Visit: Payer: Self-pay

## 2021-12-06 DIAGNOSIS — E039 Hypothyroidism, unspecified: Secondary | ICD-10-CM

## 2021-12-06 MED ORDER — LEVOTHYROXINE SODIUM 137 MCG PO TABS
ORAL_TABLET | ORAL | 0 refills | Status: DC
  Filled 2021-12-06: qty 90, 90d supply, fill #0

## 2021-12-07 ENCOUNTER — Other Ambulatory Visit: Payer: Self-pay

## 2021-12-07 DIAGNOSIS — G8929 Other chronic pain: Secondary | ICD-10-CM

## 2021-12-07 DIAGNOSIS — Z9189 Other specified personal risk factors, not elsewhere classified: Secondary | ICD-10-CM | POA: Insufficient documentation

## 2021-12-07 HISTORY — DX: Other chronic pain: G89.29

## 2021-12-08 NOTE — Progress Notes (Unsigned)
Chief Complaint:   OBESITY Christine Adams is here to discuss her progress with her obesity treatment plan along with follow-up of her obesity related diagnoses. Christine Adams is on the Category 2 Plan with breakfast and lunch options and states she is following her eating plan approximately 50% of the time. Christine Adams states she is horseback riding 20-30 minutes 4-5 times per week.  Today's visit was #: 8 Starting weight: 195 lbs Starting date: 08/04/2021 Today's weight: 172 lbs Today's date: 12/02/2021 Total lbs lost to date: 23 lbs Total lbs lost since last in-office visit: 5 lbs  Interim History: Husband had disc replacement recently with no symptoms at [facility].  Christine Adams has had more stress and work stressors.  Christine Adams has done much better than what she has reported with staying on plan.  She has been increasing her proteins.  Doing great with meditations at lunch time.    Subjective:   1. Insulin resistance w/ polyphagia Christine Adams has good control of her hunger and cravings.  Very little emotional eating lately, she has had great self control.  She denies any side effects of medications.    2. Chronic back pain, unspecified back location, unspecified back pain laterality Christine Adams is doing physical therapy still for her lumbar radiculitis.  She has some home exercises and does them faithfully.  She is slowly improving.    3. At risk for impaired metabolic function Christine Adams is at increased risk for impaired metabolic function due to current nutrition and muscle mass.    Assessment/Plan:   1. Insulin resistance w/ polyphagia Refill Wegovy at same dose 1 mg since she is doing so great. Denies any side effects.  Continue prudent nutritional plan and weight loss.   - Semaglutide-Weight Management (WEGOVY) 1 MG/0.5ML SOAJ; Inject 1 mg into the skin once a week.  Dispense: 2 mL; Refill: 0  2. Chronic back pain, unspecified back location, unspecified back pain laterality Continue to ride horses, increase  activity as tolerated to reach goal of 150 min per week, if it is okayed by physical therapy.   3. At risk for impaired metabolic function Christine Adams was given approximately 9 minutes of impaired  metabolic function prevention counseling today. We discussed intensive lifestyle modifications today with an emphasis on specific nutrition and exercise instructions and strategies.   Repetitive spaced learning was employed today to elicit superior memory formation and behavioral change.   4. Obesity, Current BMI 28.7 Christine Adams is currently in the action stage of change. As such, her goal is to continue with weight loss efforts. She has agreed to the Category 2 Plan with breakfast and lunch options.   Exercise goals: For substantial health benefits, adults should do at least 150 minutes (2 hours and 30 minutes) a week of moderate-intensity, or 75 minutes (1 hour and 15 minutes) a week of vigorous-intensity aerobic physical activity, or an equivalent combination of moderate- and vigorous-intensity aerobic activity. Aerobic activity should be performed in episodes of at least 10 minutes, and preferably, it should be spread throughout the week.  Behavioral modification strategies: meal planning and cooking strategies and planning for success.  Christine Adams has agreed to follow-up with our clinic in 5 weeks. She was informed of the importance of frequent follow-up visits to maximize her success with intensive lifestyle modifications for her multiple health conditions.   Objective:   Blood pressure 138/89, pulse 67, temperature 98.4 F (36.9 C), height '5\' 5"'$  (1.651 m), weight 172 lb (78 kg), SpO2 98 %, currently breastfeeding. Body mass  index is 28.62 kg/m.  General: Cooperative, alert, well developed, in no acute distress. HEENT: Conjunctivae and lids unremarkable. Cardiovascular: Regular rhythm.  Lungs: Normal work of breathing. Neurologic: No focal deficits.   Lab Results  Component Value Date   CREATININE  0.70 08/04/2021   BUN 13 08/04/2021   NA 140 08/04/2021   K 4.3 08/04/2021   CL 103 08/04/2021   CO2 20 08/04/2021   Lab Results  Component Value Date   ALT 24 08/04/2021   AST 22 08/04/2021   ALKPHOS 92 08/04/2021   BILITOT 0.5 08/04/2021   Lab Results  Component Value Date   HGBA1C 5.1 08/04/2021   HGBA1C 5.2 12/06/2016   Lab Results  Component Value Date   INSULIN 13.3 08/04/2021   Lab Results  Component Value Date   TSH 2.240 08/04/2021   Lab Results  Component Value Date   CHOL 164 08/04/2021   HDL 32 (L) 08/04/2021   LDLCALC 94 08/04/2021   LDLDIRECT 105.0 02/14/2020   TRIG 225 (H) 08/04/2021   CHOLHDL 5 02/14/2020   Lab Results  Component Value Date   VD25OH 57.5 08/04/2021   VD25OH 51.25 12/09/2020   Lab Results  Component Value Date   WBC 6.6 08/04/2021   HGB 15.7 08/04/2021   HCT 47.1 (H) 08/04/2021   MCV 88 08/04/2021   PLT 283 08/04/2021   Lab Results  Component Value Date   IRON 87 12/09/2020   FERRITIN 54.9 12/09/2020    Attestation Statements:   Reviewed by clinician on day of visit: allergies, medications, problem list, medical history, surgical history, family history, social history, and previous encounter notes.  I, Davy Pique, RMA, am acting as Location manager for [employer], DO.  I have reviewed the above documentation for accuracy and completeness, and I agree with the above. -  ***

## 2021-12-09 ENCOUNTER — Other Ambulatory Visit: Payer: Self-pay

## 2021-12-09 DIAGNOSIS — F4322 Adjustment disorder with anxiety: Secondary | ICD-10-CM | POA: Diagnosis not present

## 2021-12-09 DIAGNOSIS — F902 Attention-deficit hyperactivity disorder, combined type: Secondary | ICD-10-CM | POA: Diagnosis not present

## 2021-12-09 MED ORDER — SERTRALINE HCL 50 MG PO TABS
ORAL_TABLET | ORAL | 2 refills | Status: DC
  Filled 2021-12-09: qty 45, 30d supply, fill #0
  Filled 2022-02-21: qty 45, 30d supply, fill #1

## 2021-12-09 MED ORDER — AMPHETAMINE-DEXTROAMPHET ER 10 MG PO CP24
ORAL_CAPSULE | ORAL | 0 refills | Status: DC
Start: 2022-02-05 — End: 2023-01-05
  Filled 2022-06-01: qty 30, 30d supply, fill #0

## 2021-12-09 MED ORDER — TEMAZEPAM 7.5 MG PO CAPS
ORAL_CAPSULE | ORAL | 2 refills | Status: DC
  Filled 2021-12-09: qty 30, 30d supply, fill #0
  Filled 2022-01-18: qty 30, 30d supply, fill #1
  Filled 2022-02-21: qty 30, 30d supply, fill #2

## 2021-12-09 MED ORDER — AMPHETAMINE-DEXTROAMPHET ER 10 MG PO CP24
ORAL_CAPSULE | ORAL | 0 refills | Status: DC
Start: 2022-01-08 — End: 2023-01-05
  Filled 2022-02-21: qty 30, 30d supply, fill #0

## 2021-12-09 MED ORDER — AMPHETAMINE-DEXTROAMPHET ER 10 MG PO CP24
ORAL_CAPSULE | ORAL | 0 refills | Status: DC
  Filled 2021-12-09 – 2022-01-18 (×2): qty 30, 30d supply, fill #0

## 2021-12-10 ENCOUNTER — Other Ambulatory Visit: Payer: Self-pay

## 2021-12-13 ENCOUNTER — Other Ambulatory Visit (INDEPENDENT_AMBULATORY_CARE_PROVIDER_SITE_OTHER): Payer: Self-pay | Admitting: Family Medicine

## 2021-12-13 ENCOUNTER — Other Ambulatory Visit: Payer: Self-pay

## 2021-12-14 ENCOUNTER — Ambulatory Visit: Payer: 59 | Admitting: Physical Therapy

## 2021-12-14 ENCOUNTER — Other Ambulatory Visit: Payer: Self-pay

## 2021-12-15 ENCOUNTER — Telehealth (INDEPENDENT_AMBULATORY_CARE_PROVIDER_SITE_OTHER): Payer: Self-pay | Admitting: Family Medicine

## 2021-12-15 ENCOUNTER — Encounter (INDEPENDENT_AMBULATORY_CARE_PROVIDER_SITE_OTHER): Payer: Self-pay

## 2021-12-15 NOTE — Telephone Encounter (Signed)
Dr. Raliegh Scarlet - Prior authorization approved for [id number]. Effective: 12/14/2021 to 12/14/2022. Patient sent approval message via mychart.

## 2021-12-21 ENCOUNTER — Ambulatory Visit: Payer: 59 | Attending: Neurosurgery | Admitting: Physical Therapy

## 2021-12-29 ENCOUNTER — Encounter: Payer: 59 | Admitting: Physical Therapy

## 2021-12-30 ENCOUNTER — Ambulatory Visit: Payer: 59 | Attending: Neurosurgery | Admitting: Physical Therapy

## 2022-01-06 ENCOUNTER — Ambulatory Visit (INDEPENDENT_AMBULATORY_CARE_PROVIDER_SITE_OTHER): Payer: 59 | Admitting: Family Medicine

## 2022-01-06 ENCOUNTER — Encounter (INDEPENDENT_AMBULATORY_CARE_PROVIDER_SITE_OTHER): Payer: Self-pay | Admitting: Family Medicine

## 2022-01-06 VITALS — BP 118/77 | HR 78 | Temp 98.7°F | Ht 65.0 in | Wt 172.0 lb

## 2022-01-06 DIAGNOSIS — E559 Vitamin D deficiency, unspecified: Secondary | ICD-10-CM

## 2022-01-06 DIAGNOSIS — E8881 Metabolic syndrome: Secondary | ICD-10-CM | POA: Diagnosis not present

## 2022-01-06 DIAGNOSIS — Z6828 Body mass index (BMI) 28.0-28.9, adult: Secondary | ICD-10-CM | POA: Diagnosis not present

## 2022-01-06 DIAGNOSIS — Z9189 Other specified personal risk factors, not elsewhere classified: Secondary | ICD-10-CM

## 2022-01-06 DIAGNOSIS — E669 Obesity, unspecified: Secondary | ICD-10-CM | POA: Diagnosis not present

## 2022-01-06 MED ORDER — SEMAGLUTIDE-WEIGHT MANAGEMENT 0.5 MG/0.5ML ~~LOC~~ SOAJ
0.5000 mg | SUBCUTANEOUS | 0 refills | Status: DC
  Filled 2022-01-21: qty 2, 28d supply, fill #0

## 2022-01-12 ENCOUNTER — Encounter: Payer: 59 | Admitting: Primary Care

## 2022-01-15 DIAGNOSIS — E669 Obesity, unspecified: Secondary | ICD-10-CM | POA: Insufficient documentation

## 2022-01-15 DIAGNOSIS — Z9189 Other specified personal risk factors, not elsewhere classified: Secondary | ICD-10-CM | POA: Insufficient documentation

## 2022-01-15 DIAGNOSIS — E559 Vitamin D deficiency, unspecified: Secondary | ICD-10-CM | POA: Insufficient documentation

## 2022-01-15 NOTE — Progress Notes (Unsigned)
Chief Complaint:   OBESITY Christine Adams is here to discuss her progress with her obesity treatment plan along with follow-up of her obesity related diagnoses. Christine Adams is on the Category 2 Plan with breakfast and lunch options and states she is following her eating plan approximately 50% of the time. Christine Adams states she is doing cardio and strength 30 minutes 6 times per week.  Today's visit was #: 9 Starting weight: 195 lbs Starting date: 08/04/2021 Today's weight: 172 lbs Today's date: 01/06/2022 Total lbs lost to date: 23 lbs Total lbs lost since last in-office visit: 0  Interim History: Christine Adams has a husband with physical disabilities due to a back injury, which is equaling to a lot of stress.  She has not been following meal plan much.  She is happy with not gaining today.  Overall she is doing great with 23 lbs of weight loss thus far.  Her BMI is 28.6 today.   Subjective:   1. Insulin resistance Goal is HgbA1c < 5.7, fasting insulin closer to 5.   Christine Adams has been off her [facility] for 3 weeks due to medication shortage.  Prior she was taking 1 mg about a month ago.  She has had increased cravings since.   2. Vitamin D deficiency She is currently taking OTC vitamin D 3,000 IU each day. She denies nausea, vomiting or muscle weakness.  Vitamin D is at goal, last level was 57.5 about 5 months ago.   3. At risk for side effect of medication Rhythm is at risk for drug side effects due to restarting medication after being off for 3 weeks.    Assessment/Plan:  No orders of the defined types were placed in this encounter.   There are no discontinued medications.   Meds ordered this encounter  Medications   Semaglutide-Weight Management 0.5 MG/0.5ML SOAJ    Sig: Inject 0.5 mg into the skin once a week.    Dispense:  2 mL    Refill:  0     1. Insulin resistance Christine Adams and I had a long discussion and she will restart at the 0.5 mg dose of Wegovy weekly, due to lapse in treatment that  occurred.  Explained to call around to various pharmacies to ensures she gets it.  Recheck labs A1c, fasting insulin next office visit, along with FLP due to increased triglycerides and low HDL.   Refill - Semaglutide-Weight Management 0.5 MG/0.5ML SOAJ; Inject 0.5 mg into the skin once a week.  Dispense: 2 mL; Refill: 0  2. Vitamin D deficiency Low Vitamin D level contributes to fatigue and are associated with obesity, breast, and colon cancer. She agrees to continue to take prescription Vitamin D '@50'$ ,000 IU every week and will follow-up for routine testing of Vitamin D, at least 2-3 times per year to avoid over-replacement. Vitamin D level at goal, but will recheck in near future if not done by PCP.  Continue OTC Vitamin D 3,000 IU daily.    3. At risk for side effect of medication Christine Adams was given approximately 9 minutes of drug side effect counseling today.  We discussed side effect possibility and risk versus benefits. Christine Adams agreed to the medication and will contact this office if these side effects are intolerable.  Repetitive spaced learning was employed today to elicit superior memory formation and behavioral change.   4. Obesity with current BMI of 28.6 Had labs at doctors day with [facility].  Does not know where those results are.  We will  obtain fasting blood work on next office visit.  He has a complete physical exam coming up with his PCP on 01/27/2022.  Refill - Semaglutide-Weight Management 0.5 MG/0.5ML SOAJ; Inject 0.5 mg into the skin once a week.  Dispense: 2 mL; Refill: 0  Christine Adams is currently in the action stage of change. As such, her goal is to continue with weight loss efforts. She has agreed to the Category 2 Plan with breakfast and lunch options.    Exercise goals:  As is.  Behavioral modification strategies: meal planning and cooking strategies and planning for success.  Christine Adams has agreed to follow-up with our clinic in 4 weeks for fasting blood work. She was informed of  the importance of frequent follow-up visits to maximize her success with intensive lifestyle modifications for her multiple health conditions.   Objective:   Blood pressure 118/77, pulse 78, temperature 98.7 F (37.1 C), height '5\' 5"'$  (1.651 m), weight 172 lb (78 kg), SpO2 99 %, currently breastfeeding. Body mass index is 28.62 kg/m.  General: Cooperative, alert, well developed, in no acute distress. HEENT: Conjunctivae and lids unremarkable. Cardiovascular: Regular rhythm.  Lungs: Normal work of breathing. Neurologic: No focal deficits.   Lab Results  Component Value Date   CREATININE 0.70 08/04/2021   BUN 13 08/04/2021   NA 140 08/04/2021   K 4.3 08/04/2021   CL 103 08/04/2021   CO2 20 08/04/2021   Lab Results  Component Value Date   ALT 24 08/04/2021   AST 22 08/04/2021   ALKPHOS 92 08/04/2021   BILITOT 0.5 08/04/2021   Lab Results  Component Value Date   HGBA1C 5.1 08/04/2021   HGBA1C 5.2 12/06/2016   Lab Results  Component Value Date   INSULIN 13.3 08/04/2021   Lab Results  Component Value Date   TSH 2.240 08/04/2021   Lab Results  Component Value Date   CHOL 164 08/04/2021   HDL 32 (L) 08/04/2021   LDLCALC 94 08/04/2021   LDLDIRECT 105.0 02/14/2020   TRIG 225 (H) 08/04/2021   CHOLHDL 5 02/14/2020   Lab Results  Component Value Date   VD25OH 57.5 08/04/2021   VD25OH 51.25 12/09/2020   Lab Results  Component Value Date   WBC 6.6 08/04/2021   HGB 15.7 08/04/2021   HCT 47.1 (H) 08/04/2021   MCV 88 08/04/2021   PLT 283 08/04/2021   Lab Results  Component Value Date   IRON 87 12/09/2020   FERRITIN 54.9 12/09/2020   Attestation Statements:   Reviewed by clinician on day of visit: allergies, medications, problem list, medical history, surgical history, family history, social history, and previous encounter notes.  I, Davy Pique, RMA, am acting as Location manager for [employer], DO.   I have reviewed the above documentation for  accuracy and completeness, and I agree with the above. Marjory Sneddon, D.O.  The [place] was signed into law in 2016 which includes the topic of electronic health records.  This provides immediate access to information in MyChart.  This includes consultation notes, operative notes, office notes, lab results and pathology reports.  If you have any questions about what you read please let us know at your next visit so we can discuss your concerns and take corrective action if need be.  We are right here with you.

## 2022-01-18 ENCOUNTER — Other Ambulatory Visit: Payer: Self-pay

## 2022-01-21 ENCOUNTER — Encounter: Payer: Self-pay | Admitting: Pharmacist

## 2022-01-21 ENCOUNTER — Other Ambulatory Visit: Payer: Self-pay

## 2022-01-27 ENCOUNTER — Ambulatory Visit (INDEPENDENT_AMBULATORY_CARE_PROVIDER_SITE_OTHER): Payer: 59 | Admitting: Primary Care

## 2022-01-27 ENCOUNTER — Encounter: Payer: Self-pay | Admitting: Primary Care

## 2022-01-27 ENCOUNTER — Ambulatory Visit (INDEPENDENT_AMBULATORY_CARE_PROVIDER_SITE_OTHER)
Admission: RE | Admit: 2022-01-27 | Discharge: 2022-01-27 | Disposition: A | Discharge status: Home / Self Care | Payer: 59 | Source: Ambulatory Visit | Attending: Primary Care | Admitting: Primary Care

## 2022-01-27 VITALS — BP 129/72 | HR 76 | Temp 98.7°F | Ht 65.0 in | Wt 177.0 lb

## 2022-01-27 DIAGNOSIS — M25542 Pain in joints of left hand: Secondary | ICD-10-CM | POA: Diagnosis not present

## 2022-01-27 DIAGNOSIS — M2550 Pain in unspecified joint: Secondary | ICD-10-CM | POA: Insufficient documentation

## 2022-01-27 DIAGNOSIS — Z1231 Encounter for screening mammogram for malignant neoplasm of breast: Secondary | ICD-10-CM

## 2022-01-27 DIAGNOSIS — Z0001 Encounter for general adult medical examination with abnormal findings: Secondary | ICD-10-CM | POA: Diagnosis not present

## 2022-01-27 DIAGNOSIS — F909 Attention-deficit hyperactivity disorder, unspecified type: Secondary | ICD-10-CM | POA: Diagnosis not present

## 2022-01-27 DIAGNOSIS — F419 Anxiety disorder, unspecified: Secondary | ICD-10-CM

## 2022-01-27 DIAGNOSIS — E039 Hypothyroidism, unspecified: Secondary | ICD-10-CM | POA: Diagnosis not present

## 2022-01-27 DIAGNOSIS — M7989 Other specified soft tissue disorders: Secondary | ICD-10-CM | POA: Diagnosis not present

## 2022-01-27 HISTORY — DX: Pain in unspecified joint: M25.50

## 2022-01-27 NOTE — Assessment & Plan Note (Signed)
Controlled.  Following with psychiatry. Continue Adderall XL 10 mg daily.

## 2022-01-27 NOTE — Assessment & Plan Note (Addendum)
Controlled.   Continue sertraline 75 mg daily. Continue Temazepam 7.5 mg daily Following with psychiatry.

## 2022-01-27 NOTE — Assessment & Plan Note (Addendum)
She is taking levothyroxine correctly. Continue levothyroxine 137 mcg.   Repeat TSH pending, [facility]

## 2022-01-27 NOTE — Assessment & Plan Note (Signed)
Immunizations UTD. Pap smear UTD. Mammogram due in 2 weeks, orders placed.  Discussed the importance of a healthy diet and regular exercise in order for weight loss, and to reduce the risk of further co-morbidity.  Exam stable. Labs pending.  Follow up in 1 year for repeat physical.

## 2022-01-27 NOTE — Patient Instructions (Signed)
Complete labs at [facility] [place]. Make sure they draw TSH.  Complete xray(s) prior to leaving today. I will notify you of your results once received.  [facility] to [facility] your [facility].   It was a pleasure to see you today!  Preventive Care 107-40 Years Old, Female Preventive care refers to lifestyle choices and visits with your health care provider that can promote health and wellness. Preventive care visits are also called wellness exams. What can I expect for my preventive care visit? Counseling During your preventive care visit, your health care provider may ask about your: Medical history, including: Past medical problems. Family medical history. Pregnancy history. Current health, including: Menstrual cycle. Method of birth control. Emotional well-being. Home life and relationship well-being. Sexual activity and sexual health. Lifestyle, including: Alcohol, nicotine or tobacco, and drug use. Access to firearms. Diet, exercise, and sleep habits. Work and work Statistician. Sunscreen use. Safety issues such as seatbelt and bike helmet use. Physical exam Your health care provider may check your: Height and weight. These may be used to calculate your BMI (body mass index). BMI is a measurement that tells if you are at a healthy weight. Waist circumference. This measures the distance around your waistline. This measurement also tells if you are at a healthy weight and may help predict your risk of certain diseases, such as type 2 diabetes and high blood pressure. Heart rate and blood pressure. Body temperature. Skin for abnormal spots. What immunizations do I need?  Vaccines are usually given at various ages, according to a schedule. Your health care provider will recommend vaccines for you based on your age, medical history, and lifestyle or other factors, such as travel or where you work. What tests do I need? Screening Your health care  provider may recommend screening tests for certain conditions. This may include: Pelvic exam and Pap test. Lipid and cholesterol levels. Diabetes screening. This is done by checking your blood sugar (glucose) after you have not eaten for a while (fasting). Hepatitis B test. Hepatitis C test. HIV (human immunodeficiency virus) test. STI (sexually transmitted infection) testing, if you are at risk. BRCA-related cancer screening. This may be done if you have a family history of breast, ovarian, tubal, or peritoneal cancers. Talk with your health care provider about your test results, treatment options, and if necessary, the need for more tests. Follow these instructions at home: Eating and drinking  Eat a healthy diet that includes fresh fruits and vegetables, whole grains, lean protein, and low-fat dairy products. Take vitamin and mineral supplements as recommended by your health care provider. Do not drink alcohol if: Your health care provider tells you not to drink. You are pregnant, may be pregnant, or are planning to become pregnant. If you drink alcohol: Limit how much you have to 0-1 drink a day. Know how much alcohol is in your drink. In the [place] one drink equals one 12 oz bottle of beer (355 mL), one 5 oz glass of wine (148 mL), or one 1 oz glass of hard liquor (44 mL). Lifestyle Brush your teeth every morning and night with fluoride toothpaste. Floss one time each day. Exercise for at least 30 minutes 5 or more days each week. Do not use any products that contain nicotine or tobacco. These products include cigarettes, chewing tobacco, and vaping devices, such as e-cigarettes. If you need help quitting, ask your health care provider. Do not use drugs. If you are sexually active, practice safe sex.  Use a condom or other form of protection to prevent STIs. If you do not wish to become pregnant, use a form of birth control. If you plan to become pregnant, see your health care provider  for a prepregnancy visit. Find healthy ways to manage stress, such as: Meditation, yoga, or listening to music. Journaling. Talking to a trusted person. Spending time with friends and family. Minimize exposure to UV radiation to reduce your risk of skin cancer. Safety Always wear your seat belt while driving or riding in a vehicle. Do not drive: If you have been drinking alcohol. Do not ride with someone who has been drinking. If you have been using any mind-altering substances or drugs. While texting. When you are tired or distracted. Wear a helmet and other protective equipment during sports activities. If you have firearms in your house, make sure you follow all gun safety procedures. Seek help if you have been physically or sexually abused. What's next? Go to your health care provider once a year for an annual wellness visit. Ask your health care provider how often you should have your eyes and teeth checked. Stay up to date on all vaccines. This information is not intended to replace advice given to you by your health care provider. Make sure you discuss any questions you have with your health care provider. Document Revised: 12/09/2020 Document Reviewed: 12/09/2020 Elsevier Patient Education  [place].

## 2022-01-27 NOTE — Progress Notes (Signed)
Subjective:    Patient ID: Christine Adams, female    DOB: July 21, 1980, 41 y.o.   MRN: [id number]  HPI  Christine Adams is a very pleasant 41 y.o. female who presents today for complete physical and follow up of chronic conditions.  She would also like to mention chronic finger pain, swelling, and reduced ROM. Chronic to the left 5th digit medially at the PIP joint for the last 3 months. She notices there symptoms when picking up objects or with other fine motor skills. She denies injury/trauma.   Immunizations: -Tetanus: 2021 -Influenza: Completed this season -Covid-19: 4 vaccines   Diet: Fair diet.  Exercise: No regular exercise.  Eye exam: Completes annually  Dental exam: Completes semi-annually   Pap Smear: Completed per GYN.   BP Readings from Last 3 Encounters:  01/27/22 129/72  01/06/22 118/77  12/02/21 138/89       Review of Systems  Constitutional:  Negative for unexpected weight change.  HENT:  Negative for rhinorrhea.   Respiratory:  Negative for cough and shortness of breath.   Cardiovascular:  Negative for chest pain.  Gastrointestinal:  Negative for constipation and diarrhea.  Genitourinary:  Negative for difficulty urinating.  Musculoskeletal:  Positive for arthralgias and joint swelling.  Skin:  Negative for rash.  Allergic/Immunologic: Negative for environmental allergies.  Neurological:  Negative for dizziness and headaches.  Psychiatric/Behavioral:  The patient is not nervous/anxious.          Past Medical History:  Diagnosis Date   ADHD    Asthma    Back pain    BMI 30.0-30.9,adult    Hypothyroidism    Age 67 yrs   Other fatigue    Postpartum anxiety- ongoing    Postpartum depression    Rosacea    Shortness of breath on exertion    Sleep walking disorder 01/22/2015   Vitamin D deficiency     Social History   Socioeconomic History   Marital status: Married    Spouse name: Ryan   Number of children: 1   Years of education: Not on  file   Highest education level: Not on file  Occupational History   Occupation: Event organiser: [employer]  Tobacco Use   Smoking status: Never   Smokeless tobacco: Never  Vaping Use   Vaping Use: Never used  Substance and Sexual Activity   Alcohol use: Yes    Comment: occasional   Drug use: Never   Sexual activity: Yes    Partners: Male  Other Topics Concern   Not on file  Social History Narrative   Married since 2018.Lives with husband and son.   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   Social Determinants of Health   Financial Resource Strain: Not on file  Food Insecurity: Not on file  Transportation Needs: Not on file  Physical Activity: Not on file  Stress: Not on file  Social Connections: Not on file  Intimate Partner Violence: Not on file    Past Surgical History:  Procedure Laterality Date   WISDOM TOOTH EXTRACTION      Family History  Problem Relation Age of Onset   Clotting disorder Mother    Obesity Father    Heart disease Father    Sudden Cardiac Death Father    Hypertension Father    Hyperlipidemia Father    ADD / ADHD Sister    Obesity Sister    Diabetes Maternal Grandmother    Mesothelioma Maternal Grandmother  Alcohol abuse Paternal Grandmother    Hypertension Paternal Grandfather     Allergies  Allergen Reactions   Molds & Smuts Itching    Dust mites, mold airborne     Current Outpatient Medications on File Prior to Visit  Medication Sig Dispense Refill   amphetamine-dextroamphetamine (ADDERALL XR) 10 MG 24 hr capsule Take 1 capsule by mouth every morning for ADHD 30 capsule 0   [START ON 02/05/2022] amphetamine-dextroamphetamine (ADDERALL XR) 10 MG 24 hr capsule Take 1 capsule by mouth every morning for ADHD 30 capsule 0   amphetamine-dextroamphetamine (ADDERALL XR) 10 MG 24 hr capsule Take 1 capsule by mouth every morning for ADHD 30 capsule 0   azelaic acid (AZELEX) 20 % cream  Apply 1 application topically 2 (two) times daily. After skin is thoroughly washed and patted dry, gently but thoroughly massage a thin film of azelaic acid cream into the affected area twice daily, in the morning and evening.     cholecalciferol (VITAMIN D) 1000 units tablet Take 3,000 Units by mouth daily.     levothyroxine (SYNTHROID) 137 MCG tablet Take 1 tablet by mouth every morning on an empty stomach with water only.  No food or other medications for 30 minutes. Office visit required for further refills. 90 tablet 0   Prenatal Vit-Fe Fumarate-FA (PRENATAL MULTIVITAMIN) TABS tablet Take 1 tablet by mouth daily at 12 noon.     Semaglutide-Weight Management 0.5 MG/0.5ML SOAJ Inject 0.5 mg into the skin once a week. 2 mL 0   sertraline (ZOLOFT) 50 MG tablet Take  1.5 tablets daily for anxiety/OCD (Patient taking differently: Take 50 mg by mouth daily.) 45 tablet 2   sertraline (ZOLOFT) 50 MG tablet Take  1.5 tablets daily for anxiety/OCD 45 tablet 2   temazepam (RESTORIL) 7.5 MG capsule Take 1 capsule  by mouth at bedtime as needed for sleep 30 capsule 2   valACYclovir (VALTREX) 500 MG tablet Take by mouth as needed.     No current facility-administered medications on file prior to visit.    BP 129/72   Pulse 76   Temp 98.7 F (37.1 C) (Oral)   Ht '5\' 5"'$  (1.651 m)   Wt 177 lb (80.3 kg)   SpO2 99%   BMI 29.45 kg/m  Objective:   Physical Exam HENT:     Right Ear: Tympanic membrane and ear canal normal.     Left Ear: Tympanic membrane and ear canal normal.     Nose: Nose normal.  Eyes:     Conjunctiva/sclera: Conjunctivae normal.     Pupils: Pupils are equal, round, and reactive to light.  Neck:     Thyroid: No thyromegaly.  Cardiovascular:     Rate and Rhythm: Normal rate and regular rhythm.     Heart sounds: No murmur heard. Pulmonary:     Effort: Pulmonary effort is normal.     Breath sounds: Normal breath sounds. No rales.  Abdominal:     General: Bowel sounds are normal.      Palpations: Abdomen is soft.     Tenderness: There is no abdominal tenderness.  Musculoskeletal:     Left hand: No swelling, tenderness or bony tenderness. Decreased range of motion. Decreased strength.     Cervical back: Neck supple.     Comments: Decreased strength of left 5th digit with flexion  Lymphadenopathy:     Cervical: No cervical adenopathy.  Skin:    General: Skin is warm and dry.     Findings:  No rash.  Neurological:     Mental Status: She is alert and oriented to person, place, and time.     Cranial Nerves: No cranial nerve deficit.     Deep Tendon Reflexes: Reflexes are normal and symmetric.  Psychiatric:        Mood and Affect: Mood normal.           Assessment & Plan:   Problem List Items Addressed This Visit       Endocrine   Hypothyroidism    She is taking levothyroxine correctly. Continue levothyroxine 137 mcg.   Repeat TSH pending, will [facility] few weeks        Other   Attention deficit hyperactivity disorder (ADHD)    Controlled.  Following with psychiatry. Continue Adderall XL 10 mg daily.      Encounter for annual general medical examination with abnormal findings in adult - Primary    Immunizations UTD. Pap smear UTD. Mammogram due in 2 weeks, orders placed.  Discussed the importance of a healthy diet and regular exercise in order for weight loss, and to reduce the risk of further co-morbidity.  Exam stable. Labs pending.  Follow up in 1 year for repeat physical.       Anxiety    Controlled.   Continue sertraline 75 mg daily. Continue Temazepam 7.5 mg daily Following with psychiatry.       Joint pain    To left 5th digit PIP joint.  Checking xrays today. Discussed splinting, rest.  Consider orthopedic referral.      Relevant Orders   DG Hand Complete Left   Other Visit Diagnoses     Encounter for screening mammogram for malignant neoplasm of breast        Relevant Orders   MM 3D SCREEN BREAST BILATERAL          Pleas Koch, NP

## 2022-01-27 NOTE — Assessment & Plan Note (Signed)
To left 5th digit PIP joint.  Checking xrays today. Discussed splinting, rest.  Consider orthopedic referral.

## 2022-01-28 ENCOUNTER — Encounter: Payer: Self-pay | Admitting: Primary Care

## 2022-01-31 DIAGNOSIS — M7989 Other specified soft tissue disorders: Secondary | ICD-10-CM | POA: Diagnosis not present

## 2022-01-31 DIAGNOSIS — S92355A Nondisplaced fracture of fifth metatarsal bone, left foot, initial encounter for closed fracture: Secondary | ICD-10-CM | POA: Diagnosis not present

## 2022-01-31 DIAGNOSIS — S9782XA Crushing injury of left foot, initial encounter: Secondary | ICD-10-CM | POA: Diagnosis not present

## 2022-01-31 DIAGNOSIS — M79672 Pain in left foot: Secondary | ICD-10-CM | POA: Diagnosis not present

## 2022-02-01 DIAGNOSIS — M7989 Other specified soft tissue disorders: Secondary | ICD-10-CM | POA: Diagnosis not present

## 2022-02-01 DIAGNOSIS — M79672 Pain in left foot: Secondary | ICD-10-CM | POA: Diagnosis not present

## 2022-02-02 ENCOUNTER — Encounter (INDEPENDENT_AMBULATORY_CARE_PROVIDER_SITE_OTHER): Payer: Self-pay

## 2022-02-09 DIAGNOSIS — M79672 Pain in left foot: Secondary | ICD-10-CM | POA: Diagnosis not present

## 2022-02-10 ENCOUNTER — Telehealth: Payer: Self-pay | Admitting: Primary Care

## 2022-02-10 NOTE — Telephone Encounter (Signed)
Just FYI: [facility] records have been received. Thank you!

## 2022-02-14 ENCOUNTER — Ambulatory Visit (INDEPENDENT_AMBULATORY_CARE_PROVIDER_SITE_OTHER): Payer: 59 | Admitting: Family Medicine

## 2022-02-19 IMAGING — MR MR LUMBAR SPINE W/O CM
5 series · 31 of 48 positions shown · non-contrast
Comparison: None.

CLINICAL DATA: Low back pain

EXAM:
MRI LUMBAR SPINE WITHOUT CONTRAST
TECHNIQUE: Multiplanar, multisequence MR imaging of the lumbar spine was
performed. No intravenous contrast was administered.

[Series 5: T2 · sagittal · 4.0mm · 0.81mm/px · 6 of 17 slices shown (1 of 2)]
[im 1/17]
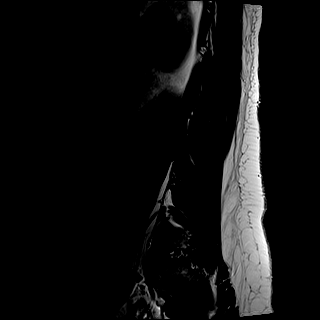
[im 4/17]
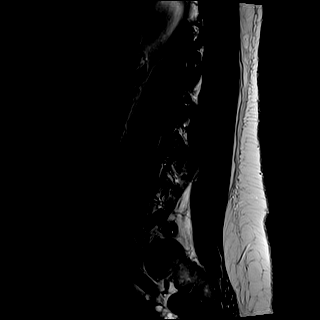
[im 7/17]
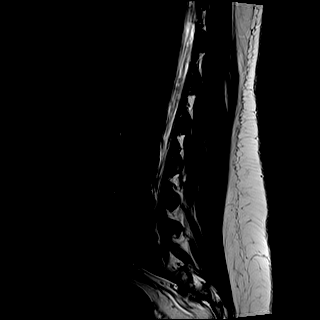
[im 10/17]
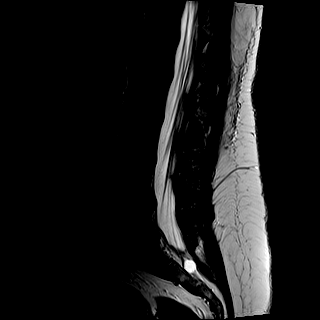
[im 13/17]
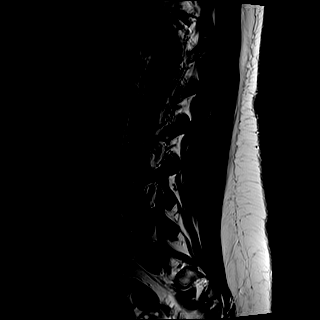
[im 17/17]
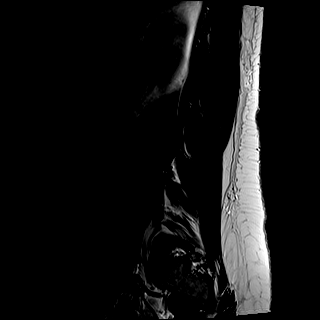

[Series 6: T1 · sagittal · 4.0mm · 0.81mm/px · 7 of 17 slices shown (1 of 2)]
[im 1/17]
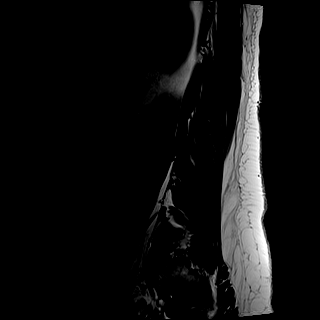
[im 3/17]
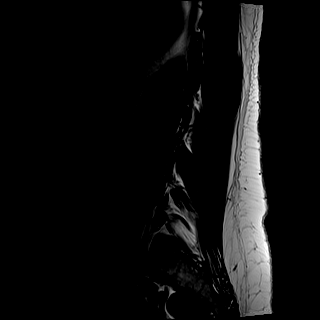
[im 6/17]
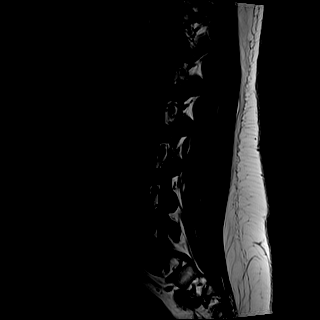
[im 9/17]
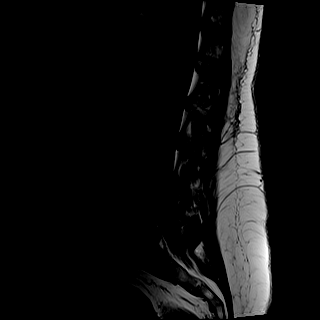
[im 11/17]
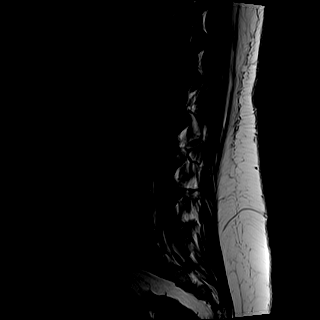
[im 14/17]
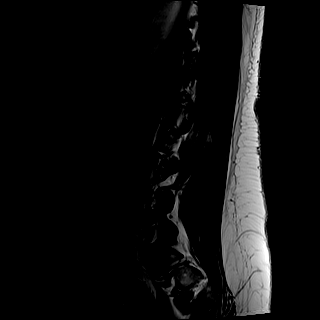
[im 17/17]
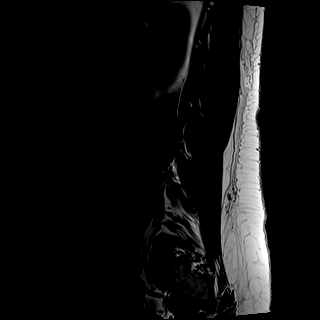

[Series 7: STIR · sagittal · 4.0mm · 0.41mm/px · 2 of 17 slices shown]
[im 1/17]
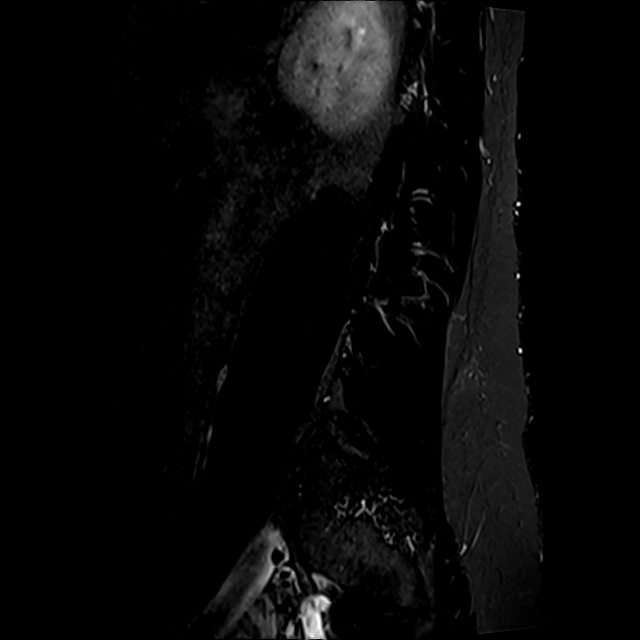
[im 3/17]
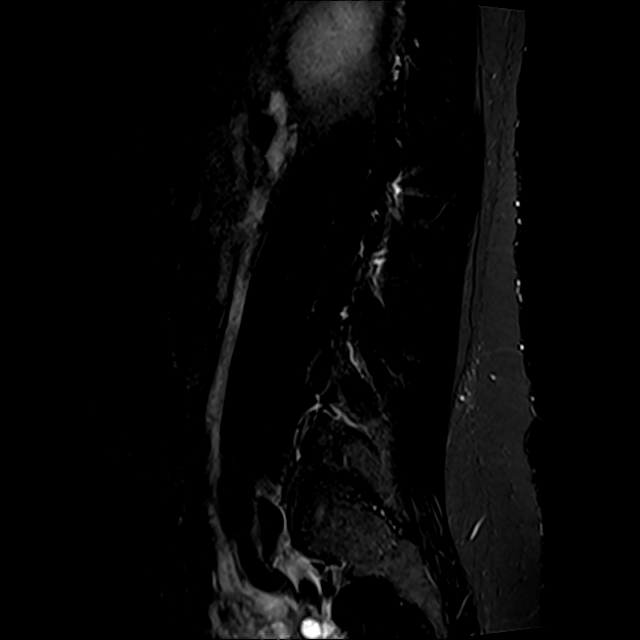

[Series 8: T2 · axial · 4.0mm · 0.78mm/px · z∈[-78,+131]mm · 8 of 36 slices shown (2 of 2)]
[im 1/36]
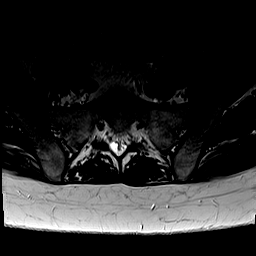
[im 6/36]
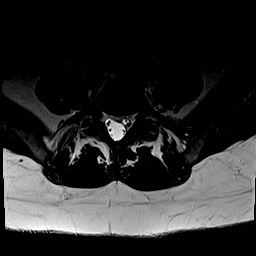
[im 11/36]
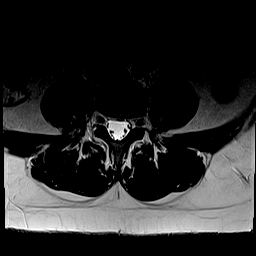
[im 17/36]
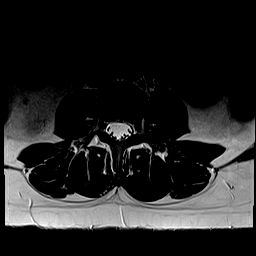
[im 19/36]
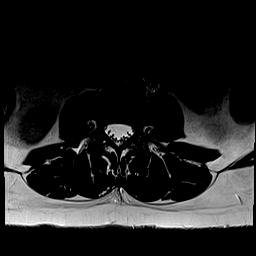
[im 25/36]
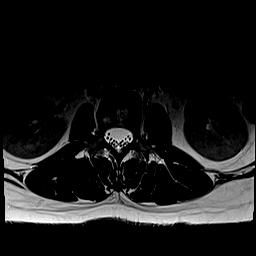
[im 30/36]
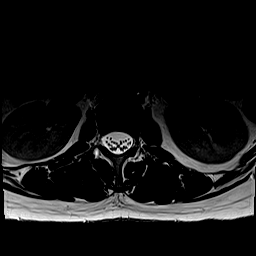
[im 36/36]
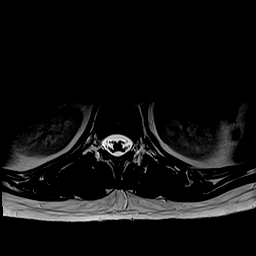

[Series 9: T1 · axial · 4.0mm · 0.39mm/px · z∈[-78,+131]mm · 8 of 36 slices shown (2 of 2)]
[im 1/36]
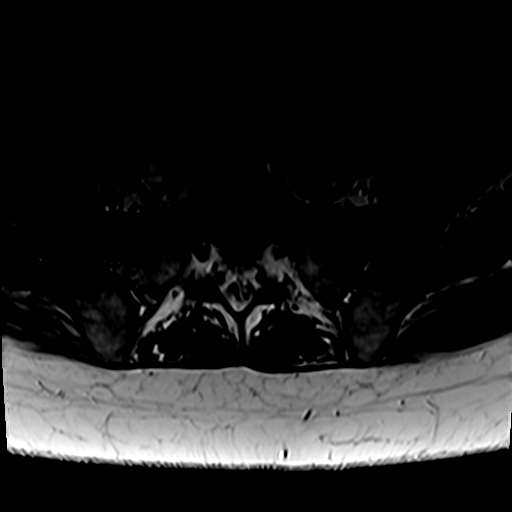
[im 6/36]
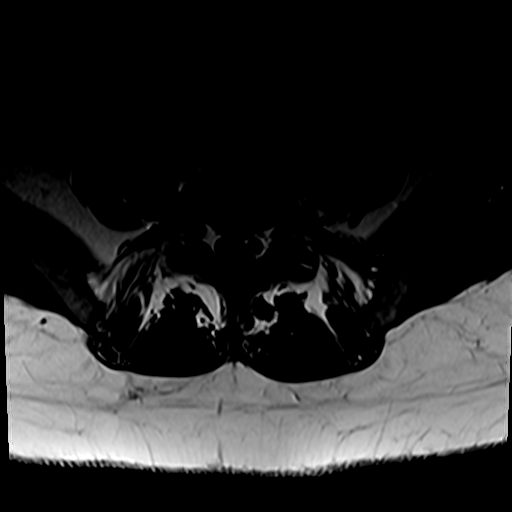
[im 11/36]
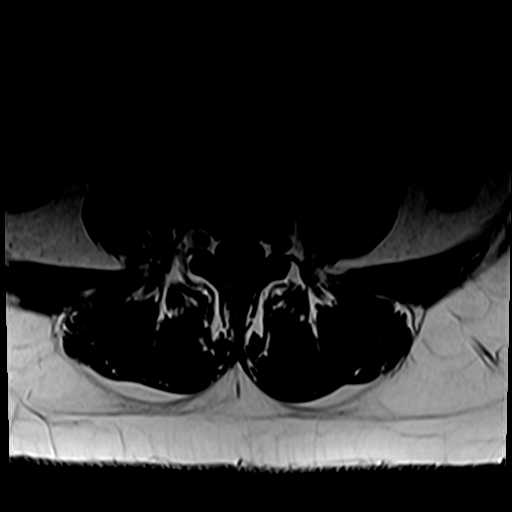
[im 17/36]
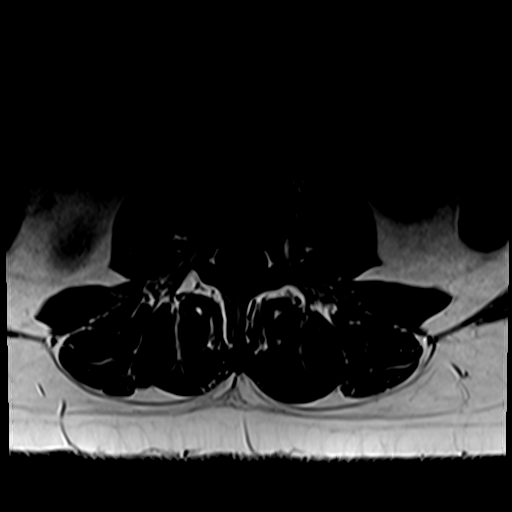
[im 19/36]
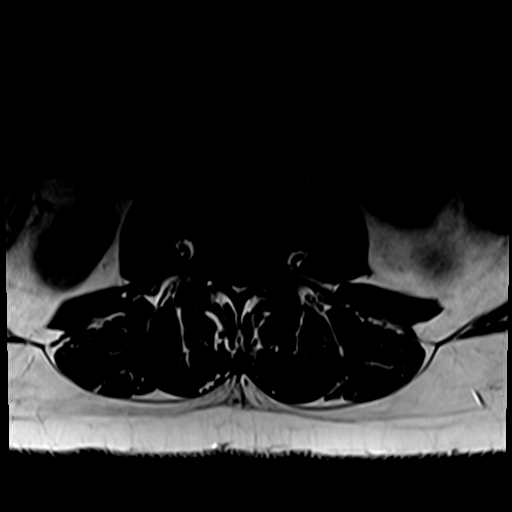
[im 25/36]
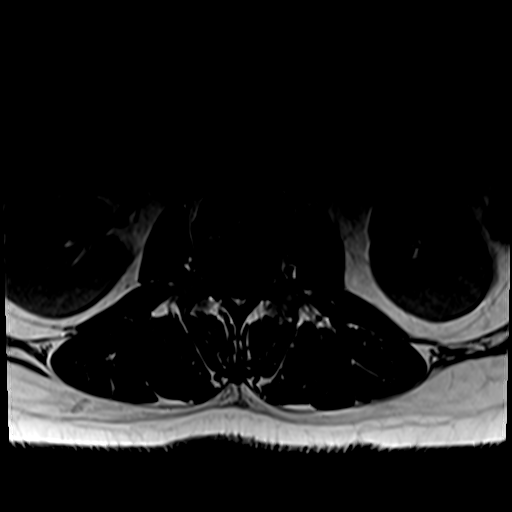
[im 30/36]
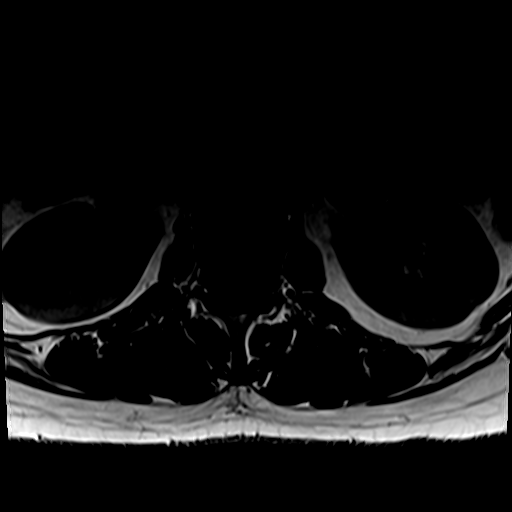
[im 36/36]
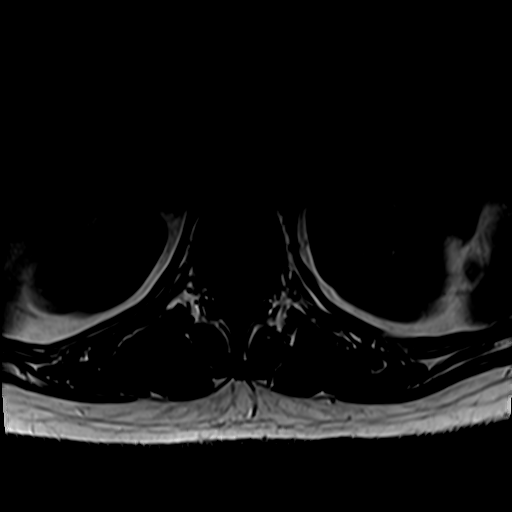

[31 of 48 positions shown; findings below may reference images not displayed]

FINDINGS: Segmentation:  Standard.

Alignment: Mild straightening of the normal lumbar lordosis. No
listhesis.

Vertebrae:  No acute fracture or suspicious osseous lesion.

Conus medullaris and cauda equina: Conus extends to the L1 level.
Conus and cauda equina appear normal. Incidental note is made of a
Tarlov cyst posterior to the S2 vertebral body, incompletely imaged.

Paraspinal and other soft tissues: Negative.

Disc levels:

T12-L1: No significant disc bulge. No spinal canal stenosis or
neural foraminal narrowing.

L1-L2: No significant disc bulge. No spinal canal stenosis or neural
foraminal narrowing.

L2-L3: No significant disc bulge. No spinal canal stenosis or neural
foraminal narrowing.

L3-L4: No significant disc bulge. Mild facet arthropathy. No spinal
canal stenosis or neural foraminal narrowing.

L4-L5: Disc desiccation and mild disc bulge with superimposed
central protrusion. Mild facet arthropathy. No spinal canal stenosis
or neural foraminal narrowing.

L5-S1: Disc desiccation and broad-based disc bulge with superimposed
central protrusion. No spinal canal stenosis or neural foraminal
narrowing.
IMPRESSION: 1. Mild degenerative changes, with disc desiccation and mild disc
bulges at L4-L5 and L5-S1.
2. No spinal canal stenosis or neural foraminal narrowing.

## 2022-02-21 ENCOUNTER — Other Ambulatory Visit: Payer: Self-pay

## 2022-02-22 ENCOUNTER — Other Ambulatory Visit: Payer: Self-pay

## 2022-02-22 ENCOUNTER — Encounter (INDEPENDENT_AMBULATORY_CARE_PROVIDER_SITE_OTHER): Payer: Self-pay | Admitting: Family Medicine

## 2022-02-22 ENCOUNTER — Ambulatory Visit (INDEPENDENT_AMBULATORY_CARE_PROVIDER_SITE_OTHER): Payer: 59 | Admitting: Family Medicine

## 2022-02-22 VITALS — BP 130/84 | HR 61 | Temp 98.0°F | Ht 65.0 in | Wt 172.0 lb

## 2022-02-22 DIAGNOSIS — E669 Obesity, unspecified: Secondary | ICD-10-CM

## 2022-02-22 DIAGNOSIS — Z6828 Body mass index (BMI) 28.0-28.9, adult: Secondary | ICD-10-CM | POA: Diagnosis not present

## 2022-02-22 DIAGNOSIS — E559 Vitamin D deficiency, unspecified: Secondary | ICD-10-CM | POA: Diagnosis not present

## 2022-02-22 DIAGNOSIS — E038 Other specified hypothyroidism: Secondary | ICD-10-CM

## 2022-02-22 DIAGNOSIS — E039 Hypothyroidism, unspecified: Secondary | ICD-10-CM | POA: Diagnosis not present

## 2022-02-22 LAB — TSH: TSH: 2.14 (ref 0.41–5.90)

## 2022-02-22 LAB — VITAMIN D 25 HYDROXY (VIT D DEFICIENCY, FRACTURES): Vit D, 25-Hydroxy: 50.3

## 2022-02-22 MED ORDER — SEMAGLUTIDE-WEIGHT MANAGEMENT 0.5 MG/0.5ML ~~LOC~~ SOAJ
0.5000 mg | SUBCUTANEOUS | 0 refills | Status: DC
  Filled 2022-02-22: qty 2, 28d supply, fill #0

## 2022-02-23 ENCOUNTER — Encounter: Payer: Self-pay | Admitting: Primary Care

## 2022-02-23 LAB — T4, FREE: Free T4: 1.41 ng/dL (ref 0.82–1.77)

## 2022-02-23 LAB — VITAMIN D 25 HYDROXY (VIT D DEFICIENCY, FRACTURES): Vit D, 25-Hydroxy: 50.3 ng/mL (ref 30.0–100.0)

## 2022-02-23 LAB — T3: T3, Total: 100 ng/dL (ref 71–180)

## 2022-02-23 LAB — TSH: TSH: 2.14 u[IU]/mL (ref 0.450–4.500)

## 2022-02-23 NOTE — Telephone Encounter (Signed)
Printed labs. Will abstract and send hard copy to Anda Kraft to review.  No further action needed at this time.

## 2022-03-02 NOTE — Progress Notes (Signed)
Chief Complaint:   OBESITY Christine Adams is here to discuss her progress with her obesity treatment plan along with follow-up of her obesity related diagnoses. Christine Adams is on the Category 2 Plan with breakfast and lunch options and states she is following her eating plan approximately 50% of the time. Christine Adams states she is horseback riding 30 minutes 5-6 times per week.  Today's visit was #: 10 Starting weight: 195 lbs Starting date: 08/04/2021 Today's weight: 172 lbs Today's date: 03/25/2022 Total lbs lost to date: 23 Total lbs lost since last in-office visit: 0  Interim History: Christine Adams is here for a follow up office visit.  We reviewed her meal plan and all questions were answered.  Patient's food recall appears to be accurate and consistent with what is on plan when she is following it.   When eating on plan, her hunger and cravings are well controlled.   Christine Adams is here for labs but is not fasting, thus we will have to obtain fasting insulin and fasting lipid panel in the future.  Subjective:   1. Vitamin D deficiency She is currently taking OTC vitamin D 3000 IU each day. She denies nausea, vomiting or muscle weakness.  2. Other specified hypothyroidism Christine Adams is asymptomatic and without concerns. Medication: Synthroid 137 mcg  Assessment/Plan:   Orders Placed This Encounter  Procedures   VITAMIN D 25 Hydroxy (Vit-D Deficiency, Fractures)   TSH   T4, free   T3    Medications Discontinued During This Encounter  Medication Reason   Semaglutide-Weight Management 0.5 MG/0.5ML SOAJ Reorder     Meds ordered this encounter  Medications   Semaglutide-Weight Management 0.5 MG/0.5ML SOAJ    Sig: Inject 0.5 mg into the skin once a week.    Dispense:  2 mL    Refill:  0     1. Vitamin D deficiency Low Vitamin D level contributes to fatigue and are associated with obesity, breast, and colon cancer. She agrees to continue to take prescription Vitamin D 3000 IU daily and will  follow-up for routine testing of Vitamin D, at least 2-3 times per year to avoid over-replacement.  Lab/Orders today: - VITAMIN D 25 Hydroxy (Vit-D Deficiency, Fractures)  2. Other specified hypothyroidism Patient with long-standing hypothyroidism, on levothyroxine therapy. She appears euthyroid. Orders and follow up as documented in patient record.  Counseling Good thyroid control is important for overall health. Supratherapeutic thyroid levels are dangerous and will not improve weight loss results. The correct way to take levothyroxine is fasting, with water, separated by at least 30 minutes from breakfast, and separated by more than 4 hours from calcium, iron, multivitamins, acid reflux medications (PPIs).   Lab/Orders today: - TSH - T4, free - T3  3. Obesity with current BMI of 28.7 Christine Adams is currently in the action stage of change. As such, her goal is to continue with weight loss efforts. She has agreed to the Category 2 Plan with breakfast and lunch options.   We discussed various medication options to help Christine Adams with her weight loss efforts and we both agreed to continue [facility] as per below. Refill- Semaglutide-Weight Management 0.5 MG/0.5ML SOAJ; Inject 0.5 mg into the skin once a week.  Dispense: 2 mL; Refill: 0  Exercise goals:  As is  Behavioral modification strategies: planning for success.  Christine Adams has agreed to follow-up with our clinic in 4 weeks. She was informed of the importance of frequent follow-up visits to maximize her success with intensive lifestyle modifications  for her multiple health conditions.   Objective:   Blood pressure 130/84, pulse 61, temperature 98 F (36.7 C), height '5\' 5"'$  (1.651 m), weight 172 lb (78 kg), last menstrual period 01/25/2022, SpO2 96 %, currently breastfeeding. Body mass index is 28.62 kg/m.  General: Cooperative, alert, well developed, in no acute distress. HEENT: Conjunctivae and lids unremarkable. Cardiovascular: Regular  rhythm.  Lungs: Normal work of breathing. Neurologic: No focal deficits.   Lab Results  Component Value Date   CREATININE 0.70 08/04/2021   BUN 13 08/04/2021   NA 140 08/04/2021   K 4.3 08/04/2021   CL 103 08/04/2021   CO2 20 08/04/2021   Lab Results  Component Value Date   ALT 24 08/04/2021   AST 22 08/04/2021   ALKPHOS 92 08/04/2021   BILITOT 0.5 08/04/2021   Lab Results  Component Value Date   HGBA1C 5.1 08/04/2021   HGBA1C 5.2 12/06/2016   Lab Results  Component Value Date   INSULIN 13.3 08/04/2021   Lab Results  Component Value Date   TSH 2.140 02/22/2022   Lab Results  Component Value Date   CHOL 164 08/04/2021   HDL 32 (L) 08/04/2021   LDLCALC 94 08/04/2021   LDLDIRECT 105.0 02/14/2020   TRIG 225 (H) 08/04/2021   CHOLHDL 5 02/14/2020   Lab Results  Component Value Date   VD25OH 50.3 02/22/2022   VD25OH 50.3 02/22/2022   VD25OH 57.5 08/04/2021   Lab Results  Component Value Date   WBC 6.6 08/04/2021   HGB 15.7 08/04/2021   HCT 47.1 (H) 08/04/2021   MCV 88 08/04/2021   PLT 283 08/04/2021   Lab Results  Component Value Date   IRON 87 12/09/2020   FERRITIN 54.9 12/09/2020   Attestation Statements:   Reviewed by clinician on day of visit: allergies, medications, problem list, medical history, surgical history, family history, social history, and previous encounter notes.  I, Kathlene November, BS, CMA, am acting as transcriptionist for [employer], DO.   I have reviewed the above documentation for accuracy and completeness, and I agree with the above. Marjory Sneddon, D.O.  The [place] was signed into law in 2016 which includes the topic of electronic health records.  This provides immediate access to information in MyChart.  This includes consultation notes, operative notes, office notes, lab results and pathology reports.  If you have any questions about what you read please let us know at your next visit so we can discuss  your concerns and take corrective action if need be.  We are right here with you.

## 2022-03-03 ENCOUNTER — Other Ambulatory Visit: Payer: Self-pay

## 2022-03-03 MED ORDER — POLYMYXIN B-TRIMETHOPRIM 10000-0.1 UNIT/ML-% OP SOLN
OPHTHALMIC | 0 refills | Status: DC
  Filled 2022-03-03: qty 10, 10d supply, fill #0

## 2022-03-07 ENCOUNTER — Other Ambulatory Visit: Payer: Self-pay

## 2022-03-07 DIAGNOSIS — F902 Attention-deficit hyperactivity disorder, combined type: Secondary | ICD-10-CM | POA: Diagnosis not present

## 2022-03-07 DIAGNOSIS — F4322 Adjustment disorder with anxiety: Secondary | ICD-10-CM | POA: Diagnosis not present

## 2022-03-07 MED ORDER — AMPHETAMINE-DEXTROAMPHET ER 10 MG PO CP24
10.0000 mg | ORAL_CAPSULE | Freq: Every morning | ORAL | 0 refills | Status: DC
  Filled 2022-07-03: qty 30, 30d supply, fill #0

## 2022-03-07 MED ORDER — AMPHETAMINE-DEXTROAMPHET ER 10 MG PO CP24
10.0000 mg | ORAL_CAPSULE | ORAL | 0 refills | Status: DC
  Filled 2022-05-02: qty 30, 30d supply, fill #0

## 2022-03-07 MED ORDER — TEMAZEPAM 7.5 MG PO CAPS
7.5000 mg | ORAL_CAPSULE | Freq: Every evening | ORAL | 2 refills | Status: DC | PRN
  Filled 2022-03-22 – 2022-05-02 (×2): qty 30, 30d supply, fill #0

## 2022-03-07 MED ORDER — AMPHETAMINE-DEXTROAMPHET ER 10 MG PO CP24: 10.0000 mg | ORAL_CAPSULE | Freq: Every morning | ORAL | 0 refills | Status: DC

## 2022-03-07 MED ORDER — SERTRALINE HCL 50 MG PO TABS
75.0000 mg | ORAL_TABLET | Freq: Every day | ORAL | 2 refills | Status: DC
  Filled 2022-03-07 – 2022-05-02 (×3): qty 45, 30d supply, fill #0
  Filled 2022-06-01: qty 45, 30d supply, fill #1
  Filled 2022-07-03: qty 45, 30d supply, fill #2

## 2022-03-22 ENCOUNTER — Other Ambulatory Visit: Payer: Self-pay

## 2022-03-23 ENCOUNTER — Ambulatory Visit
Admission: RE | Admit: 2022-03-23 | Discharge: 2022-03-23 | Disposition: A | Discharge status: Home / Self Care | Payer: 59 | Source: Ambulatory Visit | Attending: Primary Care | Admitting: Primary Care

## 2022-03-23 DIAGNOSIS — Z1231 Encounter for screening mammogram for malignant neoplasm of breast: Secondary | ICD-10-CM | POA: Insufficient documentation

## 2022-03-24 ENCOUNTER — Other Ambulatory Visit: Payer: Self-pay | Admitting: Primary Care

## 2022-03-24 DIAGNOSIS — R928 Other abnormal and inconclusive findings on diagnostic imaging of breast: Secondary | ICD-10-CM

## 2022-03-24 DIAGNOSIS — N63 Unspecified lump in unspecified breast: Secondary | ICD-10-CM

## 2022-03-28 ENCOUNTER — Encounter (INDEPENDENT_AMBULATORY_CARE_PROVIDER_SITE_OTHER): Payer: Self-pay | Admitting: Family Medicine

## 2022-03-28 ENCOUNTER — Ambulatory Visit (INDEPENDENT_AMBULATORY_CARE_PROVIDER_SITE_OTHER): Payer: 59 | Admitting: Family Medicine

## 2022-03-28 ENCOUNTER — Other Ambulatory Visit: Payer: Self-pay

## 2022-03-28 VITALS — BP 123/85 | HR 57 | Temp 97.9°F | Ht 65.0 in | Wt 175.0 lb

## 2022-03-28 DIAGNOSIS — E038 Other specified hypothyroidism: Secondary | ICD-10-CM | POA: Diagnosis not present

## 2022-03-28 DIAGNOSIS — E559 Vitamin D deficiency, unspecified: Secondary | ICD-10-CM | POA: Diagnosis not present

## 2022-03-28 DIAGNOSIS — Z6829 Body mass index (BMI) 29.0-29.9, adult: Secondary | ICD-10-CM | POA: Diagnosis not present

## 2022-03-28 DIAGNOSIS — E669 Obesity, unspecified: Secondary | ICD-10-CM

## 2022-03-28 MED ORDER — SEMAGLUTIDE-WEIGHT MANAGEMENT 0.5 MG/0.5ML ~~LOC~~ SOAJ
0.5000 mg | SUBCUTANEOUS | 0 refills | Status: DC
  Filled 2022-03-28: qty 2, 28d supply, fill #0

## 2022-04-02 NOTE — Progress Notes (Unsigned)
Chief Complaint:   OBESITY Christine Adams is here to discuss her progress with her obesity treatment plan along with follow-up of her obesity related diagnoses. [name] is on the Category 2 Plan with breakfast and lunch options and states she is following her eating plan approximately 0% of the time. Christine Adams states she is horse back riding 30-40 minutes 5 times per week.  Today's visit was #: 11 Starting weight: 195 lbs Starting date: 08/04/2021 Today's weight: 175 lbs Today's date: 03/28/2022 Total lbs lost to date: 20 Total lbs lost since last in-office visit: +3  Interim History: Christine Adams went to [place] for a week for work and with her sister, and ate off plan. She is back on track. Here to review labs.  Subjective:   1. Vitamin D deficiency Discussed labs with patient today. She is currently taking OTC vitamin D 3000 IU each day. She denies nausea, vomiting or muscle weakness.  2. Other specified hypothyroidism Discussed labs with patient today. Chee is asymptomatic and without concerns. Medication: Synthroid  Assessment/Plan:  No orders of the defined types were placed in this encounter.   Medications Discontinued During This Encounter  Medication Reason   Semaglutide-Weight Management 0.5 MG/0.5ML SOAJ Reorder     Meds ordered this encounter  Medications   Semaglutide-Weight Management 0.5 MG/0.5ML SOAJ    Sig: Inject 0.5 mg into the skin once a week.    Dispense:  2 mL    Refill:  0     1. Vitamin D deficiency Vit D at goal at 77. Low Vitamin D level contributes to fatigue and are associated with obesity, breast, and colon cancer. She agrees to continue to take OTC Vitamin D 3,000 IU daily and will follow-up for routine testing of Vitamin D, at least 2-3 times per year to avoid over-replacement.  2. Other specified hypothyroidism TSH, Free T4 and T3 are at goal. Patient with long-standing hypothyroidism, on levothyroxine therapy. She appears euthyroid. Orders and  follow up as documented in patient record. Continue Synthroid 137 mcg daily.  Counseling Good thyroid control is important for overall health. Supratherapeutic thyroid levels are dangerous and will not improve weight loss results. The correct way to take levothyroxine is fasting, with water, separated by at least 30 minutes from breakfast, and separated by more than 4 hours from calcium, iron, multivitamins, acid reflux medications (PPIs).   3. Obesity with current BMI of 29.2 [name] is currently in the action stage of change. As such, her goal is to continue with weight loss efforts. She has agreed to the Category 2 Plan with breakfast and lunch options.   Refill- Semaglutide-Weight Management 0.5 MG/0.5ML SOAJ; Inject 0.5 mg into the skin once a week.  Dispense: 2 mL; Refill: 0  Exercise goals:  As is  Behavioral modification strategies: increasing lean protein intake, avoiding temptations, and planning for success.  [name] has agreed to follow-up with our clinic in 4 weeks. She was informed of the importance of frequent follow-up visits to maximize her success with intensive lifestyle modifications for her multiple health conditions.   Objective:   Blood pressure 123/85, pulse (!) 57, temperature 97.9 F (36.6 C), height '5\' 5"'$  (1.651 m), weight 175 lb (79.4 kg), last menstrual period 03/23/2022, SpO2 97 %, currently breastfeeding. Body mass index is 29.12 kg/m.  General: Cooperative, alert, well developed, in no acute distress. HEENT: Conjunctivae and lids unremarkable. Cardiovascular: Regular rhythm.  Lungs: Normal work of breathing. Neurologic: No focal deficits.   Lab Results  Component Value Date   CREATININE 0.70 08/04/2021   BUN 13 08/04/2021   NA 140 08/04/2021   K 4.3 08/04/2021   CL 103 08/04/2021   CO2 20 08/04/2021   Lab Results  Component Value Date   ALT 24 08/04/2021   AST 22 08/04/2021   ALKPHOS 92 08/04/2021   BILITOT 0.5 08/04/2021   Lab Results   Component Value Date   HGBA1C 5.1 08/04/2021   HGBA1C 5.2 12/06/2016   Lab Results  Component Value Date   INSULIN 13.3 08/04/2021   Lab Results  Component Value Date   TSH 2.140 02/22/2022   Lab Results  Component Value Date   CHOL 164 08/04/2021   HDL 32 (L) 08/04/2021   LDLCALC 94 08/04/2021   LDLDIRECT 105.0 02/14/2020   TRIG 225 (H) 08/04/2021   CHOLHDL 5 02/14/2020   Lab Results  Component Value Date   VD25OH 50.3 02/22/2022   VD25OH 50.3 02/22/2022   VD25OH 57.5 08/04/2021   Lab Results  Component Value Date   WBC 6.6 08/04/2021   HGB 15.7 08/04/2021   HCT 47.1 (H) 08/04/2021   MCV 88 08/04/2021   PLT 283 08/04/2021   Lab Results  Component Value Date   IRON 87 12/09/2020   FERRITIN 54.9 12/09/2020     Attestation Statements:   Reviewed by clinician on day of visit: allergies, medications, problem list, medical history, surgical history, family history, social history, and previous encounter notes.  I, Kathlene November, BS, CMA, am acting as transcriptionist for [employer], DO.   I have reviewed the above documentation for accuracy and completeness, and I agree with the above. Marjory Sneddon, D.O.  The [place] was signed into law in 2016 which includes the topic of electronic health records.  This provides immediate access to information in MyChart.  This includes consultation notes, operative notes, office notes, lab results and pathology reports.  If you have any questions about what you read please let us know at your next visit so we can discuss your concerns and take corrective action if need be.  We are right here with you.

## 2022-04-12 ENCOUNTER — Ambulatory Visit
Admission: RE | Admit: 2022-04-12 | Discharge: 2022-04-12 | Disposition: A | Discharge status: Home / Self Care | Payer: 59 | Source: Ambulatory Visit | Attending: Primary Care | Admitting: Primary Care

## 2022-04-12 ENCOUNTER — Other Ambulatory Visit: Payer: Self-pay

## 2022-04-12 DIAGNOSIS — R928 Other abnormal and inconclusive findings on diagnostic imaging of breast: Secondary | ICD-10-CM | POA: Insufficient documentation

## 2022-04-12 DIAGNOSIS — R92312 Mammographic fatty tissue density, left breast: Secondary | ICD-10-CM | POA: Diagnosis not present

## 2022-04-12 DIAGNOSIS — N63 Unspecified lump in unspecified breast: Secondary | ICD-10-CM | POA: Insufficient documentation

## 2022-04-12 DIAGNOSIS — N6322 Unspecified lump in the left breast, upper inner quadrant: Secondary | ICD-10-CM | POA: Diagnosis not present

## 2022-04-14 DIAGNOSIS — Z01419 Encounter for gynecological examination (general) (routine) without abnormal findings: Secondary | ICD-10-CM | POA: Diagnosis not present

## 2022-04-25 ENCOUNTER — Ambulatory Visit (INDEPENDENT_AMBULATORY_CARE_PROVIDER_SITE_OTHER): Payer: 59 | Admitting: Family Medicine

## 2022-04-25 ENCOUNTER — Encounter (INDEPENDENT_AMBULATORY_CARE_PROVIDER_SITE_OTHER): Payer: Self-pay | Admitting: Family Medicine

## 2022-04-25 ENCOUNTER — Other Ambulatory Visit: Payer: Self-pay

## 2022-04-25 VITALS — BP 130/89 | HR 70 | Temp 98.3°F | Ht 65.0 in | Wt 176.0 lb

## 2022-04-25 DIAGNOSIS — Z6829 Body mass index (BMI) 29.0-29.9, adult: Secondary | ICD-10-CM

## 2022-04-25 DIAGNOSIS — R030 Elevated blood-pressure reading, without diagnosis of hypertension: Secondary | ICD-10-CM

## 2022-04-25 DIAGNOSIS — E669 Obesity, unspecified: Secondary | ICD-10-CM

## 2022-04-25 DIAGNOSIS — E66811 Obesity, class 1: Secondary | ICD-10-CM

## 2022-04-25 HISTORY — DX: Elevated blood-pressure reading, without diagnosis of hypertension: R03.0

## 2022-04-25 MED ORDER — SEMAGLUTIDE-WEIGHT MANAGEMENT 1 MG/0.5ML ~~LOC~~ SOAJ
1.0000 mg | SUBCUTANEOUS | 0 refills | Status: DC
  Filled 2022-04-25: qty 2, 28d supply, fill #0

## 2022-05-02 ENCOUNTER — Other Ambulatory Visit: Payer: Self-pay

## 2022-05-02 ENCOUNTER — Other Ambulatory Visit: Payer: Self-pay | Admitting: Primary Care

## 2022-05-02 DIAGNOSIS — E039 Hypothyroidism, unspecified: Secondary | ICD-10-CM

## 2022-05-02 MED FILL — Levothyroxine Sodium Tab 137 MCG: ORAL | 90 days supply | Qty: 90 | Fill #0 | Status: AC

## 2022-05-04 NOTE — Progress Notes (Signed)
Chief Complaint:   OBESITY Christine Adams is here to discuss her progress with her obesity treatment plan along with follow-up of her obesity related diagnoses. Christine Adams is on the Category 2 Plan and states she is following her eating plan approximately 50% of the time. Christine Adams states she is horseback riding or walking 45 minutes 4 times per week.  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 Thanksgiving is not a trigger.  Subjective:   1. Elevated BP without diagnosis of hypertension Christine Adams has no diagnoses of hypertension. She has been more stressed lately.  Assessment/Plan:  No orders of the defined types were placed in this encounter.   Medications Discontinued During This Encounter  Medication Reason   Semaglutide-Weight Management 0.5 MG/0.5ML SOAJ      Meds ordered this encounter  Medications   Semaglutide-Weight Management 1 MG/0.5ML SOAJ    Sig: Inject 1 mg into the skin once a week.    Dispense:  2 mL    Refill:  0     1. Elevated BP without diagnosis of hypertension Decrease salt and increase water. Continue PNP and weight loss. We will continue to monitor closely.   2. Obesity with current BMI of 29.4 We will Increase/refill Wegovy from 0.5 mg to 1 mg once weekly for 1 month with 0 refills. Good control of hunger and cravings.   -Increase/Refill Semaglutide-Weight Management 1 MG/0.5ML SOAJ; Inject 1 mg into the skin once a week.  Dispense: 2 mL; Refill: 0  Christine Adams is currently in the action stage of change. As such, her goal is to continue with weight loss efforts. She has agreed to the Category 2 Plan.   Exercise goals: As is.  Behavioral modification strategies: increasing lean protein intake and decreasing simple  carbohydrates.  Christine Adams has agreed to follow-up with our clinic in 5 weeks. She was informed of the importance of frequent follow-up visits to maximize her success with intensive lifestyle modifications for her multiple health conditions.   Objective:   Blood pressure 130/89, pulse 70, temperature 98.3 F (36.8 C), height '5\' 5"'$  (1.651 m), weight 176 lb (79.8 kg), SpO2 96 %, currently breastfeeding. Body mass index is 29.29 kg/m.  General: Cooperative, alert, well developed, in no acute distress. HEENT: Conjunctivae and lids unremarkable. Cardiovascular: Regular rhythm.  Lungs: Normal work of breathing. Neurologic: No focal deficits.   Lab Results  Component Value Date   CREATININE 0.70 08/04/2021   BUN 13 08/04/2021   NA 140 08/04/2021   K 4.3 08/04/2021   CL 103 08/04/2021   CO2 20 08/04/2021   Lab Results  Component Value Date   ALT 24 08/04/2021   AST 22 08/04/2021   ALKPHOS 92 08/04/2021   BILITOT 0.5 08/04/2021   Lab Results  Component Value Date   HGBA1C 5.1 08/04/2021   HGBA1C 5.2 12/06/2016   Lab Results  Component Value Date   INSULIN 13.3 08/04/2021   Lab Results  Component Value Date   TSH 2.140 02/22/2022   Lab Results  Component Value Date   CHOL 164 08/04/2021   HDL 32 (L) 08/04/2021   LDLCALC 94 08/04/2021   LDLDIRECT 105.0 02/14/2020   TRIG 225 (H) 08/04/2021   CHOLHDL 5 02/14/2020   Lab Results  Component Value Date   VD25OH 50.3 02/22/2022   VD25OH 50.3 02/22/2022   VD25OH 57.5 08/04/2021   Lab Results  Component Value Date   WBC 6.6 08/04/2021   HGB 15.7 08/04/2021   HCT 47.1 (H) 08/04/2021   MCV 88 08/04/2021   PLT 283 08/04/2021   Lab Results  Component Value Date   IRON 87 12/09/2020   FERRITIN 54.9 12/09/2020   Attestation Statements:   Reviewed by clinician on day of visit: allergies, medications, problem list, medical history, surgical history, family history, social history, and previous encounter notes.  I,  Brendell Tyus, am acting as Location manager for [employer], DO.   I have reviewed the above documentation for accuracy and completeness, and I agree with the above. Marjory Sneddon, D.O.  The [place] was signed into law in 2016 which includes the topic of electronic health records.  This provides immediate access to information in MyChart.  This includes consultation notes, operative notes, office notes, lab results and pathology reports.  If you have any questions about what you read please let us know at your next visit so we can discuss your concerns and take corrective action if need be.  We are right here with you.

## 2022-05-06 ENCOUNTER — Other Ambulatory Visit: Payer: Self-pay

## 2022-06-01 ENCOUNTER — Other Ambulatory Visit: Payer: Self-pay

## 2022-06-02 ENCOUNTER — Encounter (INDEPENDENT_AMBULATORY_CARE_PROVIDER_SITE_OTHER): Payer: Self-pay | Admitting: Family Medicine

## 2022-06-02 ENCOUNTER — Ambulatory Visit (INDEPENDENT_AMBULATORY_CARE_PROVIDER_SITE_OTHER): Payer: 59 | Admitting: Family Medicine

## 2022-06-02 ENCOUNTER — Other Ambulatory Visit: Payer: Self-pay

## 2022-06-02 VITALS — BP 119/84 | HR 85 | Temp 98.4°F | Ht 65.0 in | Wt 170.8 lb

## 2022-06-02 DIAGNOSIS — E781 Pure hyperglyceridemia: Secondary | ICD-10-CM | POA: Diagnosis not present

## 2022-06-02 DIAGNOSIS — E88819 Insulin resistance, unspecified: Secondary | ICD-10-CM

## 2022-06-02 DIAGNOSIS — E559 Vitamin D deficiency, unspecified: Secondary | ICD-10-CM | POA: Diagnosis not present

## 2022-06-02 DIAGNOSIS — E669 Obesity, unspecified: Secondary | ICD-10-CM | POA: Diagnosis not present

## 2022-06-02 DIAGNOSIS — Z6828 Body mass index (BMI) 28.0-28.9, adult: Secondary | ICD-10-CM

## 2022-06-02 DIAGNOSIS — E7849 Other hyperlipidemia: Secondary | ICD-10-CM

## 2022-06-02 MED ORDER — SEMAGLUTIDE-WEIGHT MANAGEMENT 1 MG/0.5ML ~~LOC~~ SOAJ
1.0000 mg | SUBCUTANEOUS | 0 refills | Status: DC
  Filled 2022-06-02: qty 2, 28d supply, fill #0

## 2022-06-03 ENCOUNTER — Other Ambulatory Visit: Payer: Self-pay

## 2022-06-03 LAB — COMPREHENSIVE METABOLIC PANEL
ALT: 11 IU/L (ref 0–32)
AST: 17 IU/L (ref 0–40)
Albumin/Globulin Ratio: 1.8 (ref 1.2–2.2)
Albumin: 4.6 g/dL (ref 3.9–4.9)
Alkaline Phosphatase: 97 IU/L (ref 44–121)
BUN/Creatinine Ratio: 21 (ref 9–23)
BUN: 15 mg/dL (ref 6–24)
Bilirubin Total: 0.5 mg/dL (ref 0.0–1.2)
CO2: 21 mmol/L (ref 20–29)
Calcium: 9.1 mg/dL (ref 8.7–10.2)
Chloride: 103 mmol/L (ref 96–106)
Creatinine, Ser: 0.71 mg/dL (ref 0.57–1.00)
Globulin, Total: 2.5 g/dL (ref 1.5–4.5)
Glucose: 84 mg/dL (ref 70–99)
Potassium: 4.5 mmol/L (ref 3.5–5.2)
Sodium: 138 mmol/L (ref 134–144)
Total Protein: 7.1 g/dL (ref 6.0–8.5)
eGFR: 110 mL/min/{1.73_m2} (ref >59)

## 2022-06-03 LAB — CBC WITH DIFFERENTIAL/PLATELET
Basophils Absolute: 0.1 10*3/uL (ref 0.0–0.2)
Basos: 1 %
EOS (ABSOLUTE): 0.1 10*3/uL (ref 0.0–0.4)
Eos: 2 %
Hematocrit: 46.1 % (ref 34.0–46.6)
Hemoglobin: 15.4 g/dL (ref 11.1–15.9)
Immature Grans (Abs): 0 10*3/uL (ref 0.0–0.1)
Immature Granulocytes: 0 %
Lymphocytes Absolute: 2.2 10*3/uL (ref 0.7–3.1)
Lymphs: 34 %
MCH: 29.4 pg (ref 26.6–33.0)
MCHC: 33.4 g/dL (ref 31.5–35.7)
MCV: 88 fL (ref 79–97)
Monocytes Absolute: 0.3 10*3/uL (ref 0.1–0.9)
Monocytes: 4 %
Neutrophils Absolute: 3.8 10*3/uL (ref 1.4–7.0)
Neutrophils: 59 %
Platelets: 267 10*3/uL (ref 150–450)
RBC: 5.24 x10E6/uL (ref 3.77–5.28)
RDW: 12.6 % (ref 11.7–15.4)
WBC: 6.4 10*3/uL (ref 3.4–10.8)

## 2022-06-03 LAB — LIPID PANEL
Chol/HDL Ratio: 4.6 ratio — ABNORMAL HIGH (ref 0.0–4.4)
Cholesterol, Total: 162 mg/dL (ref 100–199)
HDL: 35 mg/dL — ABNORMAL LOW (ref >39)
LDL Chol Calc (NIH): 107 mg/dL — ABNORMAL HIGH (ref 0–99)
Triglycerides: 106 mg/dL (ref 0–149)
VLDL Cholesterol Cal: 20 mg/dL (ref 5–40)

## 2022-06-03 LAB — INSULIN, RANDOM: INSULIN: 18.6 u[IU]/mL (ref 2.6–24.9)

## 2022-06-03 LAB — HEMOGLOBIN A1C
Est. average glucose Bld gHb Est-mCnc: 100 mg/dL
Hgb A1c MFr Bld: 5.1 % (ref 4.8–5.6)

## 2022-06-03 LAB — VITAMIN D 25 HYDROXY (VIT D DEFICIENCY, FRACTURES): Vit D, 25-Hydroxy: 59.1 ng/mL (ref 30.0–100.0)

## 2022-06-03 MED ORDER — COVID-19 MRNA VAC-TRIS(PFIZER) 30 MCG/0.3ML IM SUSY
PREFILLED_SYRINGE | INTRAMUSCULAR | 0 refills | Status: DC
  Filled 2022-06-03: qty 0.3, 1d supply, fill #0

## 2022-06-07 NOTE — Progress Notes (Signed)
Chief Complaint:   OBESITY Christine Adams is here to discuss her progress with her obesity treatment plan along with follow-up of her obesity related diagnoses. Christine Adams is on the Category 2 Plan and states she is following her eating plan approximately 50% of the time. Christine Adams states she is not exercising.  Today's visit was #: 79 Starting weight: 195 LBS Starting date: 08/04/2021 Today's weight: 170 LBS Today's date: 06/02/2022 Total lbs lost to date: 25 LBS Total lbs lost since last in-office visit: 6 LBS  Interim History: Patient recommitted herself and decrease chocolate and increase protein intake.  She is doing portion control.  Subjective:   1. Insulin resistance Patient denies any cravings at this time.  2. Other hyperlipidemia Patient has had increased triglycerides and a low HDL in the past.  She has not taken any medication.  3. Vitamin D deficiency She is currently taking OTC vitamin D 3,000 IU each day. She denies nausea, vomiting or muscle weakness.  Last vitamin D level on 02/22/2022 was 50.3.  Assessment/Plan:   Orders Placed This Encounter  Procedures   VITAMIN D 25 Hydroxy (Vit-D Deficiency, Fractures)   Lipid panel   Insulin, random   Hemoglobin A1c   Comprehensive metabolic panel   CBC with Differential/Platelet    Medications Discontinued During This Encounter  Medication Reason   Semaglutide-Weight Management 1 MG/0.5ML SOAJ Reorder     Meds ordered this encounter  Medications   Semaglutide-Weight Management 1 MG/0.5ML SOAJ    Sig: Inject 1 mg into the skin once a week.    Dispense:  2 mL    Refill:  0     1. Insulin resistance Christine Adams will continue to work on weight loss, exercise, and decreasing simple carbohydrates to help decrease the risk of diabetes. Christine Adams agreed to follow-up with [place] as directed to closely monitor her progress.  Check labs today.  - Insulin, random - Hemoglobin A1c - CBC with Differential/Platelet  2. Other  hyperlipidemia Continue PNP and low saturated and trans fats.  Check labs today  - Lipid panel - Comprehensive metabolic panel  3. Vitamin D deficiency Continue OTC supplement and outdoor exposure.  Check labs today  - VITAMIN D 25 Hydroxy (Vit-D Deficiency, Fractures) - CBC with Differential/Platelet  4. Obesity with current BMI of 28.4 No need for change in dose.  She is tolerating medication well.  She has great control of hunger and cravings with her increased protein intake.  Refill- Semaglutide-Weight Management 1 MG/0.5ML SOAJ; Inject 1 mg into the skin once a week.  Dispense: 2 mL; Refill: 0  Christine Adams is currently in the action stage of change. As such, her goal is to continue with weight loss efforts. She has agreed to the Category 2 Plan.   Exercise goals: All adults should avoid inactivity. Some physical activity is better than none, and adults who participate in any amount of physical activity gain some health benefits.  Behavioral modification strategies: holiday eating strategies .  Christine Adams has agreed to follow-up with our clinic in 4-5 weeks. She was informed of the importance of frequent follow-up visits to maximize her success with intensive lifestyle modifications for her multiple health conditions.   Christine Adams was informed we would discuss her lab results at her next visit unless there is a critical issue that needs to be addressed sooner. Christine Adams agreed to keep her next visit at the agreed upon time to discuss these results.  Objective:   Blood pressure 119/84, pulse 85, temperature  98.4 F (36.9 C), height '5\' 5"'$  (1.651 m), weight 170 lb 12.8 oz (77.5 kg), SpO2 99 %, currently breastfeeding. Body mass index is 28.42 kg/m.  General: Cooperative, alert, well developed, in no acute distress. HEENT: Conjunctivae and lids unremarkable. Cardiovascular: Regular rhythm.  Lungs: Normal work of breathing. Neurologic: No focal deficits.   Lab Results  Component Value  Date   CREATININE 0.71 06/02/2022   BUN 15 06/02/2022   NA 138 06/02/2022   K 4.5 06/02/2022   CL 103 06/02/2022   CO2 21 06/02/2022   Lab Results  Component Value Date   ALT 11 06/02/2022   AST 17 06/02/2022   ALKPHOS 97 06/02/2022   BILITOT 0.5 06/02/2022   Lab Results  Component Value Date   HGBA1C 5.1 06/02/2022   HGBA1C 5.1 08/04/2021   HGBA1C 5.2 12/06/2016   Lab Results  Component Value Date   INSULIN 18.6 06/02/2022   INSULIN 13.3 08/04/2021   Lab Results  Component Value Date   TSH 2.140 02/22/2022   Lab Results  Component Value Date   CHOL 162 06/02/2022   HDL 35 (L) 06/02/2022   LDLCALC 107 (H) 06/02/2022   LDLDIRECT 105.0 02/14/2020   TRIG 106 06/02/2022   CHOLHDL 4.6 (H) 06/02/2022   Lab Results  Component Value Date   VD25OH 59.1 06/02/2022   VD25OH 50.3 02/22/2022   VD25OH 50.3 02/22/2022   Lab Results  Component Value Date   WBC 6.4 06/02/2022   HGB 15.4 06/02/2022   HCT 46.1 06/02/2022   MCV 88 06/02/2022   PLT 267 06/02/2022   Lab Results  Component Value Date   IRON 87 12/09/2020   FERRITIN 54.9 12/09/2020   Attestation Statements:   Reviewed by clinician on day of visit: allergies, medications, problem list, medical history, surgical history, family history, social history, and previous encounter notes.  I, Davy Pique, RMA, am acting as Location manager for [employer], DO.   I have reviewed the above documentation for accuracy and completeness, and I agree with the above. Marjory Sneddon, D.O.  The [place] was signed into law in 2016 which includes the topic of electronic health records.  This provides immediate access to information in MyChart.  This includes consultation notes, operative notes, office notes, lab results and pathology reports.  If you have any questions about what you read please let us know at your next visit so we can discuss your concerns and take corrective action if need be.  We are  right here with you.

## 2022-07-04 ENCOUNTER — Other Ambulatory Visit: Payer: Self-pay

## 2022-07-07 ENCOUNTER — Encounter (INDEPENDENT_AMBULATORY_CARE_PROVIDER_SITE_OTHER): Payer: Self-pay | Admitting: Family Medicine

## 2022-07-07 ENCOUNTER — Ambulatory Visit (INDEPENDENT_AMBULATORY_CARE_PROVIDER_SITE_OTHER): Payer: Commercial Managed Care - PPO | Admitting: Family Medicine

## 2022-07-07 ENCOUNTER — Other Ambulatory Visit: Payer: Self-pay

## 2022-07-07 VITALS — BP 132/79 | HR 67 | Temp 98.2°F | Ht 65.0 in | Wt 170.0 lb

## 2022-07-07 DIAGNOSIS — E559 Vitamin D deficiency, unspecified: Secondary | ICD-10-CM

## 2022-07-07 DIAGNOSIS — Z6828 Body mass index (BMI) 28.0-28.9, adult: Secondary | ICD-10-CM | POA: Diagnosis not present

## 2022-07-07 DIAGNOSIS — E669 Obesity, unspecified: Secondary | ICD-10-CM | POA: Diagnosis not present

## 2022-07-07 DIAGNOSIS — E88819 Insulin resistance, unspecified: Secondary | ICD-10-CM | POA: Diagnosis not present

## 2022-07-07 DIAGNOSIS — E7849 Other hyperlipidemia: Secondary | ICD-10-CM | POA: Diagnosis not present

## 2022-07-07 MED ORDER — SEMAGLUTIDE-WEIGHT MANAGEMENT 1 MG/0.5ML ~~LOC~~ SOAJ
1.0000 mg | SUBCUTANEOUS | 0 refills | Status: DC
  Filled 2022-07-07: qty 2, 28d supply, fill #0

## 2022-07-24 NOTE — Progress Notes (Signed)
Chief Complaint:   OBESITY Christine Adams is here to discuss her progress with her obesity treatment plan along with follow-up of her obesity related diagnoses. Christine Adams is on the Category 2 Plan and states Christine Adams is following her eating plan approximately 75% of the time. Christine Adams states Christine Adams is not currently exercising.  Today's visit was #: 14 Starting weight: 195 lbs Starting date: 08/04/2021 Today's weight: 170 lbs Today's date: 07/07/2022 Total lbs lost to date: 25 Total lbs lost since last in-office visit: 0  Interim History: Christine Adams had COVID and a GI illness, now broken ribs. Christine Adams amazingly did not gain weight over the holidays, especially with all the stress her body has been under.  Subjective:   1. Insulin resistance Discussed labs with patient today. Christine Adams is on Wegovy with no side effects.  2. Vitamin D deficiency Discussed labs with patient today. Christine Adams takes OTC Vitamin D 3,000 IU daily.  3. Other hyperlipidemia-with increased TG Discussed labs with patient today. Christine Adams has been increasing lean proteins and not eating much animal fats. Christine Adams rarely eats red meat.  4. Obesity with current BMI of 28.4 Discussed labs with patient today. Yesica is taking Wegovy every 2-3 weeks now. Christine Adams feels in control of hunger and cravings and has no issues.  Assessment/Plan:  No orders of the defined types were placed in this encounter.   Medications Discontinued During This Encounter  Medication Reason   Semaglutide-Weight Management 1 MG/0.5ML SOAJ Reorder     Meds ordered this encounter  Medications   Semaglutide-Weight Management 1 MG/0.5ML SOAJ    Sig: Inject 1 mg into the skin once a week.    Dispense:  2 mL    Refill:  0     1. Insulin resistance Devyn will continue to work on weight loss, exercise, and decreasing simple carbohydrates to help decrease the risk of diabetes. Prabhnoor agreed to follow-up with [place] as directed to closely monitor her progress. Counseling done.  2.  Vitamin D deficiency Vitamin D level at goal. Continue supplementation as is.  3. Other hyperlipidemia-with increased TG LDL is mildly elevated but improved. Triglycerides now look great and within normal limits. Previous level was in the upper 200's. Continue to decrease fatty carbs.  4. Obesity with current BMI of 28.4 Christine Adams is currently in the action stage of change. As such, her goal is to continue with weight loss efforts. Christine Adams has agreed to the Category 2 Plan with breakfast and lunch options.   Christine Adams denies issues with meds or meal plan. Continue same regimen. Continue prudent nutritional plan and push water. Refill- Semaglutide-Weight Management 1 MG/0.5ML SOAJ; Inject 1 mg into the skin once a week.  Dispense: 2 mL; Refill: 0  Exercise goals:  Continue exercise as tolerated once ribs heal.  Behavioral modification strategies: increasing lean protein intake, decreasing simple carbohydrates, and increasing water intake.  Christine Adams has agreed to follow-up with our clinic in 4 weeks. Christine Adams was informed of the importance of frequent follow-up visits to maximize her success with intensive lifestyle modifications for her multiple health conditions.   Objective:   Blood pressure 132/79, pulse 67, temperature 98.2 F (36.8 C), height '5\' 5"'$  (1.651 m), weight 170 lb (77.1 kg), SpO2 99 %, currently breastfeeding. Body mass index is 28.29 kg/m.  General: Cooperative, alert, well developed, in no acute distress. HEENT: Conjunctivae and lids unremarkable. Cardiovascular: Regular rhythm.  Lungs: Normal work of breathing. Neurologic: No focal deficits.   Lab Results  Component Value Date  CREATININE 0.71 06/02/2022   BUN 15 06/02/2022   NA 138 06/02/2022   K 4.5 06/02/2022   CL 103 06/02/2022   CO2 21 06/02/2022   Lab Results  Component Value Date   ALT 11 06/02/2022   AST 17 06/02/2022   ALKPHOS 97 06/02/2022   BILITOT 0.5 06/02/2022   Lab Results  Component Value Date   HGBA1C  5.1 06/02/2022   HGBA1C 5.1 08/04/2021   HGBA1C 5.2 12/06/2016   Lab Results  Component Value Date   INSULIN 18.6 06/02/2022   INSULIN 13.3 08/04/2021   Lab Results  Component Value Date   TSH 2.140 02/22/2022   Lab Results  Component Value Date   CHOL 162 06/02/2022   HDL 35 (L) 06/02/2022   LDLCALC 107 (H) 06/02/2022   LDLDIRECT 105.0 02/14/2020   TRIG 106 06/02/2022   CHOLHDL 4.6 (H) 06/02/2022   Lab Results  Component Value Date   VD25OH 59.1 06/02/2022   VD25OH 50.3 02/22/2022   VD25OH 50.3 02/22/2022   Lab Results  Component Value Date   WBC 6.4 06/02/2022   HGB 15.4 06/02/2022   HCT 46.1 06/02/2022   MCV 88 06/02/2022   PLT 267 06/02/2022   Lab Results  Component Value Date   IRON 87 12/09/2020   FERRITIN 54.9 12/09/2020   Attestation Statements:   Reviewed by clinician on day of visit: allergies, medications, problem list, medical history, surgical history, family history, social history, and previous encounter notes.  Time spent on visit including pre-visit chart review and post-visit care and charting was 40 minutes.   I, Kathlene November, BS, CMA, am acting as transcriptionist for [employer], DO.   I have reviewed the above documentation for accuracy and completeness, and I agree with the above. Marjory Sneddon, D.O.  The [place] was signed into law in 2016 which includes the topic of electronic health records.  This provides immediate access to information in MyChart.  This includes consultation notes, operative notes, office notes, lab results and pathology reports.  If you have any questions about what you read please let us know at your next visit so we can discuss your concerns and take corrective action if need be.  We are right here with you.

## 2022-07-28 ENCOUNTER — Ambulatory Visit (INDEPENDENT_AMBULATORY_CARE_PROVIDER_SITE_OTHER): Payer: Commercial Managed Care - PPO | Admitting: Family Medicine

## 2022-07-28 ENCOUNTER — Encounter (INDEPENDENT_AMBULATORY_CARE_PROVIDER_SITE_OTHER): Payer: Self-pay | Admitting: Family Medicine

## 2022-07-28 ENCOUNTER — Other Ambulatory Visit: Payer: Self-pay

## 2022-07-28 VITALS — BP 130/89 | HR 60 | Temp 97.9°F | Ht 65.0 in | Wt 166.8 lb

## 2022-07-28 DIAGNOSIS — E038 Other specified hypothyroidism: Secondary | ICD-10-CM | POA: Diagnosis not present

## 2022-07-28 DIAGNOSIS — E669 Obesity, unspecified: Secondary | ICD-10-CM

## 2022-07-28 DIAGNOSIS — Z6832 Body mass index (BMI) 32.0-32.9, adult: Secondary | ICD-10-CM

## 2022-07-28 DIAGNOSIS — Z6827 Body mass index (BMI) 27.0-27.9, adult: Secondary | ICD-10-CM | POA: Diagnosis not present

## 2022-07-28 MED ORDER — SEMAGLUTIDE-WEIGHT MANAGEMENT 1 MG/0.5ML ~~LOC~~ SOAJ
1.0000 mg | SUBCUTANEOUS | 0 refills | Status: DC
  Filled 2022-07-28 – 2022-08-19 (×2): qty 2, 28d supply, fill #0

## 2022-07-29 ENCOUNTER — Other Ambulatory Visit: Payer: Self-pay

## 2022-07-29 DIAGNOSIS — F902 Attention-deficit hyperactivity disorder, combined type: Secondary | ICD-10-CM | POA: Diagnosis not present

## 2022-07-29 DIAGNOSIS — F4322 Adjustment disorder with anxiety: Secondary | ICD-10-CM | POA: Diagnosis not present

## 2022-07-29 MED ORDER — AMPHETAMINE-DEXTROAMPHET ER 10 MG PO CP24
10.0000 mg | ORAL_CAPSULE | Freq: Every morning | ORAL | 0 refills | Status: DC
  Filled 2023-01-04: qty 30, 30d supply, fill #0

## 2022-07-29 MED ORDER — SERTRALINE HCL 50 MG PO TABS
75.0000 mg | ORAL_TABLET | Freq: Every day | ORAL | 2 refills | Status: DC
  Filled 2022-07-29 – 2022-08-19 (×2): qty 45, 30d supply, fill #0
  Filled 2022-09-19: qty 45, 30d supply, fill #1
  Filled 2023-01-04: qty 45, 30d supply, fill #2

## 2022-07-29 MED ORDER — TEMAZEPAM 7.5 MG PO CAPS
7.5000 mg | ORAL_CAPSULE | Freq: Every evening | ORAL | 2 refills | Status: DC | PRN
  Filled 2022-07-29 – 2022-08-19 (×2): qty 30, 30d supply, fill #0

## 2022-07-29 MED ORDER — AMPHETAMINE-DEXTROAMPHET ER 10 MG PO CP24
10.0000 mg | ORAL_CAPSULE | Freq: Every morning | ORAL | 0 refills | Status: DC
  Filled 2022-08-05 – 2022-08-19 (×2): qty 30, 30d supply, fill #0

## 2022-07-29 MED ORDER — AMPHETAMINE-DEXTROAMPHET ER 10 MG PO CP24
10.0000 mg | ORAL_CAPSULE | Freq: Every morning | ORAL | 0 refills | Status: DC
  Filled 2022-09-19: qty 30, 30d supply, fill #0

## 2022-08-05 ENCOUNTER — Other Ambulatory Visit: Payer: Self-pay

## 2022-08-11 ENCOUNTER — Ambulatory Visit: Payer: Commercial Managed Care - PPO | Admitting: Dermatology

## 2022-08-11 ENCOUNTER — Encounter: Payer: Self-pay | Admitting: Dermatology

## 2022-08-11 VITALS — BP 122/79 | HR 70

## 2022-08-11 DIAGNOSIS — L814 Other melanin hyperpigmentation: Secondary | ICD-10-CM | POA: Diagnosis not present

## 2022-08-11 DIAGNOSIS — L719 Rosacea, unspecified: Secondary | ICD-10-CM

## 2022-08-11 DIAGNOSIS — Z1283 Encounter for screening for malignant neoplasm of skin: Secondary | ICD-10-CM

## 2022-08-11 DIAGNOSIS — D229 Melanocytic nevi, unspecified: Secondary | ICD-10-CM | POA: Diagnosis not present

## 2022-08-11 DIAGNOSIS — L578 Other skin changes due to chronic exposure to nonionizing radiation: Secondary | ICD-10-CM | POA: Diagnosis not present

## 2022-08-11 DIAGNOSIS — L821 Other seborrheic keratosis: Secondary | ICD-10-CM

## 2022-08-11 DIAGNOSIS — D2361 Other benign neoplasm of skin of right upper limb, including shoulder: Secondary | ICD-10-CM | POA: Diagnosis not present

## 2022-08-11 DIAGNOSIS — L570 Actinic keratosis: Secondary | ICD-10-CM

## 2022-08-11 DIAGNOSIS — D2362 Other benign neoplasm of skin of left upper limb, including shoulder: Secondary | ICD-10-CM

## 2022-08-11 MED ORDER — FLUOROURACIL 5 % EX CREA: TOPICAL_CREAM | CUTANEOUS | 0 refills | Status: DC

## 2022-08-11 NOTE — Patient Instructions (Addendum)
- Start 5-fluorouracil cream twice a day for 7 days to affected areas including right nasal ala.    5-Fluorouracil/Calcipotriene Patient Education   Actinic keratoses are the dry, red scaly spots on the skin caused by sun damage. A portion of these spots can turn into skin cancer with time, and treating them can help prevent development of skin cancer.   Treatment of these spots requires removal of the defective skin cells. There are various ways to remove actinic keratoses, including freezing with liquid nitrogen, treatment with creams, or treatment with a blue light procedure in the office.   5-fluorouracil cream is a topical cream used to treat actinic keratoses. It works by interfering with the growth of abnormal fast-growing skin cells, such as actinic keratoses. These cells peel off and are replaced by healthy ones.   5-fluorouracil/calcipotriene is a combination of the 5-fluorouracil cream with a vitamin D analog cream called calcipotriene. The calcipotriene alone does not treat actinic keratoses. However, when it is combined with 5-fluorouracil, it helps the 5-fluorouracil treat the actinic keratoses much faster so that the same results can be achieved with a much shorter treatment time.  INSTRUCTIONS FOR 5-FLUOROURACIL/CALCIPOTRIENE CREAM:   5-fluorouracil/calcipotriene cream typically only needs to be used for 4-7 days. A thin layer should be applied twice a day to the treatment areas recommended by your physician.   If your physician prescribed you separate tubes of 5-fluourouracil and calcipotriene, apply a thin layer of 5-fluorouracil followed by a thin layer of calcipotriene.   Avoid contact with your eyes, nostrils, and mouth. Do not use 5-fluorouracil/calcipotriene cream on infected or open wounds.   You will develop redness, irritation and some crusting at areas where you have pre-cancer damage/actinic keratoses. IF YOU DEVELOP PAIN, BLEEDING, OR SIGNIFICANT CRUSTING, STOP THE  TREATMENT EARLY - you have already gotten a good response and the actinic keratoses should clear up well.  Wash your hands after applying 5-fluorouracil 5% cream on your skin.   A moisturizer or sunscreen with a minimum SPF 30 should be applied each morning.   Once you have finished the treatment, you can apply a thin layer of Vaseline twice a day to irritated areas to soothe and calm the areas more quickly. If you experience significant discomfort, contact your physician.  For some patients it is necessary to repeat the treatment for best results.  SIDE EFFECTS: When using 5-fluorouracil/calcipotriene cream, you may have mild irritation, such as redness, dryness, swelling, or a mild burning sensation. This usually resolves within 2 weeks. The more actinic keratoses you have, the more redness and inflammation you can expect during treatment. Eye irritation has been reported rarely. If this occurs, please let us know.  If you have any trouble using this cream, please call the office. If you have any other questions about this information, please do not hesitate to ask me before you leave the office.           Topical retinoid medications like tretinoin/Retin-A, adapalene/Differin, tazarotene/Fabior, and Epiduo/Epiduo Forte can cause dryness and irritation when first started. Only apply a pea-sized amount to the entire affected area. Avoid applying it around the eyes, edges of mouth and creases at the nose. If you experience irritation, use a good moisturizer first and/or apply the medicine less often. If you are doing well with the medicine, you can increase how often you use it until you are applying every night. Be careful with sun protection while using this medication as it can make you sensitive  to the sun. This medicine should not be used by pregnant women.      Instructions for [facility] Medications  One or more of your medications was sent to the [facility] mail order  compounding pharmacy. You will receive an email from them and can purchase the medicine through that link. It will then be mailed to your home at the address you confirmed. If for any reason you do not receive an email from them, please check your spam folder. If you still do not find the email, please let us know. [facility] phone number is [phone].    Recommend taking Heliocare sun protection supplement daily in sunny weather for additional sun protection. For maximum protection on the sunniest days, you can take up to 2 capsules of regular Heliocare OR take 1 capsule of Heliocare Ultra. For prolonged exposure (such as a full day in the sun), you can repeat your dose of the supplement 4 hours after your first dose. Heliocare can be purchased at [employer], at some [facility] or at [email or web address].      Melanoma ABCDEs  Melanoma is the most dangerous type of skin cancer, and is the leading cause of death from skin disease.  You are more likely to develop melanoma if you: Have light-colored skin, light-colored eyes, or red or blond hair Spend a lot of time in the sun Tan regularly, either outdoors or in a tanning bed Have had blistering sunburns, especially during childhood Have a close family member who has had a melanoma Have atypical moles or large birthmarks  Early detection of melanoma is key since treatment is typically straightforward and cure rates are extremely high if we catch it early.   The first sign of melanoma is often a change in a mole or a new dark spot.  The ABCDE system is a way of remembering the signs of melanoma.  A for asymmetry:  The two halves do not match. B for border:  The edges of the growth are irregular. C for color:  A mixture of colors are present instead of an even brown color. D for diameter:  Melanomas are usually (but not always) greater than 80m - the size of a pencil eraser. E for evolution:  The spot keeps changing in size,  shape, and color.  Please check your skin once per month between visits. You can use a small mirror in front and a large mirror behind you to keep an eye on the back side or your body.   If you see any new or changing lesions before your next follow-up, please call to schedule a visit.  Please continue daily skin protection including broad spectrum sunscreen SPF 30+ to sun-exposed areas, reapplying every 2 hours as needed when you're outdoors.   Staying in the shade or wearing long sleeves, sun glasses (UVA+UVB protection) and wide brim hats (4-inch brim around the entire circumference of the hat) are also recommended for sun protection.    Due to recent changes in healthcare laws, you may see results of your pathology and/or laboratory studies on MyChart before the doctors have had a chance to review them. We understand that in some cases there may be results that are confusing or concerning to you. Please understand that not all results are received at the same time and often the doctors may need to interpret multiple results in order to provide you with the best plan of care or course of treatment. Therefore, we ask that you  please give [place] 2 business days to thoroughly review all your results before contacting the office for clarification. Should we see a critical lab result, you will be contacted sooner.   If You Need Anything After Your Visit  If you have any questions or concerns for your doctor, please call our main line at [phone] and press option 4 to reach your doctor's medical assistant. If no one answers, please leave a voicemail as directed and we will return your call as soon as possible. Messages left after 4 pm will be answered the following business day.   You may also send [place] a message via [place]. We typically respond to MyChart messages within 1-2 business days.  For prescription refills, please ask your pharmacy to contact our office. Our fax number is [phone].  If  you have an urgent issue when the clinic is closed that cannot wait until the next business day, you can page your doctor at the number below.    Please note that while we do our best to be available for urgent issues outside of office hours, we are not available 24/7.   If you have an urgent issue and are unable to reach [place], you may choose to seek medical care at your doctor's office, retail clinic, urgent care center, or emergency room.  If you have a medical emergency, please immediately call 911 or go to the emergency department.  Pager Numbers  - Dr. Nehemiah Massed: [phone]  - Dr. Laurence Ferrari: [phone]  - Dr. Nicole Kindred: [phone]  In the event of inclement weather, please call our main line at [phone] for an update on the status of any delays or closures.  Dermatology Medication Tips: Please keep the boxes that topical medications come in in order to help keep track of the instructions about where and how to use these. Pharmacies typically print the medication instructions only on the boxes and not directly on the medication tubes.   If your medication is too expensive, please contact our office at [phone] option 4 or send [place] a message through [place].   We are unable to tell what your co-pay for medications will be in advance as this is different depending on your insurance coverage. However, we may be able to find a substitute medication at lower cost or fill out paperwork to get insurance to cover a needed medication.   If a prior authorization is required to get your medication covered by your insurance company, please allow [place] 1-2 business days to complete this process.  Drug prices often vary depending on where the prescription is filled and some pharmacies may offer cheaper prices.  The website www.goodrx.com contains coupons for medications through different pharmacies. The prices here do not account for what the cost may be with help from insurance (it may be cheaper  with your insurance), but the website can give you the price if you did not use any insurance.  - You can print the associated coupon and take it with your prescription to the pharmacy.  - You may also stop by our office during regular business hours and pick up a GoodRx coupon card.  - If you need your prescription sent electronically to a different pharmacy, notify our office through [facility] or by phone at [phone] option 4.     Si Usted Necesita Algo Despus de Su Visita  Tambin puede enviarnos un mensaje a travs de Pharmacist, community. Por lo general respondemos a los mensajes de MyChart en el transcurso de  1 a 2 das hbiles.  Para renovar recetas, por favor pida a su farmacia que se ponga en contacto con nuestra oficina. Harland Dingwall de fax es [place] [phone].  Si tiene un asunto urgente cuando la clnica est cerrada y que no puede esperar hasta el siguiente da hbil, puede llamar/localizar a su doctor(a) al nmero que aparece a continuacin.   Por favor, tenga en cuenta que aunque hacemos todo lo posible para estar disponibles para asuntos urgentes fuera del horario de [place], no estamos disponibles las 24 horas del da, los 7 das de la [place].   Si tiene un problema urgente y no puede comunicarse con nosotros, puede optar por buscar atencin mdica  en el consultorio de su doctor(a), en una clnica privada, en un centro de atencin urgente o en una sala de emergencias.  Si tiene Engineering geologist, por favor llame inmediatamente al 911 o vaya a la sala de emergencias.  Nmeros de bper  - Dr. Nehemiah Massed: [phone]  - Dra. Ulisses Vondrak: [phone]  - Dra. Nicole Kindred: [phone]  En caso de inclemencias del [place], por favor llame a Johnsie Kindred principal al [phone] para una actualizacin sobre el [place] de cualquier retraso o cierre.  Consejos para la medicacin en dermatologa: Por favor, guarde las cajas en las que vienen los medicamentos de uso tpico para  ayudarle a seguir las instrucciones sobre dnde y cmo usarlos. Las farmacias generalmente imprimen las instrucciones del medicamento slo en las cajas y no directamente en los tubos del [place].   Si su medicamento es muy caro, por favor, pngase en contacto con Zigmund Daniel llamando al [phone] y presione la opcin 4 o envenos un mensaje a travs de Pharmacist, community.   No podemos decirle cul ser su copago por los medicamentos por adelantado ya que esto es diferente dependiendo de la cobertura de su seguro. Sin embargo, es posible que podamos encontrar un medicamento sustituto a Electrical engineer un formulario para que el seguro cubra el medicamento que se considera necesario.   Si se requiere una autorizacin previa para que su compaa de seguros [place] su medicamento, por favor permtanos de 1 a 2 das hbiles para completar este proceso.  Los precios de los medicamentos varan con frecuencia dependiendo del Environmental consultant de dnde se surte la receta y alguna farmacias pueden ofrecer precios ms baratos.  El sitio web www.goodrx.com tiene cupones para medicamentos de Airline pilot. Los precios aqu no tienen en cuenta lo que podra costar con la ayuda del seguro (puede ser ms barato con su seguro), pero el sitio web puede darle el precio si no utiliz Research scientist (physical sciences).  - Puede imprimir el cupn correspondiente y llevarlo con su receta a la farmacia.  - Tambin puede pasar por nuestra oficina durante el horario de atencin regular y Charity fundraiser una tarjeta de cupones de GoodRx.  - Si necesita que su receta se enve electrnicamente a una farmacia diferente, informe a nuestra oficina a travs de MyChart de [place] o por telfono llamando al [phone] y presione la opcin 4.

## 2022-08-11 NOTE — Progress Notes (Signed)
Follow-Up Visit   Subjective  Christine Adams is a 42 y.o. female who presents for the following: Annual Exam (1 year tbse, hx of rosacea ).  Rosacea doing well.  The patient presents for Total-Body Skin Exam (TBSE) for skin cancer screening and mole check.  The patient has spots, moles and lesions to be evaluated, some may be new or changing and the patient has concerns that these could be cancer.   The following portions of the chart were reviewed this encounter and updated as appropriate:  Tobacco  Allergies  Meds  Problems  Med Hx  Surg Hx  Fam Hx      Review of Systems: No other skin or systemic complaints except as noted in HPI or Assessment and Plan.   Objective  Well appearing patient in no apparent distress; mood and affect are within normal limits.  A full examination was performed including scalp, head, eyes, ears, nose, lips, neck, chest, axillae, abdomen, back, buttocks, bilateral upper extremities, bilateral lower extremities, hands, feet, fingers, toes, fingernails, and toenails. All findings within normal limits unless otherwise noted below.  face mild mid-face erythema   right nasal ala Erythematous thin papules/macules with gritty scale.    Assessment & Plan  Rosacea face  Chronic condition with duration or expected duration over one year. Currently well-controlled.   Rosacea is a chronic progressive skin condition usually affecting the face of adults, causing redness and/or acne bumps. It is treatable but not curable. It sometimes affects the eyes (ocular rosacea) as well. It may respond to topical and/or systemic medication and can flare with stress, sun exposure, alcohol, exercise and some foods.  Daily application of broad spectrum spf 30+ sunscreen to face is recommended to reduce flares.   Continue [facility] metronidazole/ivermectin/azelaic acid twice daily as needed to affected areas on the face. The patient was advised this is not covered by  insurance since it is made by a compounding pharmacy. They will receive an email to check out and the medication will be mailed to their home.    Patient d/c'd Oracea / doxycycline 40 mg     Increase tretinion prescription tretinion 0.025 % to 0.05 % with hyaluronic acid sent through [facility]. Advised to use cautiously since sometimes can flare rosacea but she has tolerated in the past.   Topical retinoid medications like tretinoin/Retin-A, adapalene/Differin, tazarotene/Fabior, and Epiduo/Epiduo Forte can cause dryness and irritation when first started. Only apply a pea-sized amount to the entire affected area. Avoid applying it around the eyes, edges of mouth and creases at the nose. If you experience irritation, use a good moisturizer first and/or apply the medicine less often. If you are doing well with the medicine, you can increase how often you use it until you are applying every night. Be careful with sun protection while using this medication as it can make you sensitive to the sun. This medicine should not be used by pregnant women.   Actinic keratosis right nasal ala  Actinic keratoses are precancerous spots that appear secondary to cumulative UV radiation exposure/sun exposure over time. They are chronic with expected duration over 1 year. A portion of actinic keratoses will progress to squamous cell carcinoma of the skin. It is not possible to reliably predict which spots will progress to skin cancer and so treatment is recommended to prevent development of skin cancer.  Recommend daily broad spectrum sunscreen SPF 30+ to sun-exposed areas, reapply every 2 hours as needed.  Recommend staying in the  shade or wearing long sleeves, sun glasses (UVA+UVB protection) and wide brim hats (4-inch brim around the entire circumference of the hat). Call for new or changing lesions.  - Start 5-fluorouracil cream twice a day for 7 days to affected areas including right nose (nasal ala).   Reviewed course of treatment and expected reaction.  Patient advised to expect inflammation and crusting and advised that erosions are possible.  Patient advised to be diligent with sun protection during and after treatment. Handout with details of how to apply medication and what to expect provided. Counseled to keep medication out of reach of children and pets.  Rx sent to [facility]   Will recheck in 3 months  fluorouracil (EFUDEX) 5 % cream - right nasal ala Apply to right nasal ala bid for 7 days  Lentigines - Scattered tan macules - Due to sun exposure - Benign-appearing, observe - Recommend daily broad spectrum sunscreen SPF 30+ to sun-exposed areas, reapply every 2 hours as needed. - Call for any changes  Seborrheic Keratoses - Stuck-on, waxy, tan-brown papules and/or plaques  - Benign-appearing - Discussed benign etiology and prognosis. - Observe - Call for any changes  Melanocytic Nevi - Tan-brown and/or pink-flesh-colored symmetric macules and papules - Benign appearing on exam today - Observation - Call clinic for new or changing moles - Recommend daily use of broad spectrum spf 30+ sunscreen to sun-exposed areas.   Hemangiomas - Red papules - Discussed benign nature - Observe - Call for any changes  Dermatofibroma At arms  - Firm pink/brown papulenodule with dimple sign - Benign appearing - Call for any changes  Actinic Damage - chronic, secondary to cumulative UV radiation exposure/sun exposure over time - diffuse scaly erythematous macules with underlying dyspigmentation - Recommend daily broad spectrum sunscreen SPF 30+ to sun-exposed areas, reapply every 2 hours as needed.  - Recommend staying in the shade or wearing long sleeves, sun glasses (UVA+UVB protection) and wide brim hats (4-inch brim around the entire circumference of the hat). - Call for new or changing lesions.   Skin cancer screening performed today. Return for 3 month ak follow  up, 1 year tbse . I, Ruthell Rummage, CMA, am acting as scribe for Forest Gleason, MD.  Documentation: I have reviewed the above documentation for accuracy and completeness, and I agree with the above.  Forest Gleason, MD

## 2022-08-12 ENCOUNTER — Other Ambulatory Visit: Payer: Self-pay

## 2022-08-15 ENCOUNTER — Other Ambulatory Visit: Payer: Self-pay

## 2022-08-17 NOTE — Progress Notes (Signed)
Chief Complaint:   OBESITY Christine Adams is here to discuss her progress with her obesity treatment plan along with follow-up of her obesity related diagnoses. Christine Adams is on the Category 2 Plan with breakfast and lunch options and states she is following her eating plan approximately 50% of the time. Christine Adams states she is not exercising.  Today's visit was #: 15 Starting weight: 195 LBS Starting date: 08/04/2021 Today's weight: 166 LBS Today's date: 07/28/2022 Total lbs lost to date: 29 LBS Total lbs lost since last in-office visit: 4 LBS  Interim History: Patient is doing great no issues.  Subjective:   1. Other specified hypothyroidism Patient is asymptomatic and denies any issues or concerns.  Patient is on Synthroid 137 mcg daily.  Assessment/Plan:   Medications Discontinued During This Encounter  Medication Reason   Semaglutide-Weight Management 1 MG/0.5ML SOAJ Reorder     Meds ordered this encounter  Medications   Semaglutide-Weight Management 1 MG/0.5ML SOAJ    Sig: Inject 1 mg into the skin once a week.    Dispense:  2 mL    Refill:  0     1. Other specified hypothyroidism Patient labs are stable and energy is good.  2. Obesity with current BMI 27.8--starting bmi 32.45 Patient is tolerating medication well.  Constipation is currently controlled.  Increase exercise as tolerated.  Refill- Semaglutide-Weight Management 1 MG/0.5ML SOAJ; Inject 1 mg into the skin once a week.  Dispense: 2 mL; Refill: 0  Christine Adams is currently in the action stage of change. As such, her goal is to continue with weight loss efforts. She has agreed to the Category 2 Plan with breakfast and lunch options..   Exercise goals:  As is.  Behavioral modification strategies: planning for success.  Christine Adams has agreed to follow-up with our clinic in 4 weeks. She was informed of the importance of frequent follow-up visits to maximize her success with intensive lifestyle modifications for her multiple  health conditions.   Objective:   Blood pressure 130/89, pulse 60, temperature 97.9 F (36.6 C), height '5\' 5"'$  (1.651 m), weight 166 lb 12.8 oz (75.7 kg), SpO2 98 %, currently breastfeeding. Body mass index is 27.76 kg/m.  General: Cooperative, alert, well developed, in no acute distress. HEENT: Conjunctivae and lids unremarkable. Cardiovascular: Regular rhythm.  Lungs: Normal work of breathing. Neurologic: No focal deficits.   Lab Results  Component Value Date   CREATININE 0.71 06/02/2022   BUN 15 06/02/2022   NA 138 06/02/2022   K 4.5 06/02/2022   CL 103 06/02/2022   CO2 21 06/02/2022   Lab Results  Component Value Date   ALT 11 06/02/2022   AST 17 06/02/2022   ALKPHOS 97 06/02/2022   BILITOT 0.5 06/02/2022   Lab Results  Component Value Date   HGBA1C 5.1 06/02/2022   HGBA1C 5.1 08/04/2021   HGBA1C 5.2 12/06/2016   Lab Results  Component Value Date   INSULIN 18.6 06/02/2022   INSULIN 13.3 08/04/2021   Lab Results  Component Value Date   TSH 2.140 02/22/2022   Lab Results  Component Value Date   CHOL 162 06/02/2022   HDL 35 (L) 06/02/2022   LDLCALC 107 (H) 06/02/2022   LDLDIRECT 105.0 02/14/2020   TRIG 106 06/02/2022   CHOLHDL 4.6 (H) 06/02/2022   Lab Results  Component Value Date   VD25OH 59.1 06/02/2022   VD25OH 50.3 02/22/2022   VD25OH 50.3 02/22/2022   Lab Results  Component Value Date   WBC 6.4  06/02/2022   HGB 15.4 06/02/2022   HCT 46.1 06/02/2022   MCV 88 06/02/2022   PLT 267 06/02/2022   Lab Results  Component Value Date   IRON 87 12/09/2020   FERRITIN 54.9 12/09/2020   Attestation Statements:   Reviewed by clinician on day of visit: allergies, medications, problem list, medical history, surgical history, family history, social history, and previous encounter notes.  I, Davy Pique, RMA, am acting as Location manager for [employer], DO.  I have reviewed the above documentation for completeness, and I agree with the above.  Marjory Sneddon, D.O.  The [employer] was signed into law in 2016 which includes the topic of electronic health records.  This provides immediate access to information in MyChart.  This includes consultation notes, operative notes, office notes, lab results and pathology reports.  If you have any questions about what you read please let us know at your next visit so we can discuss your concerns and take corrective action if need be.  We are right here with you.

## 2022-08-19 ENCOUNTER — Other Ambulatory Visit: Payer: Self-pay

## 2022-08-22 ENCOUNTER — Other Ambulatory Visit: Payer: Self-pay

## 2022-08-22 MED FILL — Levothyroxine Sodium Tab 137 MCG: ORAL | 90 days supply | Qty: 90 | Fill #1 | Status: AC

## 2022-08-31 NOTE — Progress Notes (Unsigned)
  TeleHealth Visit:  This visit was completed with telemedicine (audio/video) technology. Erla has verbally consented to this TeleHealth visit. The patient is located at home, the provider is located at home. The participants in this visit include the listed provider and patient. The visit was conducted today via MyChart video.  OBESITY Stevee is here to discuss her progress with her obesity treatment plan along with follow-up of her obesity related diagnoses.   Today's visit was # 16 Starting weight: 195 lbs Starting date: 08/04/2021 Weight at last in office visit: 166 lbs on 07/28/22 Total weight loss: 29 lbs at last in office visit on 07/28/22. Today's reported weight: *** lbs No weight reported.  Nutrition Plan: the Category 2 plan and with breakfast and lunch options - ***% adherence.  Current exercise: {exercise types:16438} none  Interim History:  ***  Pharmacotherapy: Awa is on {dwwpharmacotherapy:29109} Adverse side effects: {dwwse:29122} Hunger is {EWCONTROLASSESSMENT:24261}.  Cravings are {EWCONTROLASSESSMENT:24261}.  Assessment/Plan:  1. ***  2. ***  3. ***  {dwwmorbid:29108::"Morbid Obesity"}: Current BMI *** Pharmacotherapy Plan {dwwmed:29123}  {dwwpharmacotherapy:29109} [place] {CHL AMB IS/IS NOT:210130109} currently in the action stage of change. As such, her goal is to {MWMwtloss#1:210800005}.  She has agreed to {dwwsldiets:29085}.  Exercise goals: {MWM EXERCISE RECS:23473}  Behavioral modification strategies: {dwwslwtlossstrategies:29088}.  Avenell has agreed to follow-up with our clinic in {NUMBER 1-10:22536} weeks.   No orders of the defined types were placed in this encounter.   There are no discontinued medications.   No orders of the defined types were placed in this encounter.     Objective:   VITALS: Per patient if applicable, see vitals. GENERAL: Alert and in no acute distress. CARDIOPULMONARY: No increased WOB. Speaking in  clear sentences.  PSYCH: Pleasant and cooperative. Speech normal rate and rhythm. Affect is appropriate. Insight and judgement are appropriate. Attention is focused, linear, and appropriate.  NEURO: Oriented as arrived to appointment on time with no prompting.   Attestation Statements:   Reviewed by clinician on day of visit: allergies, medications, problem list, medical history, surgical history, family history, social history, and previous encounter notes.  ***(delete if time-based billing not used) Time spent on visit including the items listed below was *** minutes.  -preparing to see the patient (e.g., review of tests, history, previous notes) -obtaining and/or reviewing separately obtained history -counseling and educating the patient/family/caregiver -documenting clinical information in the electronic or other health record -ordering medications, tests, or procedures -independently interpreting results and communicating results to the patient/ family/caregiver -referring and communicating with other health care professionals  -care coordination   This was prepared with the assistance of Presenter, broadcasting.  Occasional wrong-word or sound-a-like substitutions may have occurred due to the inherent limitations of voice recognition software.

## 2022-09-01 ENCOUNTER — Telehealth (INDEPENDENT_AMBULATORY_CARE_PROVIDER_SITE_OTHER): Payer: Commercial Managed Care - PPO | Admitting: Family Medicine

## 2022-09-01 ENCOUNTER — Other Ambulatory Visit: Payer: Self-pay

## 2022-09-01 ENCOUNTER — Encounter (INDEPENDENT_AMBULATORY_CARE_PROVIDER_SITE_OTHER): Payer: Self-pay | Admitting: Family Medicine

## 2022-09-01 DIAGNOSIS — E669 Obesity, unspecified: Secondary | ICD-10-CM | POA: Diagnosis not present

## 2022-09-01 DIAGNOSIS — Z6827 Body mass index (BMI) 27.0-27.9, adult: Secondary | ICD-10-CM | POA: Diagnosis not present

## 2022-09-01 DIAGNOSIS — E88819 Insulin resistance, unspecified: Secondary | ICD-10-CM | POA: Diagnosis not present

## 2022-09-01 MED ORDER — SEMAGLUTIDE-WEIGHT MANAGEMENT 1 MG/0.5ML ~~LOC~~ SOAJ
1.0000 mg | SUBCUTANEOUS | 0 refills | Status: DC
  Filled 2022-09-01 – 2022-09-19 (×2): qty 2, 28d supply, fill #0

## 2022-09-19 ENCOUNTER — Other Ambulatory Visit: Payer: Self-pay

## 2022-09-19 MED ORDER — AMOXICILLIN-POT CLAVULANATE 875-125 MG PO TABS
1.0000 | ORAL_TABLET | Freq: Two times a day (BID) | ORAL | 0 refills | Status: DC
  Filled 2022-09-19: qty 20, 10d supply, fill #0

## 2022-09-20 ENCOUNTER — Other Ambulatory Visit: Payer: Self-pay

## 2022-09-22 ENCOUNTER — Ambulatory Visit (INDEPENDENT_AMBULATORY_CARE_PROVIDER_SITE_OTHER): Payer: Commercial Managed Care - PPO | Admitting: Family Medicine

## 2022-09-29 ENCOUNTER — Ambulatory Visit (INDEPENDENT_AMBULATORY_CARE_PROVIDER_SITE_OTHER): Payer: Commercial Managed Care - PPO | Admitting: Family Medicine

## 2022-09-29 ENCOUNTER — Encounter (INDEPENDENT_AMBULATORY_CARE_PROVIDER_SITE_OTHER): Payer: Self-pay | Admitting: Family Medicine

## 2022-09-29 ENCOUNTER — Other Ambulatory Visit: Payer: Self-pay

## 2022-09-29 VITALS — BP 124/72 | HR 89 | Temp 99.0°F | Ht 65.0 in | Wt 166.0 lb

## 2022-09-29 DIAGNOSIS — E038 Other specified hypothyroidism: Secondary | ICD-10-CM

## 2022-09-29 DIAGNOSIS — Z6827 Body mass index (BMI) 27.0-27.9, adult: Secondary | ICD-10-CM

## 2022-09-29 DIAGNOSIS — E669 Obesity, unspecified: Secondary | ICD-10-CM | POA: Diagnosis not present

## 2022-09-29 DIAGNOSIS — F39 Unspecified mood [affective] disorder: Secondary | ICD-10-CM | POA: Diagnosis not present

## 2022-09-29 DIAGNOSIS — E66811 Obesity, class 1: Secondary | ICD-10-CM | POA: Insufficient documentation

## 2022-09-29 MED ORDER — SEMAGLUTIDE-WEIGHT MANAGEMENT 1 MG/0.5ML ~~LOC~~ SOAJ
1.0000 mg | SUBCUTANEOUS | 0 refills | Status: DC
  Filled 2022-09-29 – 2022-10-17 (×2): qty 2, 28d supply, fill #0

## 2022-09-29 NOTE — Progress Notes (Signed)
Christine Adams, D.O.  ABFM, ABOM Specializing in Clinical Bariatric Medicine  Office located at: [address] [place], [place]  [address]     Assessment and Plan:    Medications Discontinued During This Encounter  Medication Reason   Semaglutide-Weight Management 1 MG/0.5ML SOAJ Reorder     Meds ordered this encounter  Medications   Semaglutide-Weight Management 1 MG/0.5ML SOAJ    Sig: Inject 1 mg into the skin once a week.    Dispense:  2 mL    Refill:  0     Obesity with current BMI 27.8--starting bmi 32.45 Generalized obesity: Starting BMI 32.4 Assessment: Condition is Improving, but not optimized.. Biometric data collected today, was reviewed with patient.  Fat mass has decreased by .6lb. Muscle mass has remained the same. Total body water has remained the same  Plan: Continue category 2 meal plan  with breakfast and lunch options.  -Continue Semaglutide-Weight Management 1 MG once weekly (continue dose PNR). -Discussed possibly starting Topiramate later on. Patient declined starting it this visit.   Mood disorder (HCC) with emotional eating Assessment: Condition is not optimized.. Denies any SI/HI. Mood is stable. She reports experiencing some stressors with work and states she's been experiencing emotional eating.   Plan:  - Discussed possibly starting Topiramate later on. Patient declined starting it this visit. Discussed how thoughts affect eating habits, modeling of thoughts, feelings, and behaviors, and strategies for change.  Importance of not skipping meals and getting all her proteins and fiber in on a daily basis discussed.   - Discussed cognitive distortions, coping thoughts, and how to change our thoughts/ self talk regarding foods/ eating patterns. - No SI/ HI.  Mood stable currently - Behavior modification techniques were discussed today to help deal with emotional/ non-hunger eating behaviors including but not limited to exercise for stress  management, meditation/prayer, behavorial sessions with her therapist and self care activities like adequate sleep (7-9 hrs/nite). - Importance of following up with PCP and others was stressed  - Educational handouts given to pt at their request - Reminded patient of the importance of following their prudent nutrition plan and how food can affect mood as well to support emotional wellbeing.  - We will continue to monitor closely alongside PCP / other specialists.    Other specified hypothyroidism Assessment: Condition is At goal. Pt is asymptomatic, no concerns  Plan: Sx stable. Continue Synthroid 137 mcg daily.     TREATMENT PLAN FOR OBESITY:  Recommended Dietary Goals Christine Adams is currently in the action stage of change. As such, her goal is to continue weight management plan. She has agreed to continue the Category 2 Plan with breakfast and lunch options.  Behavioral Intervention Additional resources provided today: patient declined Evidence-based interventions for health behavior change were utilized today including the discussion of self monitoring techniques, problem-solving barriers and SMART goal setting techniques.   Regarding patient's less desirable eating habits and patterns, we employed the technique of small changes.  Pt will specifically work on:  pack lunches, and increasing exercise to 3 days a week for 20 minutes for next visit.    Recommended Physical Activity Goals Richa has been advised to work up to 150 minutes of moderate intensity aerobic activity a week and strengthening exercises 2-3 times per week for cardiovascular health, weight loss maintenance and preservation of muscle mass.  She has agreed to Continue current level of physical activity  and Think about ways to increase physical activity  Pharmacotherapy We  discussed various medication options to help Ceri with her weight loss efforts and we both agreed to continue Semaglutide 1 mg once  weekly.   FOLLOW UP: Return in about 4 weeks (around 10/27/2022). She was informed of the importance of frequent follow up visits to maximize her success with intensive lifestyle modifications for her multiple health conditions.    Subjective:   Chief complaint: Obesity Christine Adams is here to discuss her progress with her obesity treatment plan. She is on the the Category 2 Plan with breakfast and lunch options and states she is following her eating plan approximately 0% of the time. She states she is exercising 0 minutes 0 days per week.  Interval History:  Christine Adams is here for a follow up office visit. We reviewed her meal plan and all questions were answered. Patient's food recall appears to be accurate and consistent with what is on plan when she is following it. When eating on plan, her hunger and cravings are well controlled.     Since last office visit she reports not following her meal plan since her last visit. She states that she's been dealing with stress with work and has been stress eating. 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 She states that she has not been exercising as often as she would like however, she continues to do some exercises. She states that she's had 1 dose of Semaglutide 1 mg once a month. She tends to take her dose when she notices she is been emotionally eating. She reports that she currently has no therapist.   Pharmacotherapy for weight loss: She is currently taking Semaglutide-Weight Management 1 MG  once monthly for medical weight loss.  Denies side effects.    Review of Systems:  Pertinent positives were addressed with patient today.  Weight Summary and Biometrics   Weight Lost Since Last Visit: 0  Weight Gained Since Last Visit: 0    Vitals Temp: 99 F (37.2 C) BP: 124/72 Pulse Rate: 89 SpO2: 96 %   Anthropometric Measurements Height:  (1.651 m) Weight: 166 lb (75.3 kg) BMI  (Calculated): 27.62 Weight at Last Visit: 166lb Weight Lost Since Last Visit: 0 Weight Gained Since Last Visit: 0 Starting Weight: 195lb Total Weight Loss (lbs): 29 lb (13.2 kg) Peak Weight: 199lb   Body Composition  Body Fat %: 35.1 % Fat Mass (lbs): 58.2 lbs Muscle Mass (lbs): 102.4 lbs Total Body Water (lbs): 72.2 lbs Visceral Fat Rating : 6   Other Clinical Data Fasting: no Labs: no Today's Visit #: 17 Starting Date: 08/04/21    Objective:   PHYSICAL EXAM:  Blood pressure 124/72, pulse 89, temperature 99 F (37.2 C), height  (1.651 m), weight 166 lb (75.3 kg), SpO2 96 %, currently breastfeeding. Body mass index is 27.62 kg/m.  General: Well Developed, well nourished, and in no acute distress.  HEENT: Normocephalic, atraumatic Skin: Warm and dry, cap RF less 2 sec, good turgor Chest:  Normal excursion, shape, no gross abn Respiratory: speaking in full sentences, no conversational dyspnea NeuroM-Sk: Ambulates w/o assistance, moves * 4 Psych: A and O *3, insight good, mood-full  DIAGNOSTIC DATA REVIEWED:  BMET    Component Value Date/Time   NA 138 06/02/2022 0803   K 4.5 06/02/2022 0803   CL 103 06/02/2022 0803   CO2 21 06/02/2022 0803   GLUCOSE 84 06/02/2022 0803   GLUCOSE 81 12/09/2020 0844  BUN 15 06/02/2022 0803   CREATININE 0.71 06/02/2022 0803   CALCIUM 9.1 06/02/2022 0803   Lab Results  Component Value Date   HGBA1C 5.1 06/02/2022   HGBA1C 5.2 12/06/2016   Lab Results  Component Value Date   INSULIN 18.6 06/02/2022   INSULIN 13.3 08/04/2021   Lab Results  Component Value Date   TSH 2.140 02/22/2022   CBC    Component Value Date/Time   WBC 6.4 06/02/2022 0803   WBC 7.2 12/09/2020 0844   RBC 5.24 06/02/2022 0803   RBC 5.10 12/09/2020 0844   HGB 15.4 06/02/2022 0803   HCT 46.1 06/02/2022 0803   PLT 267 06/02/2022 0803   MCV 88 06/02/2022 0803   MCH 29.4 06/02/2022 0803   MCH 30.0 10/14/2019 0526   MCHC 33.4 06/02/2022 0803    MCHC 34.1 12/09/2020 0844   RDW 12.6 06/02/2022 0803   Iron Studies    Component Value Date/Time   IRON 87 12/09/2020 0844   FERRITIN 54.9 12/09/2020 0844   IRONPCTSAT 27.9 12/09/2020 0844   Lipid Panel     Component Value Date/Time   CHOL 162 06/02/2022 0803   TRIG 106 06/02/2022 0803   HDL 35 (L) 06/02/2022 0803   CHOLHDL 4.6 (H) 06/02/2022 0803   CHOLHDL 5 02/14/2020 1215   VLDL 46.8 (H) 02/14/2020 1215   LDLCALC 107 (H) 06/02/2022 0803   LDLDIRECT 105.0 02/14/2020 1215   Hepatic Function Panel     Component Value Date/Time   PROT 7.1 06/02/2022 0803   ALBUMIN 4.6 06/02/2022 0803   AST 17 06/02/2022 0803   ALT 11 06/02/2022 0803   ALKPHOS 97 06/02/2022 0803   BILITOT 0.5 06/02/2022 0803      Component Value Date/Time   TSH 2.140 02/22/2022 1304   Nutritional Lab Results  Component Value Date   VD25OH 59.1 06/02/2022   VD25OH 50.3 02/22/2022   VD25OH 50.3 02/22/2022    Attestations:   Reviewed by clinician on day of visit: allergies, medications, problem list, medical history, surgical history, family history, social history, and previous encounter notes.  This encounter took 40 total minutes of time including any pre-visit and post-visit time spent on this date of service only, including taking a thorough history, reviewing any labs and/or imaging, reviewing prior notes, counseling the patient, coordinating care as well as documenting in the electronic health record on the date of service.  I,Safa M Kadhim,acting as a scribe for [employer], DO.,have documented all relevant documentation on the behalf of Thomasene Lot, DO,as directed by  Thomasene Lot, DO while in the presence of Thomasene Lot, DO.   I, Thomasene Lot, DO, have reviewed all documentation for this visit. The documentation on 09/29/22 for the exam, diagnosis, procedures, and orders are all accurate and complete.

## 2022-10-17 ENCOUNTER — Other Ambulatory Visit: Payer: Self-pay

## 2022-10-25 ENCOUNTER — Other Ambulatory Visit: Payer: Self-pay

## 2022-10-25 DIAGNOSIS — F4322 Adjustment disorder with anxiety: Secondary | ICD-10-CM | POA: Diagnosis not present

## 2022-10-25 DIAGNOSIS — F902 Attention-deficit hyperactivity disorder, combined type: Secondary | ICD-10-CM | POA: Diagnosis not present

## 2022-10-25 MED ORDER — DEXTROAMPHETAMINE SULFATE ER 10 MG PO CP24
10.0000 mg | ORAL_CAPSULE | Freq: Every morning | ORAL | 0 refills | Status: DC
  Filled 2022-12-06: qty 30, 30d supply, fill #0

## 2022-10-25 MED ORDER — DEXTROAMPHETAMINE SULFATE ER 10 MG PO CP24
10.0000 mg | ORAL_CAPSULE | Freq: Every morning | ORAL | 0 refills | Status: DC
  Filled 2023-01-04: qty 30, 30d supply, fill #0

## 2022-10-25 MED ORDER — DEXTROAMPHETAMINE SULFATE ER 10 MG PO CP24
10.0000 mg | ORAL_CAPSULE | Freq: Every morning | ORAL | 0 refills | Status: DC
  Filled 2022-10-25: qty 30, 30d supply, fill #0

## 2022-10-25 MED ORDER — SERTRALINE HCL 50 MG PO TABS
75.0000 mg | ORAL_TABLET | Freq: Every day | ORAL | 2 refills | Status: DC
  Filled 2022-10-25: qty 45, 30d supply, fill #0
  Filled 2022-12-06: qty 45, 30d supply, fill #1
  Filled 2023-01-04: qty 45, 30d supply, fill #2

## 2022-11-02 NOTE — Progress Notes (Deleted)
Carlye Grippe, D.O.  ABFM, ABOM Specializing in Clinical Bariatric Medicine  Office located at: [address] [place], [place]  [address]     Assessment and Plan:   No orders of the defined types were placed in this encounter.   There are no discontinued medications.   No orders of the defined types were placed in this encounter.    *** 1. Mood disorder (HCC)  2. Insulin resistance  3. Vitamin D deficiency  4. Obesity with starting bmi 32.45 on date 08/04/21- current BMI:  5. BMI 27.0-27.9,adult      TREATMENT PLAN FOR OBESITY: Assessment:  Christine Adams is here to discuss her progress with her obesity treatment plan along with follow-up of her obesity related diagnoses. See Medical Weight Management Flowsheet for complete bioelectrical impedance results.  Condition is {docourse:29403:::1}. {BiometricData (Optional):29179}  Since last office visit on *** patient's Fat mass has {DID:29233} by ***lb. Muscle mass has {DID:29233} by ***lb. Total body water has {DID:29233} by ***lb.  Counseling done on how various foods will affect these numbers and how to maximize success  Current Nutrition Plan: ***  Current Activity Level consists of: ***  Today's visit was #: *** Starting weight: *** Starting date: *** Weight change since last visit: ***  (ie:   + 5 lb) Total lbs lost to date: *** Total weight loss percentage to date: *** ( use https://www.[employer].si )   Plan:  Christine Adams is currently in the action stage of change. As such, her goal is to continue her weight management plan. Christine Adams will work on healthier eating habits and try to follow the {mealplan:29239} best they can.   Behavioral Intervention Additional resources provided today: {weightresources:29185} Evidence-based interventions for health behavior change were utilized today including the discussion of self monitoring techniques, problem-solving barriers and  SMART goal setting techniques.   Regarding patient's less desirable eating habits and patterns, we employed the technique of small changes.  Pt will specifically work on: *** for next visit.    Recommended Physical Activity Goals  Christine Adams has been advised to slowly work up to 150 minutes of moderate intensity aerobic activity Adams week and strengthening exercises 2-3 times per week for cardiovascular health, weight loss maintenance and preservation of muscle mass.   She has agreed to {EMEXERCISE:28847}  Pharmacotherapy Current Anti-obesity medications: ***. Reported side effects: ***. We discussed various medication options to help Roya with her weight loss efforts and we both agreed to ***.( or Patient prefers to not start any weight loss medications at this time.   FOLLOW UP: No follow-ups on file.*** She was informed of the importance of frequent follow up visits to maximize her success with intensive lifestyle modifications for her multiple health conditions.    Subjective:   Chief complaint: Obesity Christine Adams is here to discuss her progress with her obesity treatment plan. She is on the {MWMwtlossportion/plan2:23431} and states she is following her eating plan approximately ***% of the time. She states she is exercising *** minutes *** days per week.  Interval History:  Christine Adams is here for Adams follow up office visit.   This is patient's *** office visit with [place].    Since last office visit:  ***  We reviewed her meal plan and all questions were answered. Patient's food recall appears to be accurate and consistent with what is on plan when she is following it. When eating on plan, her hunger and cravings are well controlled.      Pharmacotherapy for  weight loss: She {srtis (Optional):29129} currently taking {srtpreviousweightlossmeds (Optional):29124} for medical weight loss.  Denies side effects.    Review of Systems:  Pertinent positives were addressed with patient  today.    Weight Summary and Biometrics   No data recorded No data recorded ***  No data recorded No data recorded No data recorded No data recorded   Objective:   PHYSICAL EXAM: currently breastfeeding. There is no height or weight on file to calculate BMI.  General: Well Developed, well nourished, and in no acute distress.  HEENT: Normocephalic, atraumatic Skin: Warm and dry, cap RF less 2 sec, good turgor Chest:  Normal excursion, shape, no gross abn Respiratory: speaking in full sentences, no conversational dyspnea NeuroM-Sk: Ambulates w/o assistance, moves * 4 Psych: Adams and O *3, insight good, mood-full  DIAGNOSTIC DATA REVIEWED:  BMET    Component Value Date/Time   NA 138 06/02/2022 0803   K 4.5 06/02/2022 0803   CL 103 06/02/2022 0803   CO2 21 06/02/2022 0803   GLUCOSE 84 06/02/2022 0803   GLUCOSE 81 12/09/2020 0844   BUN 15 06/02/2022 0803   CREATININE 0.71 06/02/2022 0803   CALCIUM 9.1 06/02/2022 0803   Lab Results  Component Value Date   HGBA1C 5.1 06/02/2022   HGBA1C 5.2 12/06/2016   Lab Results  Component Value Date   INSULIN 18.6 06/02/2022   INSULIN 13.3 08/04/2021   Lab Results  Component Value Date   TSH 2.140 02/22/2022   CBC    Component Value Date/Time   WBC 6.4 06/02/2022 0803   WBC 7.2 12/09/2020 0844   RBC 5.24 06/02/2022 0803   RBC 5.10 12/09/2020 0844   HGB 15.4 06/02/2022 0803   HCT 46.1 06/02/2022 0803   PLT 267 06/02/2022 0803   MCV 88 06/02/2022 0803   MCH 29.4 06/02/2022 0803   MCH 30.0 10/14/2019 0526   MCHC 33.4 06/02/2022 0803   MCHC 34.1 12/09/2020 0844   RDW 12.6 06/02/2022 0803   Iron Studies    Component Value Date/Time   IRON 87 12/09/2020 0844   FERRITIN 54.9 12/09/2020 0844   IRONPCTSAT 27.9 12/09/2020 0844   Lipid Panel     Component Value Date/Time   CHOL 162 06/02/2022 0803   TRIG 106 06/02/2022 0803   HDL 35 (L) 06/02/2022 0803   CHOLHDL 4.6 (H) 06/02/2022 0803   CHOLHDL 5 02/14/2020  1215   VLDL 46.8 (H) 02/14/2020 1215   LDLCALC 107 (H) 06/02/2022 0803   LDLDIRECT 105.0 02/14/2020 1215   Hepatic Function Panel     Component Value Date/Time   PROT 7.1 06/02/2022 0803   ALBUMIN 4.6 06/02/2022 0803   AST 17 06/02/2022 0803   ALT 11 06/02/2022 0803   ALKPHOS 97 06/02/2022 0803   BILITOT 0.5 06/02/2022 0803      Component Value Date/Time   TSH 2.140 02/22/2022 1304   Nutritional Lab Results  Component Value Date   VD25OH 59.1 06/02/2022   VD25OH 50.3 02/22/2022   VD25OH 50.3 02/22/2022    Attestations:   Reviewed by clinician on day of visit: allergies, medications, problem list, medical history, surgical history, family history, social history, and previous encounter notes.   Patient was in the office today and time spent on visit including pre-visit chart review and post-visit care/coordination of care and electronic medical record documentation was *** minutes. 50% of the time was in face to face counseling of this patient's medical condition(s) and providing education on treatment options to include  the first-line treatment of diet and lifestyle modification.   I,Devereaux Grayson,acting as Adams Neurosurgeon for [employer], DO.,have documented all relevant documentation on the behalf of Thomasene Lot, DO,as directed by  Thomasene Lot, DO while in the presence of Thomasene Lot, DO.   I, Thomasene Lot, DO, have reviewed all documentation for this visit. The documentation on 11/02/22 for the exam, diagnosis, procedures, and orders are all accurate and complete.

## 2022-11-03 ENCOUNTER — Encounter (INDEPENDENT_AMBULATORY_CARE_PROVIDER_SITE_OTHER): Payer: Self-pay | Admitting: Family Medicine

## 2022-11-03 ENCOUNTER — Other Ambulatory Visit: Payer: Self-pay

## 2022-11-03 ENCOUNTER — Ambulatory Visit (INDEPENDENT_AMBULATORY_CARE_PROVIDER_SITE_OTHER): Payer: Commercial Managed Care - PPO | Admitting: Family Medicine

## 2022-11-03 VITALS — BP 124/85 | HR 64 | Temp 98.0°F | Ht 65.0 in | Wt 166.0 lb

## 2022-11-03 DIAGNOSIS — E669 Obesity, unspecified: Secondary | ICD-10-CM | POA: Diagnosis not present

## 2022-11-03 DIAGNOSIS — Z6827 Body mass index (BMI) 27.0-27.9, adult: Secondary | ICD-10-CM | POA: Diagnosis not present

## 2022-11-03 DIAGNOSIS — E88819 Insulin resistance, unspecified: Secondary | ICD-10-CM

## 2022-11-03 DIAGNOSIS — F39 Unspecified mood [affective] disorder: Secondary | ICD-10-CM

## 2022-11-03 DIAGNOSIS — E559 Vitamin D deficiency, unspecified: Secondary | ICD-10-CM

## 2022-11-03 MED ORDER — SEMAGLUTIDE-WEIGHT MANAGEMENT 1 MG/0.5ML ~~LOC~~ SOAJ
1.0000 mg | SUBCUTANEOUS | 0 refills | Status: AC
Start: 2022-11-03 — End: 2022-12-03
  Filled 2022-11-03: qty 2, 28d supply, fill #0

## 2022-11-03 NOTE — Progress Notes (Signed)
Christine Adams, D.O.  ABFM, ABOM Specializing in Clinical Bariatric Medicine  Office located at: [address] [place], [place]  [address]     Assessment and Plan:    Medications Discontinued During This Encounter  Medication Reason   amoxicillin-clavulanate (AUGMENTIN) 875-125 MG tablet Completed Course   COVID-19 mRNA vaccine 2023-2024 (COMIRNATY) syringe Completed Course   Semaglutide-Weight Management 1 MG/0.5ML SOAJ Reorder     Meds ordered this encounter  Medications   Semaglutide-Weight Management 1 MG/0.5ML SOAJ    Sig: Inject 1 mg into the skin once a week.    Dispense:  2 mL    Refill:  0     Mood disorder (HCC) Assessment: Condition is improving, but not optimized.  - Denies any SI/HI. Mood is stable. 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  - She is compliant and tolerant with Zoloft 75 mg daily as recommended by Dr.Akintayo. Denies any side effects.  Plan:  - Continue with med as prescribed by specialist.  - Discussed importance of not skipping meals and getting all her proteins and fiber in on a daily basis discussed.   - Reminded patient of the importance of following their prudent nutrition plan and how food can affect mood as well to support emotional wellbeing.    Insulin resistance Assessment: Condition is not optimized. Lab Results  Component Value Date   HGBA1C 5.1 06/02/2022   HGBA1C 5.1 08/04/2021   HGBA1C 5.2 12/06/2016   INSULIN 18.6 06/02/2022   INSULIN 13.3 08/04/2021  - She is not on any medication for this condition. This is diet controlled. - Her hunger and cravings are controlled when eating on plan.   Plan:.  - Continue to decrease simple carbs/ sugars; increase fiber and proteins -> follow her meal plan.   - Christine Adams will continue to work on weight loss, exercise, via their meal plan we devised to help decrease the risk of progressing to diabetes.    Vitamin D deficiency Assessment:  Condition is at goal Lab Results  Component Value Date   VD25OH 59.1 06/02/2022   VD25OH 50.3 02/22/2022   VD25OH 50.3 02/22/2022  - No issues with OTC Cholecalciferol 1,000 units TID. Denies any side effects.  Plan: - Continue with OTC med.  - weight loss will likely improve availability of vitamin D, thus encouraged Christine Adams to continue with meal plan and their weight loss efforts to further improve this condition.     TREATMENT PLAN FOR OBESITY: BMI 27.0-27.9,adult Obesity with current BMI 27.--starting bmi 32.45/date 08/04/21 Assessment:  Christine Adams is here to discuss her progress with her obesity treatment plan along with follow-up of her obesity related diagnoses. See Medical Weight Management Flowsheet for complete bioelectrical impedance results.  Condition is improving, but not optimized. Biometric data collected today, was reviewed with patient.   Since last office visit on 09/29/2022 patient's Fat mass has decreased by 0.4 lb. Muscle mass has increased by 0.6 lb. Total body water has not changed.  Counseling done on how various foods will affect these numbers and how to maximize success  Today's visit was #: 18 Starting weight: 195 lbs Starting date: 08/04/21 Weight change since last visit: 0  (ie:   + 5 lb) Total lbs lost to date: 29 Total weight loss percentage to date: 14.87%   No issues with Semaglutide 1 mg weekly, denies any side effects. She has not had trouble eating any of the foods on the meal plan.  Plan:  - Continue with med as prescribed.  - I emphasized the importance of journaling for increased mindfulness and awareness of how many days she is on goal.  - I explained the Food Journaling plan information handout to the patient and showed her how to journal her foods on Lose It.  - Unless pre-existing renal or cardiopulmonary conditions exist which patient was told to limit their fluid intake by another provider, I recommended roughly one half of their weight  in pounds, to be the approximate ounces of non-caloric, non-caffeinated beverages they should drink per day; including more if they are engaging in exercise.  - Christine Adams will work on healthier eating habits and continue the Category 2 plan with breakfast and lunch options and begin journaling 1200-1300 calories and 85+ grams of protein.    Behavioral Intervention Additional resources provided today: Food journaling plan information and patient declined Evidence-based interventions for health behavior change were utilized today including the discussion of self monitoring techniques, problem-solving barriers and SMART goal setting techniques.   Regarding patient's less desirable eating habits and patterns, we employed the technique of small changes.  Pt will specifically work on: not skipping any meals, continue current exercise regiment, and journaling her intake  for next visit.    Recommended Physical Activity Goals Christine Adams has been advised to slowly work up to 150 minutes of moderate intensity aerobic activity a week and strengthening exercises 2-3 times per week for cardiovascular health, weight loss maintenance and preservation of muscle mass.   She has agreed to Continue current level of physical activity   Pharmacotherapy Current Anti-obesity medications: Semaglutide 1 mg weekly . Reported side effects: None.  FOLLOW UP: Return in about 4 weeks (around 12/01/2022). She was informed of the importance of frequent follow up visits to maximize her success with intensive lifestyle modifications for her multiple health conditions.   Subjective:   Chief complaint: Obesity Christine Adams is here to discuss her progress with her obesity treatment plan. She is on the the Category 2 Plan with breakfast and lunch options and states she is following her eating plan approximately 50% of the time. She states she is exercising 30 minutes 3 days per week.  Interval History:  Christine Adams is here for a follow up  office visit. This is patient's 18 office visit with [place].   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  - Sometimes she endorses over snacking and being too hard on herself.  - Reports that she eats Breakfast at around 11 am and Lunch at 1 pm.  - She has been doing band-exercises at her work for 5 minutes.   Pharmacotherapy for weight loss: She is currently taking  Semaglutide  for medical weight loss.  Denies side effects.    Review of Systems:  Pertinent positives were addressed with patient today.   Weight Summary and Biometrics   Weight Lost Since Last Visit: 0  Weight Gained Since Last Visit: 0    Vitals Temp: 98 F (36.7 C) BP: 124/85 Pulse Rate: 64 SpO2: 97 %   Anthropometric Measurements Height: 5\' 5"  (1.651 m) Weight: 166 lb (75.3 kg) BMI (Calculated): 27.62 Weight at Last Visit: 166 lb Weight Lost Since Last Visit: 0 Weight Gained Since Last Visit: 0 Starting Weight: 195 lb Total Weight Loss (lbs): 29 lb (13.2 kg) Peak Weight: 199 lb   Body Composition  Body Fat %: 34.7 % Fat Mass (lbs): 57.8 lbs  Muscle Mass (lbs): 103 lbs Total Body Water (lbs): 72.2 lbs Visceral Fat Rating : 6   Other Clinical Data Fasting: No Labs: No Today's Visit #: 18 Starting Date: 08/04/21   Objective:   PHYSICAL EXAM: Blood pressure 124/85, pulse 64, temperature 98 F (36.7 C), height 5\' 5"  (1.651 m), weight 166 lb (75.3 kg), SpO2 97 %, currently breastfeeding. Body mass index is 27.62 kg/m.  General: Well Developed, well nourished, and in no acute distress.  HEENT: Normocephalic, atraumatic Skin: Warm and dry, cap RF less 2 sec, good turgor Chest:  Normal excursion, shape, no gross abn Respiratory: speaking in full sentences, no conversational dyspnea NeuroM-Sk: Ambulates w/o assistance, moves * 4 Psych: A and O *3, insight good, mood-full  DIAGNOSTIC DATA REVIEWED:  BMET    Component Value Date/Time   NA 138  06/02/2022 0803   K 4.5 06/02/2022 0803   CL 103 06/02/2022 0803   CO2 21 06/02/2022 0803   GLUCOSE 84 06/02/2022 0803   GLUCOSE 81 12/09/2020 0844   BUN 15 06/02/2022 0803   CREATININE 0.71 06/02/2022 0803   CALCIUM 9.1 06/02/2022 0803   Lab Results  Component Value Date   HGBA1C 5.1 06/02/2022   HGBA1C 5.2 12/06/2016   Lab Results  Component Value Date   INSULIN 18.6 06/02/2022   INSULIN 13.3 08/04/2021   Lab Results  Component Value Date   TSH 2.140 02/22/2022   CBC    Component Value Date/Time   WBC 6.4 06/02/2022 0803   WBC 7.2 12/09/2020 0844   RBC 5.24 06/02/2022 0803   RBC 5.10 12/09/2020 0844   HGB 15.4 06/02/2022 0803   HCT 46.1 06/02/2022 0803   PLT 267 06/02/2022 0803   MCV 88 06/02/2022 0803   MCH 29.4 06/02/2022 0803   MCH 30.0 10/14/2019 0526   MCHC 33.4 06/02/2022 0803   MCHC 34.1 12/09/2020 0844   RDW 12.6 06/02/2022 0803   Iron Studies    Component Value Date/Time   IRON 87 12/09/2020 0844   FERRITIN 54.9 12/09/2020 0844   IRONPCTSAT 27.9 12/09/2020 0844   Lipid Panel     Component Value Date/Time   CHOL 162 06/02/2022 0803   TRIG 106 06/02/2022 0803   HDL 35 (L) 06/02/2022 0803   CHOLHDL 4.6 (H) 06/02/2022 0803   CHOLHDL 5 02/14/2020 1215   VLDL 46.8 (H) 02/14/2020 1215   LDLCALC 107 (H) 06/02/2022 0803   LDLDIRECT 105.0 02/14/2020 1215   Hepatic Function Panel     Component Value Date/Time   PROT 7.1 06/02/2022 0803   ALBUMIN 4.6 06/02/2022 0803   AST 17 06/02/2022 0803   ALT 11 06/02/2022 0803   ALKPHOS 97 06/02/2022 0803   BILITOT 0.5 06/02/2022 0803      Component Value Date/Time   TSH 2.140 02/22/2022 1304   Nutritional Lab Results  Component Value Date   VD25OH 59.1 06/02/2022   VD25OH 50.3 02/22/2022   VD25OH 50.3 02/22/2022    Attestations:   Reviewed by clinician on day of visit: allergies, medications, problem list, medical history, surgical history, family history, social history, and previous encounter  notes.   I,Special Puri,acting as a Neurosurgeon for [employer], DO.,have documented all relevant documentation on the behalf of Thomasene Lot, DO,as directed by  Thomasene Lot, DO while in the presence of Thomasene Lot, DO.   I, Thomasene Lot, DO, have reviewed all documentation for this visit. The documentation on 11/03/22 for the exam, diagnosis, procedures, and orders are all accurate  and complete.

## 2022-11-09 ENCOUNTER — Other Ambulatory Visit: Payer: Self-pay

## 2022-11-17 ENCOUNTER — Encounter: Payer: Self-pay | Admitting: Dermatology

## 2022-11-17 ENCOUNTER — Ambulatory Visit: Payer: Commercial Managed Care - PPO | Admitting: Dermatology

## 2022-11-17 VITALS — BP 126/88 | HR 61

## 2022-11-17 DIAGNOSIS — L570 Actinic keratosis: Secondary | ICD-10-CM

## 2022-11-17 DIAGNOSIS — D2339 Other benign neoplasm of skin of other parts of face: Secondary | ICD-10-CM

## 2022-11-17 DIAGNOSIS — D369 Benign neoplasm, unspecified site: Secondary | ICD-10-CM

## 2022-11-17 DIAGNOSIS — X32XXXA Exposure to sunlight, initial encounter: Secondary | ICD-10-CM | POA: Diagnosis not present

## 2022-11-17 DIAGNOSIS — L578 Other skin changes due to chronic exposure to nonionizing radiation: Secondary | ICD-10-CM

## 2022-11-17 DIAGNOSIS — W908XXA Exposure to other nonionizing radiation, initial encounter: Secondary | ICD-10-CM

## 2022-11-17 NOTE — Patient Instructions (Addendum)
- Re-Start 5-fluorouracil cream twice a day for 10 days to affected areas including right nose (nasal ala).  Reviewed course of treatment and expected reaction.  Patient advised to expect inflammation and crusting and advised that erosions are possible.  Patient advised to be diligent with sun protection during and after treatment. Handout with details of how to apply medication and what to expect provided. Counseled to keep medication out of reach of children and pets.      Recommend daily broad spectrum sunscreen SPF 30+ to sun-exposed areas, reapply every 2 hours as needed. Call for new or changing lesions.  Staying in the shade or wearing long sleeves, sun glasses (UVA+UVB protection) and wide brim hats (4-inch brim around the entire circumference of the hat) are also recommended for sun protection.    Due to recent changes in healthcare laws, you may see results of your pathology and/or laboratory studies on MyChart before the doctors have had a chance to review them. We understand that in some cases there may be results that are confusing or concerning to you. Please understand that not all results are received at the same time and often the doctors may need to interpret multiple results in order to provide you with the best plan of care or course of treatment. Therefore, we ask that you please give [place] 2 business days to thoroughly review all your results before contacting the office for clarification. Should we see a critical lab result, you will be contacted sooner.   If You Need Anything After Your Visit  If you have any questions or concerns for your doctor, please call our main line at [phone] and press option 4 to reach your doctor's medical assistant. If no one answers, please leave a voicemail as directed and we will return your call as soon as possible. Messages left after 4 pm will be answered the following business day.   You may also send [place] a message via MyChart. We typically  respond to MyChart messages within 1-2 business days.  For prescription refills, please ask your pharmacy to contact our office. Our fax number is [phone].  If you have an urgent issue when the clinic is closed that cannot wait until the next business day, you can page your doctor at the number below.    Please note that while we do our best to be available for urgent issues outside of office hours, we are not available 24/7.   If you have an urgent issue and are unable to reach [place], you may choose to seek medical care at your doctor's office, retail clinic, urgent care center, or emergency room.  If you have a medical emergency, please immediately call 911 or go to the emergency department.  Pager Numbers  - Dr. Gwen Pounds: [phone]  - Dr. Neale Burly: [phone]  - Dr. Roseanne Reno: [phone]  In the event of inclement weather, please call our main line at [phone] for an update on the status of any delays or closures.  Dermatology Medication Tips: Please keep the boxes that topical medications come in in order to help keep track of the instructions about where and how to use these. Pharmacies typically print the medication instructions only on the boxes and not directly on the medication tubes.   If your medication is too expensive, please contact our office at [phone] option 4 or send [place] a message through MyChart.   We are unable to tell what your co-pay for medications will be in advance as this  is different depending on your insurance coverage. However, we may be able to find a substitute medication at lower cost or fill out paperwork to get insurance to cover a needed medication.   If a prior authorization is required to get your medication covered by your insurance company, please allow [place] 1-2 business days to complete this process.  Drug prices often vary depending on where the prescription is filled and some pharmacies may offer cheaper prices.  The website  www.goodrx.com contains coupons for medications through different pharmacies. The prices here do not account for what the cost may be with help from insurance (it may be cheaper with your insurance), but the website can give you the price if you did not use any insurance.  - You can print the associated coupon and take it with your prescription to the pharmacy.  - You may also stop by our office during regular business hours and pick up a GoodRx coupon card.  - If you need your prescription sent electronically to a different pharmacy, notify our office through [facility] or by phone at [phone] option 4.     Si Usted Necesita Algo Despus de Su Visita  Tambin puede enviarnos un mensaje a travs de Clinical cytogeneticist. Por lo general respondemos a los mensajes de MyChart en el transcurso de 1 a 2 das hbiles.  Para renovar recetas, por favor pida a su farmacia que se ponga en contacto con nuestra oficina. Annie Sable de fax es [place] [phone].  Si tiene un asunto urgente cuando la clnica est cerrada y que no puede esperar hasta el siguiente da hbil, puede llamar/localizar a su doctor(a) al nmero que aparece a continuacin.   Por favor, tenga en cuenta que aunque hacemos todo lo posible para estar disponibles para asuntos urgentes fuera del horario de [place], no estamos disponibles las 24 horas del da, los 7 [address]  [address] de la [place].   Si tiene un problema urgente y no puede comunicarse con nosotros, puede optar por buscar atencin mdica  en el consultorio de su doctor(a), en una clnica privada, en un centro de atencin urgente o en una sala de emergencias.  Si tiene Engineer, drilling, por favor llame inmediatamente al 911 o vaya a la sala de emergencias.  Nmeros de bper  - Dr. Gwen Pounds: [phone]  - Dra. Moye: [phone]  - Dra. Roseanne Reno: [phone]  En caso de inclemencias del [place], por favor llame a Lacy Duverney principal al [phone] para una actualizacin  sobre el [place] de cualquier retraso o cierre.  Consejos para la medicacin en dermatologa: Por favor, guarde las cajas en las que vienen los medicamentos de uso tpico para ayudarle a seguir las instrucciones sobre dnde y cmo usarlos. Las farmacias generalmente imprimen las instrucciones del medicamento slo en las cajas y no directamente en los tubos del [place].   Si su medicamento es muy caro, por favor, pngase en contacto con Rolm Gala llamando al [phone] y presione la opcin 4 o envenos un mensaje a travs de Clinical cytogeneticist.   No podemos decirle cul ser su copago por los medicamentos por adelantado ya que esto es diferente dependiendo de la cobertura de su seguro. Sin embargo, es posible que podamos encontrar un medicamento sustituto a Audiological scientist un formulario para que el seguro cubra el medicamento que se considera necesario.   Si se requiere una autorizacin previa para que su compaa de seguros [place] su medicamento, por favor permtanos de 1 a 2 das hbiles para  completar [employer].  Los precios de los medicamentos varan con frecuencia dependiendo del Environmental consultant de dnde se surte la receta y alguna farmacias pueden ofrecer precios ms baratos.  El sitio web www.goodrx.com tiene cupones para medicamentos de Health and safety inspector. Los precios aqu no tienen en cuenta lo que podra costar con la ayuda del seguro (puede ser ms barato con su seguro), pero el sitio web puede darle el precio si no utiliz Tourist information centre manager.  - Puede imprimir el cupn correspondiente y llevarlo con su receta a la farmacia.  - Tambin puede pasar por nuestra oficina durante el horario de atencin regular y Education officer, museum una tarjeta de cupones de GoodRx.  - Si necesita que su receta se enve electrnicamente a una farmacia diferente, informe a nuestra oficina a travs de MyChart de [place] o por telfono llamando al [phone] y presione la opcin 4.

## 2022-11-17 NOTE — Progress Notes (Signed)
   Follow-Up Visit   Subjective  Christine Adams is a 42 y.o. female who presents for the following: AK follow up. Right nasal ala. Tx with 5FU/Calcipotriene cream as directed. Patient states area around the hypopigmentation became red but not the central lesion.   The patient has spots, moles and lesions to be evaluated, some may be new or changing and the patient has concerns that these could be cancer.    The following portions of the chart were reviewed this encounter and updated as appropriate: medications, allergies, medical history  Review of Systems:  No other skin or systemic complaints except as noted in HPI or Assessment and Plan.  Objective  Well appearing patient in no apparent distress; mood and affect are within normal limits.  A focused examination was performed of the following areas: Face  Relevant physical exam findings are noted in the Assessment and Plan.     Assessment & Plan   ACTINIC KERATOSIS Exam: gritty pink papule without features suspicious for malignancy on dermoscopy  Actinic keratoses are precancerous spots that appear secondary to cumulative UV radiation exposure/sun exposure over time. They are chronic with expected duration over 1 year. A portion of actinic keratoses will progress to squamous cell carcinoma of the skin. It is not possible to reliably predict which spots will progress to skin cancer and so treatment is recommended to prevent development of skin cancer.  Recommend daily broad spectrum sunscreen SPF 30+ to sun-exposed areas, reapply every 2 hours as needed.  Recommend staying in the shade or wearing long sleeves, sun glasses (UVA+UVB protection) and wide brim hats (4-inch brim around the entire circumference of the hat). Call for new or changing lesions. Treatment Plan: Re-Start 5-fluorouracil cream twice a day for 10 days to affected areas including right nose (nasal ala).  Reviewed course of treatment and expected reaction.  Patient  advised to expect inflammation and crusting and advised that erosions are possible.  Patient advised to be diligent with sun protection during and after treatment. Handout with details of how to apply medication and what to expect provided. Counseled to keep medication out of reach of children and pets.   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  Observation.  Call clinic for new or changing lesions.   ACTINIC DAMAGE - chronic, secondary to cumulative UV radiation exposure/sun exposure over time - diffuse scaly erythematous macules with underlying dyspigmentation - Recommend daily broad spectrum sunscreen SPF 30+ to sun-exposed areas, reapply every 2 hours as needed.  - Recommend staying in the shade or wearing long sleeves, sun glasses (UVA+UVB protection) and wide brim hats (4-inch brim around the entire circumference of the hat). - Call for new or changing lesions. - Recommend taking Heliocare sun protection supplement daily in sunny weather for additional sun protection. For maximum protection on the sunniest days, you can take up to 2 capsules of regular Heliocare OR take 1 capsule of Heliocare Ultra.    Return in about 3 months (around 02/17/2023) for AK Follow Up.  I, Lawson Radar, CMA, am acting as scribe for Darden Dates, MD.   Documentation: I have reviewed the above documentation for accuracy and completeness, and I agree with the above.  Darden Dates, MD

## 2022-11-25 ENCOUNTER — Other Ambulatory Visit: Payer: Self-pay

## 2022-12-06 ENCOUNTER — Ambulatory Visit (INDEPENDENT_AMBULATORY_CARE_PROVIDER_SITE_OTHER): Payer: Commercial Managed Care - PPO | Admitting: Family Medicine

## 2022-12-06 ENCOUNTER — Other Ambulatory Visit: Payer: Self-pay

## 2022-12-06 ENCOUNTER — Telehealth (INDEPENDENT_AMBULATORY_CARE_PROVIDER_SITE_OTHER): Payer: Self-pay | Admitting: Family Medicine

## 2022-12-06 ENCOUNTER — Encounter (INDEPENDENT_AMBULATORY_CARE_PROVIDER_SITE_OTHER): Payer: Self-pay | Admitting: Family Medicine

## 2022-12-06 VITALS — BP 124/84 | HR 72 | Temp 98.3°F | Ht 65.0 in | Wt 166.0 lb

## 2022-12-06 DIAGNOSIS — Z6827 Body mass index (BMI) 27.0-27.9, adult: Secondary | ICD-10-CM

## 2022-12-06 DIAGNOSIS — E88819 Insulin resistance, unspecified: Secondary | ICD-10-CM | POA: Diagnosis not present

## 2022-12-06 DIAGNOSIS — E559 Vitamin D deficiency, unspecified: Secondary | ICD-10-CM

## 2022-12-06 DIAGNOSIS — E669 Obesity, unspecified: Secondary | ICD-10-CM

## 2022-12-06 DIAGNOSIS — F39 Unspecified mood [affective] disorder: Secondary | ICD-10-CM | POA: Diagnosis not present

## 2022-12-06 MED FILL — Levothyroxine Sodium Tab 137 MCG: ORAL | 90 days supply | Qty: 90 | Fill #2 | Status: AC

## 2022-12-06 NOTE — Progress Notes (Signed)
Christine Adams, D.O.  ABFM, ABOM Specializing in Clinical Bariatric Medicine  Office located at: [address] [place], [place]  [address]     Assessment and Plan:   Mood disorder [facility]) Assessment: Condition is well controlled. Denies any SI/HI. Mood is stable. No issues with Zoloft 75 mg daily and Adderall 10 mg daily as prescribed by Dr.Akintayo. Denies any adverse effects.  Plan: Continue with mood medicines as recommended by specialist. Continue her prudent nutritional plan that is low in simple carbohydrates, saturated fats and trans fats to goal of 5-10% weight loss to achieve significant health benefits.  Pt encouraged to continually advance exercise and cardiovascular fitness as tolerated throughout weight loss journey. We will continue to monitor closely alongside PCP / other specialists.    Insulin resistance Assessment: A1c levels are stable, however Insulin levels are almost 4 times normal. This condition is diet and exercise controlled. Her hunger/cravings are well controlled when eating all the food on the meal plan.   Lab Results  Component Value Date   HGBA1C 5.1 06/02/2022   HGBA1C 5.1 08/04/2021   HGBA1C 5.2 12/06/2016   INSULIN 18.6 06/02/2022   INSULIN 13.3 08/04/2021    Plan: Continue her prudent nutritional plan that is low in simple carbohydrates, saturated fats and trans fats to goal of 5-10% weight loss to achieve significant health benefits.  Pt encouraged to continually advance exercise and cardiovascular fitness as tolerated throughout weight loss journey.  We will recheck A1c and fasting insulin level in approximately 3 months from last check, or as deemed appropriate.    Vitamin D deficiency Assessment: Condition is at goal. She has been compliant and tolerant with OTC Cholecalciferol 1,000 units TID. Denies any adverse effects.   Lab Results  Component Value Date   VD25OH 59.1 06/02/2022   VD25OH 50.3 02/22/2022   VD25OH 50.3 02/22/2022    Plan: Continue with OTC supplement. Will continue to monitor levels regularly (every 3-4 mo on average) to keep levels within normal limits and prevent over supplementation.   TREATMENT PLAN FOR OBESITY: BMI 27.0-27.9,adult Obesity with current BMI 27.--starting bmi 32.45/date 08/04/21 Assessment:  Christine Adams is here to discuss her progress with her obesity treatment plan along with follow-up of her obesity related diagnoses. See Medical Weight Management Flowsheet for complete bioelectrical impedance results.  Condition is stable. Biometric data collected today, was reviewed with patient.   Since last office visit on 11/03/22 patient's  Muscle mass has increased by 0.2 lb. Fat mass has decreased by 0.6 lb. Total body water has increased by 1.2 lb.  Counseling done on how various foods will affect these numbers and how to maximize success  Total lbs lost to date: 29 Total weight loss percentage to date: 14.87   Plan:  - Continue the Category 2 plan with breakfast and lunch options and continue journaling 1200-1300 calories and 85+ grams of protein.    Behavioral Intervention Additional resources provided today: patient declined Evidence-based interventions for health behavior change were utilized today including the discussion of self monitoring techniques, problem-solving barriers and SMART goal setting techniques.   Regarding patient's less desirable eating habits and patterns, we employed the technique of small changes.  Pt will specifically work on: continue with exercise regiment, mindful eating and purposeful exercise for next visit.    Recommended Physical Activity Goals  Christine Adams has been advised to slowly work up to 150 minutes of moderate intensity aerobic activity a week and strengthening exercises 2-3 times per  week for cardiovascular health, weight loss maintenance and preservation of muscle mass.   She has agreed to Continue current level of physical activity and advance as  tolerated  FOLLOW UP: Return in about 4 weeks (around 01/03/2023). She was informed of the importance of frequent follow up visits to maximize her success with intensive lifestyle modifications for her multiple health conditions.   Subjective:   Chief complaint: Obesity Christine Adams is here to discuss her progress with her obesity treatment plan. She is on the the Category 2 Plan and keeping a food journal and adhering to recommended goals of 1200-1300 calories and 85+  protein and states she is following her eating plan approximately 50 % of the time. She states she is  doing a variety of exercises 30 minutes 5 days per week.  Interval History:  Christine Adams is here for a follow up office visit.     Since last office visit:    - Christine Adams has been doing well and recently purchased a new horse.   - She has been practicing more mindful eating, such as taking smaller bites of deserts.   - She reports taking her Semaglutide only about once a month now and is saving her doses for the future.  ( Loss of insurance coverage)  She is tolerating the medication well.  No concerns  - Christine Adams notes that she is satisfied with her current weight.   - Her mood has been stable.   Pharmacotherapy for weight loss: She is currently taking  Semaglutide  for medical weight loss.  Denies side effects.    Review of Systems:  Pertinent positives were addressed with patient today.  Weight Summary and Biometrics   Weight Lost Since Last Visit: 0  Weight Gained Since Last Visit: 0   Vitals Temp: 98.3 F (36.8 C) BP: 124/84 Pulse Rate: 72 SpO2: 97 %   Anthropometric Measurements Height: 5\' 5"  (1.651 m) Weight: 166 lb (75.3 kg) BMI (Calculated): 27.62 Weight at Last Visit: 166lb Weight Lost Since Last Visit: 0 Weight Gained Since Last Visit: 0 Starting Weight: 195lb Total Weight Loss (lbs): 29 lb (13.2 kg) Peak Weight: 199lb   Body Composition  Body Fat %: 34.4 % Fat Mass (lbs): 57.2 lbs Muscle  Mass (lbs): 103.2 lbs Total Body Water (lbs): 73.4 lbs Visceral Fat Rating : 6   Other Clinical Data Fasting: no Labs: no Today's Visit #: 19 Starting Date: 08/04/21   Objective:   PHYSICAL EXAM: Blood pressure 124/84, pulse 72, temperature 98.3 F (36.8 C), height 5\' 5"  (1.651 m), weight 166 lb (75.3 kg), SpO2 97 %, currently breastfeeding. Body mass index is 27.62 kg/m.  General: Well Developed, well nourished, and in no acute distress.  HEENT: Normocephalic, atraumatic Skin: Warm and dry, cap RF less 2 sec, good turgor Chest:  Normal excursion, shape, no gross abn Respiratory: speaking in full sentences, no conversational dyspnea NeuroM-Sk: Ambulates w/o assistance, moves * 4 Psych: A and O *3, insight good, mood-full  DIAGNOSTIC DATA REVIEWED:  BMET    Component Value Date/Time   NA 138 06/02/2022 0803   K 4.5 06/02/2022 0803   CL 103 06/02/2022 0803   CO2 21 06/02/2022 0803   GLUCOSE 84 06/02/2022 0803   GLUCOSE 81 12/09/2020 0844   BUN 15 06/02/2022 0803   CREATININE 0.71 06/02/2022 0803   CALCIUM 9.1 06/02/2022 0803   Lab Results  Component Value Date   HGBA1C 5.1 06/02/2022   HGBA1C 5.2 12/06/2016   Lab Results  Component Value Date   INSULIN 18.6 06/02/2022   INSULIN 13.3 08/04/2021   Lab Results  Component Value Date   TSH 2.140 02/22/2022   CBC    Component Value Date/Time   WBC 6.4 06/02/2022 0803   WBC 7.2 12/09/2020 0844   RBC 5.24 06/02/2022 0803   RBC 5.10 12/09/2020 0844   HGB 15.4 06/02/2022 0803   HCT 46.1 06/02/2022 0803   PLT 267 06/02/2022 0803   MCV 88 06/02/2022 0803   MCH 29.4 06/02/2022 0803   MCH 30.0 10/14/2019 0526   MCHC 33.4 06/02/2022 0803   MCHC 34.1 12/09/2020 0844   RDW 12.6 06/02/2022 0803   Iron Studies    Component Value Date/Time   IRON 87 12/09/2020 0844   FERRITIN 54.9 12/09/2020 0844   IRONPCTSAT 27.9 12/09/2020 0844   Lipid Panel     Component Value Date/Time   CHOL 162 06/02/2022 0803    TRIG 106 06/02/2022 0803   HDL 35 (L) 06/02/2022 0803   CHOLHDL 4.6 (H) 06/02/2022 0803   CHOLHDL 5 02/14/2020 1215   VLDL 46.8 (H) 02/14/2020 1215   LDLCALC 107 (H) 06/02/2022 0803   LDLDIRECT 105.0 02/14/2020 1215   Hepatic Function Panel     Component Value Date/Time   PROT 7.1 06/02/2022 0803   ALBUMIN 4.6 06/02/2022 0803   AST 17 06/02/2022 0803   ALT 11 06/02/2022 0803   ALKPHOS 97 06/02/2022 0803   BILITOT 0.5 06/02/2022 0803      Component Value Date/Time   TSH 2.140 02/22/2022 1304   Nutritional Lab Results  Component Value Date   VD25OH 59.1 06/02/2022   VD25OH 50.3 02/22/2022   VD25OH 50.3 02/22/2022    Attestations:   Reviewed by clinician on day of visit: allergies, medications, problem list, medical history, surgical history, family history, social history, and previous encounter notes.    I, Special Randolm Idol, acting as a Stage manager for [employer], DO., have compiled all relevant documentation for today's office visit on behalf of Thomasene Lot, DO, while in the presence of [employer], DO.   I have reviewed the above documentation for accuracy and completeness, and I agree with the above. Christine Adams, D.O.   The 21st Century Cures Act was signed into law in 2016 which includes the topic of electronic health records.  This provides immediate access to information in MyChart.  This includes consultation notes, operative notes, office notes, lab results and pathology reports.  If you have any questions about what you read please let us know at your next visit so we can discuss your concerns and take corrective action if need be.  We are right here with you.

## 2022-12-22 ENCOUNTER — Other Ambulatory Visit: Payer: Self-pay

## 2022-12-22 MED ORDER — FLUCONAZOLE 150 MG PO TABS
150.0000 mg | ORAL_TABLET | ORAL | 0 refills | Status: DC
  Filled 2022-12-22: qty 3, 9d supply, fill #0

## 2023-01-05 ENCOUNTER — Ambulatory Visit (INDEPENDENT_AMBULATORY_CARE_PROVIDER_SITE_OTHER): Payer: Commercial Managed Care - PPO | Admitting: Family Medicine

## 2023-01-05 ENCOUNTER — Encounter (INDEPENDENT_AMBULATORY_CARE_PROVIDER_SITE_OTHER): Payer: Self-pay | Admitting: Family Medicine

## 2023-01-05 ENCOUNTER — Other Ambulatory Visit: Payer: Self-pay

## 2023-01-05 VITALS — BP 127/85 | HR 69 | Temp 98.2°F | Ht 65.0 in | Wt 169.0 lb

## 2023-01-05 DIAGNOSIS — E88819 Insulin resistance, unspecified: Secondary | ICD-10-CM | POA: Diagnosis not present

## 2023-01-05 DIAGNOSIS — E559 Vitamin D deficiency, unspecified: Secondary | ICD-10-CM | POA: Diagnosis not present

## 2023-01-05 DIAGNOSIS — Z6827 Body mass index (BMI) 27.0-27.9, adult: Secondary | ICD-10-CM

## 2023-01-05 DIAGNOSIS — E7849 Other hyperlipidemia: Secondary | ICD-10-CM | POA: Diagnosis not present

## 2023-01-05 DIAGNOSIS — Z6828 Body mass index (BMI) 28.0-28.9, adult: Secondary | ICD-10-CM

## 2023-01-05 DIAGNOSIS — E669 Obesity, unspecified: Secondary | ICD-10-CM

## 2023-01-05 NOTE — Progress Notes (Signed)
Christine Adams, D.O.  ABFM, ABOM Specializing in Clinical Bariatric Medicine  Office located at: [address] [place], [place]  [address]     Assessment and Plan:   Medications Discontinued During This Encounter  Medication Reason   amphetamine-dextroamphetamine (ADDERALL XR) 10 MG 24 hr capsule    amphetamine-dextroamphetamine (ADDERALL XR) 10 MG 24 hr capsule    amphetamine-dextroamphetamine (ADDERALL XR) 10 MG 24 hr capsule    amphetamine-dextroamphetamine (ADDERALL XR) 10 MG 24 hr capsule    amphetamine-dextroamphetamine (ADDERALL XR) 10 MG 24 hr capsule    amphetamine-dextroamphetamine (ADDERALL XR) 10 MG 24 hr capsule    amphetamine-dextroamphetamine (ADDERALL XR) 10 MG 24 hr capsule    amphetamine-dextroamphetamine (ADDERALL XR) 10 MG 24 hr capsule    sertraline (ZOLOFT) 50 MG tablet    sertraline (ZOLOFT) 50 MG tablet    sertraline (ZOLOFT) 50 MG tablet    sertraline (ZOLOFT) 50 MG tablet    temazepam (RESTORIL) 7.5 MG capsule    dextroamphetamine (DEXEDRINE SPANSULE) 10 MG 24 hr capsule    dextroamphetamine (DEXEDRINE SPANSULE) 10 MG 24 hr capsule      Insulin resistance Assessment: Not optimized. Goal is HgbA1c < 5.7, fasting insulin closer to 5.  Medication: None. Condition is being diet/exercise controlled. Her hunger and cravings are pretty well controlled when following her prescribed meal plan.   Lab Results  Component Value Date   HGBA1C 5.1 06/02/2022   HGBA1C 5.1 08/04/2021   HGBA1C 5.2 12/06/2016   INSULIN 18.6 06/02/2022   INSULIN 13.3 08/04/2021    Plan:  She will continue to focus on protein-rich, low simple carbohydrate foods. We will continue to monitor condition alongside PCP as it relates to her weight loss journey.    Vitamin D deficiency Assessment: Her Vitamin D deficiency is being treated with OTC Vitamin D 1,000 units TID and she is tolerating supplement well. No concerns in this regard today.   Lab Results  Component Value  Date   VD25OH 59.1 06/02/2022   VD25OH 50.3 02/22/2022   VD25OH 50.3 02/22/2022   Plan: Pt advised to maintain with OTC Cholecalciferol at current dose. Ashni will continue  with meal plan and their weight loss efforts to further improve this condition.     Other hyperlipidemia-with increased TG Assessment: Condition is not optimized and is diet/exercise controlled- pt not on any statin therapy. I briefly went over these results from 06/02/2022. Specifically, I reminded pt that her HDL level is not optimal. Goal is >39. I discussed that this level can improve with increased aerboic activity.   Lab Results  Component Value Date   CHOL 162 06/02/2022   HDL 35 (L) 06/02/2022   LDLCALC 107 (H) 06/02/2022   LDLDIRECT 105.0 02/14/2020   TRIG 106 06/02/2022   CHOLHDL 4.6 (H) 06/02/2022   Plan: Continue our treatment plan of a heart-heathy, low cholesterol meal plan. We recommend: aerobic activity with eventual goal of a minimum of 150+ min wk plus 2 days/ week of resistance or strength training. We will continue routine screening as patient continues to achieve health goals along their weight loss journey     TREATMENT PLAN FOR OBESITY: BMI 27.0-27.9,adult- current BMI 28.12 Obesity-starting bmi 32.45/date 08/04/21 Assessment: Christine Adams is here to discuss her progress with her obesity treatment plan along with follow-up of her obesity related diagnoses. See Medical Weight Management Flowsheet for complete bioelectrical impedance results.  Condition is not optimized. Biometric data collected today, was reviewed with patient.  Since last office visit on 12/06/22 patient's  Muscle mass has decreased by 0.6 lb. Fat mass has increased by 4 lb. Total body water has increased by 1 lb.  Counseling done on how various foods will affect these numbers and how to maximize success  Total lbs lost to date: 26  Total weight loss percentage to date: 13.33   Plan: Continue the Category 2 plan with  breakfast and lunch options and continue journaling 1200-1300 calories and 85+ grams of protein.   Behavioral Intervention Additional resources provided today: patient declined Evidence-based interventions for health behavior change were utilized today including the discussion of self monitoring techniques, problem-solving barriers and SMART goal setting techniques.   Regarding patient's less desirable eating habits and patterns, we employed the technique of small changes.  Pt will specifically work on: continuing with meal plan/current exercise regiment and being more consistent with taking Semaglutide every 2 wks for next visit.    Recommended Physical Activity Goals  Addaline has been advised to slowly work up to 150 minutes of moderate intensity aerobic activity a week and strengthening exercises 2-3 times per week for cardiovascular health, weight loss maintenance and preservation of muscle mass.   She has agreed to Continue current level of physical activity   FOLLOW UP: Return in about 6 weeks (around 02/16/2023). She was informed of the importance of frequent follow up visits to maximize her success with intensive lifestyle modifications for her multiple health conditions.  Subjective:   Chief complaint: Obesity [name] is here to discuss her progress with her obesity treatment plan. She is on  the Category 2 Plan and keeping a food journal and adhering to recommended goals of 1200-1300 calories and 85+ protein and states she is following her eating plan approximately 50% of the time. She states she is exercising 20 minutes 3 days per week.  Interval History:  Christine Adams is here for a follow up office visit. Since last OV, [name] has been doing well. She went to [place] for a 10 day vacation and endorses eating some off plan foods like ice-cream. She reports taking Semaglutide once a month and that the medication effects last roughly 2 wks. Her mood has been stable. Hunger is pretty  well controlled.  Review of Systems:  Pertinent positives were addressed with patient today.  Reviewed by clinician on day of visit: allergies, medications, problem list, medical history, surgical history, family history, social history, and previous encounter notes.  Weight Summary and Biometrics   Weight Lost Since Last Visit: 0lb  Weight Gained Since Last Visit: 3lb    Vitals Temp: 98.2 F (36.8 C) BP: 127/85 Pulse Rate: 69 SpO2: 96 %   Anthropometric Measurements Height: 5\' 5"  (1.651 m) Weight: 169 lb (76.7 kg) BMI (Calculated): 28.12 Weight at Last Visit: 166lb Weight Lost Since Last Visit: 0lb Weight Gained Since Last Visit: 3lb Starting Weight: 195lb Total Weight Loss (lbs): 26 lb (11.8 kg) Peak Weight: 199lb   Body Composition  Body Fat %: 36.1 % Fat Mass (lbs): 61.2 lbs Muscle Mass (lbs): 102.8 lbs Total Body Water (lbs): 74.4 lbs Visceral Fat Rating : 6   Other Clinical Data Fasting: no Labs: no Today's Visit #: 20 Starting Date: 08/04/21   Objective:   PHYSICAL EXAM: Blood pressure 127/85, pulse 69, temperature 98.2 F (36.8 C), height 5\' 5"  (1.651 m), weight 169 lb (76.7 kg), SpO2 96%, currently breastfeeding. Body mass index is 28.12 kg/m.  General: Well Developed, well nourished, and in no  acute distress.  HEENT: Normocephalic, atraumatic Skin: Warm and dry, cap RF less 2 sec, good turgor Chest:  Normal excursion, shape, no gross abn Respiratory: speaking in full sentences, no conversational dyspnea NeuroM-Sk: Ambulates w/o assistance, moves * 4 Psych: A and O *3, insight good, mood-full  DIAGNOSTIC DATA REVIEWED:  BMET    Component Value Date/Time   NA 138 06/02/2022 0803   K 4.5 06/02/2022 0803   CL 103 06/02/2022 0803   CO2 21 06/02/2022 0803   GLUCOSE 84 06/02/2022 0803   GLUCOSE 81 12/09/2020 0844   BUN 15 06/02/2022 0803   CREATININE 0.71 06/02/2022 0803   CALCIUM 9.1 06/02/2022 0803   Lab Results  Component Value  Date   HGBA1C 5.1 06/02/2022   HGBA1C 5.2 12/06/2016   Lab Results  Component Value Date   INSULIN 18.6 06/02/2022   INSULIN 13.3 08/04/2021   Lab Results  Component Value Date   TSH 2.140 02/22/2022   CBC    Component Value Date/Time   WBC 6.4 06/02/2022 0803   WBC 7.2 12/09/2020 0844   RBC 5.24 06/02/2022 0803   RBC 5.10 12/09/2020 0844   HGB 15.4 06/02/2022 0803   HCT 46.1 06/02/2022 0803   PLT 267 06/02/2022 0803   MCV 88 06/02/2022 0803   MCH 29.4 06/02/2022 0803   MCH 30.0 10/14/2019 0526   MCHC 33.4 06/02/2022 0803   MCHC 34.1 12/09/2020 0844   RDW 12.6 06/02/2022 0803   Iron Studies    Component Value Date/Time   IRON 87 12/09/2020 0844   FERRITIN 54.9 12/09/2020 0844   IRONPCTSAT 27.9 12/09/2020 0844   Lipid Panel     Component Value Date/Time   CHOL 162 06/02/2022 0803   TRIG 106 06/02/2022 0803   HDL 35 (L) 06/02/2022 0803   CHOLHDL 4.6 (H) 06/02/2022 0803   CHOLHDL 5 02/14/2020 1215   VLDL 46.8 (H) 02/14/2020 1215   LDLCALC 107 (H) 06/02/2022 0803   LDLDIRECT 105.0 02/14/2020 1215   Hepatic Function Panel     Component Value Date/Time   PROT 7.1 06/02/2022 0803   ALBUMIN 4.6 06/02/2022 0803   AST 17 06/02/2022 0803   ALT 11 06/02/2022 0803   ALKPHOS 97 06/02/2022 0803   BILITOT 0.5 06/02/2022 0803      Component Value Date/Time   TSH 2.140 02/22/2022 1304   Nutritional Lab Results  Component Value Date   VD25OH 59.1 06/02/2022   VD25OH 50.3 02/22/2022   VD25OH 50.3 02/22/2022    Attestations:   I, Special Puri , acting as a Stage manager for [employer], DO., have compiled all relevant documentation for today's office visit on behalf of Thomasene Lot, DO, while in the presence of [employer], DO.  I have reviewed the above documentation for accuracy and completeness, and I agree with the above. Christine Adams, D.O.  The 21st Century Cures Act was signed into law in 2016 which includes the topic of electronic health  records.  This provides immediate access to information in MyChart.  This includes consultation notes, operative notes, office notes, lab results and pathology reports.  If you have any questions about what you read please let us know at your next visit so we can discuss your concerns and take corrective action if need be.  We are right here with you.

## 2023-01-19 ENCOUNTER — Other Ambulatory Visit: Payer: Self-pay

## 2023-01-19 DIAGNOSIS — F902 Attention-deficit hyperactivity disorder, combined type: Secondary | ICD-10-CM | POA: Diagnosis not present

## 2023-01-19 DIAGNOSIS — F4322 Adjustment disorder with anxiety: Secondary | ICD-10-CM | POA: Diagnosis not present

## 2023-01-19 MED ORDER — DEXTROAMPHETAMINE SULFATE ER 10 MG PO CP24
10.0000 mg | ORAL_CAPSULE | Freq: Every day | ORAL | 0 refills | Status: DC
  Filled 2023-04-05: qty 30, 30d supply, fill #0

## 2023-01-19 MED ORDER — DEXTROAMPHETAMINE SULFATE ER 10 MG PO CP24: 10.0000 mg | ORAL_CAPSULE | Freq: Every day | ORAL | 0 refills | Status: DC

## 2023-01-19 MED ORDER — SERTRALINE HCL 50 MG PO TABS
75.0000 mg | ORAL_TABLET | Freq: Every day | ORAL | 2 refills | Status: DC
  Filled 2023-01-19 – 2023-02-02 (×2): qty 45, 30d supply, fill #0
  Filled 2023-04-05: qty 45, 30d supply, fill #1

## 2023-01-19 MED ORDER — DEXTROAMPHETAMINE SULFATE ER 10 MG PO CP24
10.0000 mg | ORAL_CAPSULE | Freq: Every day | ORAL | 0 refills | Status: DC
  Filled 2023-01-19: qty 30, 30d supply, fill #0

## 2023-02-02 ENCOUNTER — Other Ambulatory Visit: Payer: Self-pay

## 2023-02-10 ENCOUNTER — Other Ambulatory Visit: Payer: Self-pay

## 2023-02-16 ENCOUNTER — Ambulatory Visit (INDEPENDENT_AMBULATORY_CARE_PROVIDER_SITE_OTHER): Payer: Commercial Managed Care - PPO | Admitting: Family Medicine

## 2023-02-16 ENCOUNTER — Ambulatory Visit: Payer: Commercial Managed Care - PPO | Admitting: Dermatology

## 2023-02-16 ENCOUNTER — Encounter (INDEPENDENT_AMBULATORY_CARE_PROVIDER_SITE_OTHER): Payer: Self-pay | Admitting: Family Medicine

## 2023-02-16 ENCOUNTER — Telehealth (INDEPENDENT_AMBULATORY_CARE_PROVIDER_SITE_OTHER): Payer: Commercial Managed Care - PPO | Admitting: Family Medicine

## 2023-02-16 DIAGNOSIS — R632 Polyphagia: Secondary | ICD-10-CM | POA: Diagnosis not present

## 2023-02-16 DIAGNOSIS — Z6828 Body mass index (BMI) 28.0-28.9, adult: Secondary | ICD-10-CM | POA: Diagnosis not present

## 2023-02-16 DIAGNOSIS — E669 Obesity, unspecified: Secondary | ICD-10-CM | POA: Diagnosis not present

## 2023-02-16 NOTE — Progress Notes (Signed)
  TeleHealth Visit:  This visit was completed with telemedicine (audio/video) technology. Christine Adams has verbally consented to this TeleHealth visit. The patient is located at home, the provider is located at home. The participants in this visit include the listed provider and patient. The visit was conducted today via MyChart video.  OBESITY Christine Adams is here to discuss her progress with her obesity treatment plan along with follow-up of her obesity related diagnoses.   Today's visit was # 21 Starting weight: 195 lbs Starting date: 08/04/21 Weight at last in office visit: 169 lbs on 01/05/23 Total weight loss: 26 lbs at last in office visit on 01/05/23. Today's reported weight (02/16/23):  166 lbs  Nutrition Plan: the Category 2 plan, keeping a food journal with goal of 1200-1300 calories and 85+ grams of protein daily, and with breakfast and lunch options  Current exercise:  walking/cardio 15-60 minutes 5 days per week. Active at home.  Interim History:  She is ill with URI so we are doing a video visit. She is not journaling but she is following the category 2 plan. She takes Wegovy 1 mg every 3 weeks. Sometimes has nausea and constipation.   She is now back on plan. Gained 3 lbs last OV due to vacation but has since lost the weight. She is choosing protein first and making healthy choices.  Goal weight is 150 (24 BMI).  Assessment/Plan:  1. Polyphagia Currently this is moderately controlled. Medication(s): Wegovy 1.0 mg SQ, takes about every 3 weeks.   Reported side effects: Constipation, nausea. She has a "stockpile" of Wegovy left.  She no longer has insurance coverage for [employer].  Still has several injections. It helps with food noise.  She takes it every 3 weeks to conserve her supply. Is needed increasing fiber to manage constipation.  Has Zofran for nausea.  Plan: Continue Wegovy 1.0 mg SQ every 3 weeks.  2. Generalized Obesity: Current BMI 28 Christine Adams is currently in the  action stage of change. As such, her goal is to continue with weight loss efforts.  She has agreed to the Category 2 plan and with breakfast and lunch options.  Exercise goals:  as is  Behavioral modification strategies: increasing lean protein intake, decreasing simple carbohydrates , and planning for success.  Christine Adams has agreed to follow-up with our clinic in 6 weeks with fasting labs.  No orders of the defined types were placed in this encounter.   There are no discontinued medications.   No orders of the defined types were placed in this encounter.     Objective:   VITALS: Per patient if applicable, see vitals. GENERAL: Alert and in no acute distress. CARDIOPULMONARY: No increased WOB. Speaking in clear sentences.  PSYCH: Pleasant and cooperative. Speech normal rate and rhythm. Affect is appropriate. Insight and judgement are appropriate. Attention is focused, linear, and appropriate.  NEURO: Oriented as arrived to appointment on time with no prompting.   Attestation Statements:   Reviewed by clinician on day of visit: allergies, medications, problem list, medical history, surgical history, family history, social history, and previous encounter notes.   This was prepared with the assistance of Engineer, civil (consulting).  Occasional wrong-word or sound-a-like substitutions may have occurred due to the inherent limitations of voice recognition software.

## 2023-03-07 ENCOUNTER — Telehealth: Payer: Commercial Managed Care - PPO | Admitting: Physician Assistant

## 2023-03-07 DIAGNOSIS — A049 Bacterial intestinal infection, unspecified: Secondary | ICD-10-CM | POA: Diagnosis not present

## 2023-03-07 MED ORDER — ONDANSETRON 4 MG PO TBDP
4.0000 mg | ORAL_TABLET | Freq: Three times a day (TID) | ORAL | 0 refills | Status: DC | PRN
Start: 2023-03-07 — End: 2024-04-30
  Filled 2023-03-07: qty 20, 7d supply, fill #0

## 2023-03-07 MED ORDER — AZITHROMYCIN 500 MG PO TABS
500.0000 mg | ORAL_TABLET | Freq: Every day | ORAL | 0 refills | Status: AC
Start: 2023-03-07 — End: 2023-03-13
  Filled 2023-03-07: qty 6, 5d supply, fill #0

## 2023-03-07 NOTE — Progress Notes (Signed)
E-Visit for Diarrhea  We are sorry that you are not feeling well.  Here is how we plan to help!  Based on what you have shared with me it looks like you have Acute Infectious Diarrhea.  Most cases of acute diarrhea are due to infections with virus and bacteria and are self-limited conditions lasting less than 14 days.  For your symptoms you may take Imodium 2 mg tablets that are over the counter at your local pharmacy. Take two tablet now and then one after each loose stool up to 6 a day.  Antibiotics are not needed for most people with diarrhea.  I have prescribed  Zofran 4 mg 1 tablet every 8 hours as needed for nausea and vomiting and I have prescribed azithromycin 500 mg daily for 3 days  HOME CARE We recommend changing your diet to help with your symptoms for the next few days. Drink plenty of fluids that contain water salt and sugar. Sports drinks such as Gatorade may help.  You may try broths, soups, bananas, applesauce, soft breads, mashed potatoes or crackers.  You are considered infectious for as long as the diarrhea continues. Hand washing or use of alcohol based hand sanitizers is recommend. It is best to stay out of work or school until your symptoms stop.   GET HELP RIGHT AWAY If you have dark yellow colored urine or do not pass urine frequently you should drink more fluids.   If your symptoms worsen  If you feel like you are going to pass out (faint) You have a new problem  MAKE SURE YOU  Understand these instructions. Will watch your condition. Will get help right away if you are not doing well or get worse.  Thank you for choosing an e-visit.  Your e-visit answers were reviewed by a board certified advanced clinical practitioner to complete your personal care plan. Depending upon the condition, your plan could have included both over the counter or prescription medications.  Please review your pharmacy choice. Make sure the pharmacy is open so you can pick up  prescription now. If there is a problem, you may contact your provider through [employer] and have the prescription routed to another pharmacy.  Your safety is important to [place]. If you have drug allergies check your prescription carefully.   For the next 24 hours you can use MyChart to ask questions about today's visit, request a non-urgent call back, or ask for a work or school excuse. You will get an email in the next two days asking about your experience. I hope that your e-visit has been valuable and will speed your recovery.

## 2023-03-07 NOTE — Progress Notes (Signed)
I have spent 5 minutes in review of e-visit questionnaire, review and updating patient chart, medical decision making and response to patient.   William Cody Martin, PA-C    

## 2023-03-08 ENCOUNTER — Ambulatory Visit
Admission: EM | Admit: 2023-03-08 | Discharge: 2023-03-08 | Disposition: A | Discharge status: Home / Self Care | Payer: Commercial Managed Care - PPO | Attending: Emergency Medicine | Admitting: Emergency Medicine

## 2023-03-08 ENCOUNTER — Other Ambulatory Visit: Payer: Self-pay

## 2023-03-08 DIAGNOSIS — R1084 Generalized abdominal pain: Secondary | ICD-10-CM

## 2023-03-08 DIAGNOSIS — R197 Diarrhea, unspecified: Secondary | ICD-10-CM

## 2023-03-08 DIAGNOSIS — R110 Nausea: Secondary | ICD-10-CM | POA: Insufficient documentation

## 2023-03-08 DIAGNOSIS — R509 Fever, unspecified: Secondary | ICD-10-CM | POA: Insufficient documentation

## 2023-03-08 LAB — C DIFFICILE QUICK SCREEN W PCR REFLEX
C Diff antigen: NEGATIVE
C Diff interpretation: NOT DETECTED
C Diff toxin: NEGATIVE

## 2023-03-08 NOTE — ED Triage Notes (Signed)
Patient to Urgent Care with complaints of diarrhea/ fevers/ abdominal cramping. Reports watery stools up to 30 times a day. Dry heaving. Able to keep down fluids.   Max temp 102 Monday. 101.5 last night. 100.5 this morning.  Reports symptoms started for days ago. Taking Zofran/ phenergan/ Imodium/ tylenol. Denies any sick contacts.  Prescribed Zithromax/ Zofran via e-visit yesterday.

## 2023-03-08 NOTE — Discharge Instructions (Addendum)
Your stool tests are pending.    Go to the emergency department if you have persistent or worsening symptoms.  Follow-up with your primary care provider.

## 2023-03-08 NOTE — ED Provider Notes (Addendum)
Christine Adams    CSN: [id number] Arrival date & time: 03/08/23  1032      History   Chief Complaint Chief Complaint  Patient presents with   Fever   Diarrhea    HPI Christine Adams is a 42 y.o. female.  Patient presents with fever, nausea, diarrhea, abdominal pain x 2 days. Tmax 102.  No recent travel or antibiotic use.  No one at home with similar symptoms.  Patient had an e-visit yesterday and was prescribed Zithromax and Zofran for bacterial gastroenteritis.  She took the first dose of Zithromax last night.  She also has been treating her symptoms with Phenergan and Imodium.  No emesis, dysuria, or other symptoms.  LMP current.  The history is provided by the patient and medical records.    Past Medical History:  Diagnosis Date   ADHD    Asthma    Back pain    BMI 30.0-30.9,adult    Hypothyroidism    Age 59 yrs   Other fatigue    Postpartum anxiety- ongoing    Postpartum depression    Rosacea    Shortness of breath on exertion    Sleep walking disorder 01/22/2015   Vitamin D deficiency     Patient Active Problem List   Diagnosis Date Noted   Mood disorder (HCC) 09/29/2022   Class 1 obesity with serious comorbidity and body mass index (BMI) of 32.0 to 32.9 in adult 09/29/2022   Other hyperlipidemia-with increased TG 07/07/2022   Hypertriglyceridemia 06/02/2022   Elevated BP without diagnosis of hypertension 04/25/2022   Joint pain 01/27/2022   Vitamin D deficiency 01/15/2022   At risk for side effect of medication 01/15/2022   Generalized obesity 01/15/2022   Chronic back pain 12/07/2021   At risk for impaired metabolic function 12/07/2021   Stress and adjustment reaction 10/02/2021   Insulin resistance 10/02/2021   Chronic low back pain 06/01/2021   Fatigue 12/09/2020   BMI 30.0-30.9,adult    Rosacea 12/10/2018   Encounter for annual general medical examination with abnormal findings in adult 12/08/2016   Hypothyroidism 05/25/2016   Attention  deficit hyperactivity disorder (ADHD) 05/25/2016   Dysplasia of cervix, low grade (CIN 1) 11/25/2013   Pap smear abnormality of cervix/human papillomavirus (HPV) positive 11/25/2013   Sleep disorder, circadian, shift work type 06/07/2013   Chronic cough 02/27/2013   Anxiety 01/21/2013   Seasonal allergies 01/21/2013    Past Surgical History:  Procedure Laterality Date   WISDOM TOOTH EXTRACTION      OB History     Gravida  1   Para  1   Term  1   Preterm      AB      Living  1      SAB      IAB      Ectopic      Multiple  0   Live Births  1            Home Medications    Prior to Admission medications   Medication Sig Start Date End Date Taking? Authorizing Provider  amphetamine-dextroamphetamine (ADDERALL XR) 10 MG 24 hr capsule Take 1 capsule (10 mg total) by mouth every morning for ADHD. 08/28/22     azelaic acid (AZELEX) 20 % cream Apply 1 application topically 2 (two) times daily. After skin is thoroughly washed and patted dry, gently but thoroughly massage a thin film of azelaic acid cream into the affected area twice daily, in the morning  and evening.    [provider]  azithromycin (ZITHROMAX) 250 MG tablet Take 2 tablets (500 mg total) by mouth daily for 1 day, THEN 1 tablet (250 mg total) daily for 4 days. 03/07/23 03/13/23  Waldon Merl, PA-C  cholecalciferol (VITAMIN D) 1000 units tablet Take 3,000 Units by mouth daily.    [provider]  dextroamphetamine (DEXEDRINE SPANSULE) 10 MG 24 hr capsule Take 1 capsule (10 mg total) by mouth every morning. 12/23/22     dextroamphetamine (DEXEDRINE SPANSULE) 10 MG 24 hr capsule Take 1 capsule (10 mg total) by mouth daily after breakfast. 03/20/23     dextroamphetamine (DEXEDRINE SPANSULE) 10 MG 24 hr capsule Take 1 capsule (10 mg total) by mouth daily after breakfast. 02/18/23     dextroamphetamine (DEXEDRINE SPANSULE) 10 MG 24 hr capsule Take 1 capsule (10 mg total) by mouth daily after  breakfast. 01/19/23     fluconazole (DIFLUCAN) 150 MG tablet Take 1 tablet (150 mg total) by mouth every 3 (three) days. 12/22/22   Borders, Daryl Eastern, NP  fluorouracil (EFUDEX) 5 % cream Apply to right nasal ala bid for 7 days 08/11/22   Neale Burly, [place], MD  levothyroxine (SYNTHROID) 137 MCG tablet Take 1 tablet (137 mcg total) by mouth every morning on an empty stomach with water only.  No food or other medications for 30 minutes. 05/02/22   Doreene Nest, NP  ondansetron (ZOFRAN-ODT) 4 MG disintegrating tablet Take 1 tablet (4 mg total) by mouth every 8 (eight) hours as needed for nausea or vomiting. 03/07/23   Waldon Merl, PA-C  Prenatal Vit-Fe Fumarate-FA (PRENATAL MULTIVITAMIN) TABS tablet Take 1 tablet by mouth daily at 12 noon.    [provider]  sertraline (ZOLOFT) 50 MG tablet Take 1.5 tablets (75 mg total) by mouth daily. 01/19/23     temazepam (RESTORIL) 7.5 MG capsule Take 1 capsule (7.5 mg total) by mouth at bedtime as needed for sleep. 07/29/22     trimethoprim-polymyxin b (POLYTRIM) ophthalmic solution Place one drop in left eye every 4 hours for 7 days 03/03/22   Rushie Chestnut, PA-C  valACYclovir (VALTREX) 500 MG tablet Take by mouth as needed. 12/25/18   [provider]    Family History Family History  Problem Relation Age of Onset   Clotting disorder Mother    Obesity Father    Heart disease Father    Sudden Cardiac Death Father    Hypertension Father    Hyperlipidemia Father    ADD / ADHD Sister    Obesity Sister    Diabetes Maternal Grandmother    Mesothelioma Maternal Grandmother    Alcohol abuse Paternal Grandmother    Hypertension Paternal Grandfather     Social History Social History   Tobacco Use   Smoking status: Never   Smokeless tobacco: Never  Vaping Use   Vaping status: Never Used  Substance Use Topics   Alcohol use: Yes    Comment: occasional   Drug use: Never     Allergies   Molds & smuts   Review of  Systems Review of Systems  Constitutional:  Positive for fever. Negative for chills.  Gastrointestinal:  Positive for abdominal pain and diarrhea. Negative for vomiting.  Genitourinary:  Negative for dysuria and hematuria.     Physical Exam Triage Vital Signs ED Triage Vitals  Encounter Vitals Group     BP      Systolic BP Percentile      Diastolic BP  Percentile      Pulse      Resp      Temp      Temp src      SpO2      Weight      Height      Head Circumference      Peak Flow      Pain Score      Pain Loc      Pain Education      Exclude from Growth Chart    No data found.  Updated Vital Signs BP 137/87   Pulse 88   Temp 99.6 F (37.6 C)   Resp 18   LMP 03/08/2023   SpO2 97%   Visual Acuity Right Eye Distance:   Left Eye Distance:   Bilateral Distance:    Right Eye Near:   Left Eye Near:    Bilateral Near:     Physical Exam Vitals and nursing note reviewed.  Constitutional:      General: She is not in acute distress.    Appearance: She is well-developed.  HENT:     Mouth/Throat:     Mouth: Mucous membranes are moist.  Cardiovascular:     Rate and Rhythm: Normal rate and regular rhythm.     Heart sounds: Normal heart sounds.  Pulmonary:     Effort: Pulmonary effort is normal. No respiratory distress.     Breath sounds: Normal breath sounds.  Abdominal:     General: Bowel sounds are normal.     Palpations: Abdomen is soft.     Tenderness: There is generalized abdominal tenderness. There is no guarding or rebound.     Comments: Generalized tenderness to palpation but no guarding or rebound.  Musculoskeletal:     Cervical back: Neck supple.  Skin:    General: Skin is warm and dry.  Neurological:     Mental Status: She is alert.      UC Treatments / Results  Labs (all labs ordered are listed, but only abnormal results are displayed) Labs Reviewed  GASTROINTESTINAL PANEL BY PCR, STOOL (REPLACES STOOL CULTURE)  C DIFFICILE QUICK SCREEN W  PCR REFLEX    CBC  COMPREHENSIVE METABOLIC PANEL    EKG   Radiology No results found.  Procedures Procedures (including critical care time)  Medications Ordered in UC Medications - No data to display  Initial Impression / Assessment and Plan / UC Course  I have reviewed the triage vital signs and the nursing notes.  Pertinent labs & imaging results that were available during my care of the patient were reviewed by me and considered in my medical decision making (see chart for details).    Diarrhea, generalized abdominal pain.  Vital signs are stable.  Patient declines transfer to the ED.  Her abdomen is mildly tender to palpation.  Good bowel sounds.  No rebound or guarding.  She reports she is able to maintain her hydration orally at home.  Per patient's request, stool specimen to lab for c.diff and GI panel.  CBC and CMP pending.  Patient had an e-visit yesterday and was started on Zithromax and Zofran.  Instructed her to follow-up with her PCP.  ED precautions given.  She agrees to plan of care.     Final Clinical Impressions(s) / UC Diagnoses   Final diagnoses:  Diarrhea, unspecified type  Generalized abdominal pain     Discharge Instructions      Your stool tests are pending.  Go to the emergency department if you have persistent or worsening symptoms.  Follow-up with your primary care provider.     ED Prescriptions   None    PDMP not reviewed this encounter.   Mickie Bail, NP 03/08/23 1144    Mickie Bail, NP 03/08/23 1215    Mickie Bail, NP 03/08/23 1340

## 2023-03-09 ENCOUNTER — Other Ambulatory Visit: Payer: Self-pay

## 2023-03-09 ENCOUNTER — Telehealth: Payer: Self-pay | Admitting: Emergency Medicine

## 2023-03-09 LAB — COMPREHENSIVE METABOLIC PANEL
ALT: 19 IU/L (ref 0–32)
AST: 22 IU/L (ref 0–40)
Albumin: 4.4 g/dL (ref 3.9–4.9)
Alkaline Phosphatase: 114 IU/L (ref 44–121)
BUN/Creatinine Ratio: 11 (ref 9–23)
BUN: 9 mg/dL (ref 6–24)
Bilirubin Total: 0.4 mg/dL (ref 0.0–1.2)
CO2: 21 mmol/L (ref 20–29)
Calcium: 9.1 mg/dL (ref 8.7–10.2)
Chloride: 102 mmol/L (ref 96–106)
Creatinine, Ser: 0.85 mg/dL (ref 0.57–1.00)
Globulin, Total: 2.6 g/dL (ref 1.5–4.5)
Glucose: 93 mg/dL (ref 70–99)
Potassium: 3.9 mmol/L (ref 3.5–5.2)
Sodium: 138 mmol/L (ref 134–144)
Total Protein: 7 g/dL (ref 6.0–8.5)
eGFR: 88 mL/min/{1.73_m2} (ref >59)

## 2023-03-09 LAB — GASTROINTESTINAL PANEL BY PCR, STOOL (REPLACES STOOL CULTURE)

## 2023-03-09 LAB — CBC
Hematocrit: 51 % — ABNORMAL HIGH (ref 34.0–46.6)
Hemoglobin: 16.5 g/dL — ABNORMAL HIGH (ref 11.1–15.9)
MCH: 29.1 pg (ref 26.6–33.0)
MCHC: 32.4 g/dL (ref 31.5–35.7)
MCV: 90 fL (ref 79–97)
Platelets: 213 10*3/uL (ref 150–450)
RBC: 5.67 x10E6/uL — ABNORMAL HIGH (ref 3.77–5.28)
RDW: 12.1 % (ref 11.7–15.4)
WBC: 6.9 10*3/uL (ref 3.4–10.8)

## 2023-03-09 NOTE — Telephone Encounter (Signed)
Telephone call to patient to discuss positive stool result for Salmonella.  She reports ongoing diarrhea and abdominal cramping.  She states her fever has improved.  She is currently on Zithromax.  She continues to decline going to the ED.  Instructed her to follow-up with her PCP today.  ED precautions discussed.  She agrees to plan of care.

## 2023-03-13 ENCOUNTER — Ambulatory Visit: Payer: Commercial Managed Care - PPO | Admitting: Dermatology

## 2023-03-16 NOTE — Telephone Encounter (Signed)
error 

## 2023-03-28 ENCOUNTER — Ambulatory Visit: Payer: Commercial Managed Care - PPO | Admitting: Dermatology

## 2023-03-28 ENCOUNTER — Encounter: Payer: Self-pay | Admitting: Dermatology

## 2023-03-28 VITALS — BP 131/90 | HR 70

## 2023-03-28 DIAGNOSIS — Z872 Personal history of diseases of the skin and subcutaneous tissue: Secondary | ICD-10-CM | POA: Diagnosis not present

## 2023-03-28 DIAGNOSIS — I781 Nevus, non-neoplastic: Secondary | ICD-10-CM | POA: Diagnosis not present

## 2023-03-28 NOTE — Patient Instructions (Signed)
Recommend daily broad spectrum sunscreen SPF 30+ to sun-exposed areas, reapply every 2 hours as needed. Call for new or changing lesions.  Staying in the shade or wearing long sleeves, sun glasses (UVA+UVB protection) and wide brim hats (4-inch brim around the entire circumference of the hat) are also recommended for sun protection.    Due to recent changes in healthcare laws, you may see results of your pathology and/or laboratory studies on MyChart before the doctors have had a chance to review them. We understand that in some cases there may be results that are confusing or concerning to you. Please understand that not all results are received at the same time and often the doctors may need to interpret multiple results in order to provide you with the best plan of care or course of treatment. Therefore, we ask that you please give [place] 2 business days to thoroughly review all your results before contacting the office for clarification. Should we see a critical lab result, you will be contacted sooner.   If You Need Anything After Your Visit  If you have any questions or concerns for your doctor, please call our main line at [phone] and press option 4 to reach your doctor's medical assistant. If no one answers, please leave a voicemail as directed and we will return your call as soon as possible. Messages left after 4 pm will be answered the following business day.   You may also send [place] a message via MyChart. We typically respond to MyChart messages within 1-2 business days.  For prescription refills, please ask your pharmacy to contact our office. Our fax number is [phone].  If you have an urgent issue when the clinic is closed that cannot wait until the next business day, you can page your doctor at the number below.    Please note that while we do our best to be available for urgent issues outside of office hours, we are not available 24/7.   If you have an urgent issue and are  unable to reach [place], you may choose to seek medical care at your doctor's office, retail clinic, urgent care center, or emergency room.  If you have a medical emergency, please immediately call 911 or go to the emergency department.  Pager Numbers  - Dr. Gwen Pounds: [phone]  - Dr. Roseanne Reno: [phone]  - Dr. Katrinka Blazing: [phone]   In the event of inclement weather, please call our main line at [phone] for an update on the status of any delays or closures.  Dermatology Medication Tips: Please keep the boxes that topical medications come in in order to help keep track of the instructions about where and how to use these. Pharmacies typically print the medication instructions only on the boxes and not directly on the medication tubes.   If your medication is too expensive, please contact our office at [phone] option 4 or send [place] a message through MyChart.   We are unable to tell what your co-pay for medications will be in advance as this is different depending on your insurance coverage. However, we may be able to find a substitute medication at lower cost or fill out paperwork to get insurance to cover a needed medication.   If a prior authorization is required to get your medication covered by your insurance company, please allow [place] 1-2 business days to complete this process.  Drug prices often vary depending on where the prescription is filled and some pharmacies may offer cheaper prices.  The website  www.goodrx.com contains coupons for medications through different pharmacies. The prices here do not account for what the cost may be with help from insurance (it may be cheaper with your insurance), but the website can give you the price if you did not use any insurance.  - You can print the associated coupon and take it with your prescription to the pharmacy.  - You may also stop by our office during regular business hours and pick up a GoodRx coupon card.  - If you need your  prescription sent electronically to a different pharmacy, notify our office through [facility] or by phone at [phone] option 4.     Si Usted Necesita Algo Despus de Su Visita  Tambin puede enviarnos un mensaje a travs de Clinical cytogeneticist. Por lo general respondemos a los mensajes de MyChart en el transcurso de 1 a 2 das hbiles.  Para renovar recetas, por favor pida a su farmacia que se ponga en contacto con nuestra oficina. Annie Sable de fax es [place] [phone].  Si tiene un asunto urgente cuando la clnica est cerrada y que no puede esperar hasta el siguiente da hbil, puede llamar/localizar a su doctor(a) al nmero que aparece a continuacin.   Por favor, tenga en cuenta que aunque hacemos todo lo posible para estar disponibles para asuntos urgentes fuera del horario de [place], no estamos disponibles las 24 horas del da, los 7 [address]  [address] de la [place].   Si tiene un problema urgente y no puede comunicarse con nosotros, puede optar por buscar atencin mdica  en el consultorio de su doctor(a), en una clnica privada, en un centro de atencin urgente o en una sala de emergencias.  Si tiene Engineer, drilling, por favor llame inmediatamente al 911 o vaya a la sala de emergencias.  Nmeros de bper  - Dr. Gwen Pounds: [phone]  - Dra. Roseanne Reno: [phone]  - Dr. Katrinka Blazing: [phone]   En caso de inclemencias del tiempo, por favor llame a Lacy Duverney principal al [phone] para una actualizacin sobre el [place] de cualquier retraso o cierre.  Consejos para la medicacin en dermatologa: Por favor, guarde las cajas en las que vienen los medicamentos de uso tpico para ayudarle a seguir las instrucciones sobre dnde y cmo usarlos. Las farmacias generalmente imprimen las instrucciones del medicamento slo en las cajas y no directamente en los tubos del [place].   Si su medicamento es muy caro, por favor, pngase en contacto con Rolm [place] llamando al  [phone] y presione la opcin 4 o envenos un mensaje a travs de Clinical cytogeneticist.   No podemos decirle cul ser su copago por los medicamentos por adelantado ya que esto es diferente dependiendo de la cobertura de su seguro. Sin embargo, es posible que podamos encontrar un medicamento sustituto a Audiological scientist un formulario para que el seguro cubra el medicamento que se considera necesario.   Si se requiere una autorizacin previa para que su compaa de seguros [place] su medicamento, por favor permtanos de 1 a 2 das hbiles para completar [address]  Los precios de los medicamentos varan con frecuencia dependiendo del Environmental consultant de dnde se surte la receta y alguna farmacias pueden ofrecer precios ms baratos.  El sitio web www.goodrx.com tiene cupones para medicamentos de Health and safety inspector. Los precios aqu no tienen en cuenta lo que podra costar con la ayuda del seguro (puede ser ms barato con su seguro), pero el sitio web puede darle el precio si no utiliz Tourist information centre manager.  - Puede  imprimir el cupn correspondiente y llevarlo con su receta a la farmacia.  - Tambin puede pasar por nuestra oficina durante el horario de atencin regular y Education officer, museum una tarjeta de cupones de GoodRx.  - Si necesita que su receta se enve electrnicamente a una farmacia diferente, informe a nuestra oficina a travs de MyChart de [place] o por telfono llamando al [phone] y presione la opcin 4.

## 2023-03-28 NOTE — Progress Notes (Signed)
   Follow-Up Visit   Subjective  Christine Adams is a 42 y.o. female who presents for the following: 4 month AK follow up. Right nasal ala. S/P two rounds of 5FU/Calcipotriene treatment. Thinks area looks flatter after last treatment. Crusted up and sloughed off, now is just red and flat.  The patient has spots, moles and lesions to be evaluated, some may be new or changing and the patient may have concern these could be cancer.   The following portions of the chart were reviewed this encounter and updated as appropriate: medications, allergies, medical history  Review of Systems:  No other skin or systemic complaints except as noted in HPI or Assessment and Plan.  Objective  Well appearing patient in no apparent distress; mood and affect are within normal limits.  A focused examination was performed of the following areas: Face, nose  Relevant exam findings are noted in the Assessment and Plan.  Right Ala Nasi Milium adjacent to red macule with telangiectasias          Assessment & Plan     History of actinic keratosis Right Ala Nasi  No clinical evidence of skin cancer or precancer. Residual erythema from telangiectasias may be sequela of treatment or prior lesion. Close monitoring with skin check in February  Patient will return if worsening  Telangiectasia    Return in about 4 months (around 07/29/2023) for TBSE, recheck nose , With Dr. Katrinka Blazing.  I, Lawson Radar, CMA, am acting as scribe for Elie Goody, MD.   Documentation: I have reviewed the above documentation for accuracy and completeness, and I agree with the above.  Elie Goody, MD

## 2023-03-30 ENCOUNTER — Ambulatory Visit (INDEPENDENT_AMBULATORY_CARE_PROVIDER_SITE_OTHER): Payer: Commercial Managed Care - PPO | Admitting: Family Medicine

## 2023-04-03 ENCOUNTER — Other Ambulatory Visit: Payer: Self-pay | Admitting: Primary Care

## 2023-04-03 DIAGNOSIS — N63 Unspecified lump in unspecified breast: Secondary | ICD-10-CM

## 2023-04-05 ENCOUNTER — Other Ambulatory Visit: Payer: Self-pay

## 2023-04-05 ENCOUNTER — Other Ambulatory Visit: Payer: Self-pay | Admitting: Primary Care

## 2023-04-05 DIAGNOSIS — E039 Hypothyroidism, unspecified: Secondary | ICD-10-CM

## 2023-04-05 MED ORDER — LEVOTHYROXINE SODIUM 137 MCG PO TABS
137.0000 ug | ORAL_TABLET | Freq: Every morning | ORAL | 0 refills | Status: DC
Start: 2023-04-05 — End: 2023-06-15
  Filled 2023-04-05: qty 90, 90d supply, fill #0

## 2023-04-13 ENCOUNTER — Ambulatory Visit: Payer: Commercial Managed Care - PPO | Admitting: Primary Care

## 2023-04-13 VITALS — BP 102/70 | HR 88 | Temp 98.4°F | Ht 65.0 in | Wt 169.0 lb

## 2023-04-13 DIAGNOSIS — G8929 Other chronic pain: Secondary | ICD-10-CM | POA: Diagnosis not present

## 2023-04-13 DIAGNOSIS — F419 Anxiety disorder, unspecified: Secondary | ICD-10-CM

## 2023-04-13 DIAGNOSIS — M545 Low back pain, unspecified: Secondary | ICD-10-CM

## 2023-04-13 DIAGNOSIS — E781 Pure hyperglyceridemia: Secondary | ICD-10-CM

## 2023-04-13 DIAGNOSIS — Z0000 Encounter for general adult medical examination without abnormal findings: Secondary | ICD-10-CM | POA: Diagnosis not present

## 2023-04-13 DIAGNOSIS — E039 Hypothyroidism, unspecified: Secondary | ICD-10-CM

## 2023-04-13 LAB — TSH: TSH: 1.85 u[IU]/mL (ref 0.35–5.50)

## 2023-04-13 NOTE — Assessment & Plan Note (Signed)
No concerns today. Continue to monitor. 

## 2023-04-13 NOTE — Assessment & Plan Note (Signed)
Immunizations UTD. Pap smear UTD. Follows with GYN Mammogram UTD and scheduled.  Discussed the importance of a healthy diet and regular exercise in order for weight loss, and to reduce the risk of further co-morbidity.  Exam stable. Labs pending and reviewed.  Follow up in 1 year for repeat physical.

## 2023-04-13 NOTE — Patient Instructions (Signed)
Stop by the lab prior to leaving today. I will notify you of your results once received.   It was a pleasure to see you today!  

## 2023-04-13 NOTE — Assessment & Plan Note (Signed)
Reviewed labs from December 2023.  Commended her on weight loss!

## 2023-04-13 NOTE — Progress Notes (Signed)
Subjective:    Patient ID: Christine Adams, female    DOB: October 17, 1980, 42 y.o.   MRN: [id number]  HPI  Christine Adams is a very pleasant 42 y.o. female who presents today for complete physical and follow up of chronic conditions.  Immunizations: -Tetanus: Completed in 2021 -Influenza: Completed this season   Diet: Fair diet.  Exercise: No regular exercise.  Eye exam: Completes annually  Dental exam: Completes semi-annually    Pap Smear: UTD, follows with GYN Mammogram: Completed in October 2023, scheduled for October 2024   Wt Readings from Last 3 Encounters:  04/13/23 169 lb (76.7 kg)  01/05/23 169 lb (76.7 kg)  12/06/22 166 lb (75.3 kg)        Review of Systems  Constitutional:  Negative for unexpected weight change.  HENT:  Negative for rhinorrhea.   Respiratory:  Negative for cough and shortness of breath.   Cardiovascular:  Negative for chest pain.  Gastrointestinal:  Negative for constipation and diarrhea.  Genitourinary:  Negative for difficulty urinating.  Musculoskeletal:  Negative for arthralgias and myalgias.  Skin:  Negative for rash.  Allergic/Immunologic: Negative for environmental allergies.  Neurological:  Negative for dizziness and headaches.  Psychiatric/Behavioral:  The patient is nervous/anxious.          Past Medical History:  Diagnosis Date   ADHD    Asthma    Back pain    BMI 30.0-30.9,adult    Chronic back pain 12/07/2021   Chronic cough 02/27/2013   Elevated BP without diagnosis of hypertension 04/25/2022   Hypothyroidism    Age 27 yrs   Other fatigue    Postpartum anxiety- ongoing    Postpartum depression    Rosacea    Shortness of breath on exertion    Sleep walking disorder 01/22/2015   Vitamin D deficiency     Social History   Socioeconomic History   Marital status: Married    Spouse name: Ryan   Number of children: 1   Years of education: Not on file   Highest education level: Doctorate  Occupational History    Occupation: Dentist: [employer]  Tobacco Use   Smoking status: Never   Smokeless tobacco: Never  Vaping Use   Vaping status: Never Used  Substance and Sexual Activity   Alcohol use: Yes    Comment: occasional   Drug use: Never   Sexual activity: Yes    Partners: Male  Other Topics Concern   Not on file  Social History Narrative   Married since 2018.Lives with husband and son.   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   Social Determinants of Health   Financial Resource Strain: Low Risk  (04/13/2023)   Overall Financial Resource Strain (CARDIA)    Difficulty of Paying Living Expenses: Not hard at all  Food Insecurity: No Food Insecurity (04/13/2023)   Hunger Vital Sign    Worried About Running Out of Food in the Last Year: Never true    Ran Out of Food in the Last Year: Never true  Transportation Needs: No Transportation Needs (04/13/2023)   PRAPARE - Administrator, Civil Service (Medical): No    Lack of Transportation (Non-Medical): No  Physical Activity: Insufficiently Active (04/13/2023)   Exercise Vital Sign    Days of Exercise per Week: 4 days    Minutes of Exercise per Session: 30 min  Stress: Stress Concern Present (04/13/2023)   [employer] of Occupational Health -  Occupational Stress Questionnaire    Feeling of Stress : Rather much  Social Connections: Moderately Integrated (04/13/2023)   Social Connection and Isolation Panel [NHANES]    Frequency of Communication with Friends and Family: More than three times a week    Frequency of Social Gatherings with Friends and Family: Twice a week    Attends Religious Services: Never    Database administrator or Organizations: Yes    Attends Engineer, structural: More than 4 times per year    Marital Status: Married  Catering manager Violence: Not on file    Past Surgical History:  Procedure Laterality Date   WISDOM TOOTH EXTRACTION       Family History  Problem Relation Age of Onset   Clotting disorder Mother    Obesity Father    Heart disease Father    Sudden Cardiac Death Father    Hypertension Father    Hyperlipidemia Father    ADD / ADHD Sister    Obesity Sister    Diabetes Maternal Grandmother    Mesothelioma Maternal Grandmother    Alcohol abuse Paternal Grandmother    Hypertension Paternal Grandfather     Allergies  Allergen Reactions   Molds & Smuts Itching    Dust mites, mold airborne     Current Outpatient Medications on File Prior to Visit  Medication Sig Dispense Refill   amphetamine-dextroamphetamine (ADDERALL XR) 10 MG 24 hr capsule Take 1 capsule (10 mg total) by mouth every morning for ADHD. 30 capsule 0   azelaic acid (AZELEX) 20 % cream Apply 1 application topically 2 (two) times daily. After skin is thoroughly washed and patted dry, gently but thoroughly massage a thin film of azelaic acid cream into the affected area twice daily, in the morning and evening.     cholecalciferol (VITAMIN D) 1000 units tablet Take 3,000 Units by mouth daily.     dextroamphetamine (DEXEDRINE SPANSULE) 10 MG 24 hr capsule Take 1 capsule (10 mg total) by mouth daily after breakfast. 30 capsule 0   dextroamphetamine (DEXEDRINE SPANSULE) 10 MG 24 hr capsule Take 1 capsule (10 mg total) by mouth daily after breakfast. 30 capsule 0   levothyroxine (SYNTHROID) 137 MCG tablet Take 1 tablet (137 mcg total) by mouth every morning on an empty stomach with water only.  No food or other medications for 30 minutes. 90 tablet 0   ondansetron (ZOFRAN-ODT) 4 MG disintegrating tablet Take 1 tablet (4 mg total) by mouth every 8 (eight) hours as needed for nausea or vomiting. 20 tablet 0   Prenatal Vit-Fe Fumarate-FA (PRENATAL MULTIVITAMIN) TABS tablet Take 1 tablet by mouth daily at 12 noon.     sertraline (ZOLOFT) 50 MG tablet Take 1.5 tablets (75 mg total) by mouth daily. 45 tablet 2   temazepam (RESTORIL) 7.5 MG capsule Take 1  capsule (7.5 mg total) by mouth at bedtime as needed for sleep. 30 capsule 2   valACYclovir (VALTREX) 500 MG tablet Take by mouth as needed.     No current facility-administered medications on file prior to visit.    BP 102/70 (BP Location: Left Arm, Patient Position: Sitting, Cuff Size: Normal)   Pulse 88   Temp 98.4 F (36.9 C) (Oral)   Ht 5\' 5"  (1.651 m)   Wt 169 lb (76.7 kg)   SpO2 98%   BMI 28.12 kg/m  Objective:   Physical Exam HENT:     Right Ear: Tympanic membrane and ear canal normal.  Left Ear: Tympanic membrane and ear canal normal.  Eyes:     Pupils: Pupils are equal, round, and reactive to light.  Cardiovascular:     Rate and Rhythm: Normal rate and regular rhythm.  Pulmonary:     Effort: Pulmonary effort is normal.     Breath sounds: Normal breath sounds.  Abdominal:     General: Bowel sounds are normal.     Palpations: Abdomen is soft.     Tenderness: There is no abdominal tenderness.  Musculoskeletal:        General: Normal range of motion.     Cervical back: Neck supple.  Skin:    General: Skin is warm and dry.  Neurological:     Mental Status: She is alert and oriented to person, place, and time.     Cranial Nerves: No cranial nerve deficit.     Deep Tendon Reflexes:     Reflex Scores:      Patellar reflexes are 2+ on the right side and 2+ on the left side. Psychiatric:        Mood and Affect: Mood normal.           Assessment & Plan:  Acquired hypothyroidism Assessment & Plan: She is taking levothyroxine correctly.  Continue levothyroxine 137 mcg daily. Repeat TSH pending.  Orders: -     TSH  Anxiety Assessment & Plan: Deteriorated.  Following with psychiatry.  Continue sertraline 75 mg daily.   She will discuss increasing sertraline dose to 100 mg at her upcomming appointment.    Chronic midline low back pain without sciatica Assessment & Plan: No concerns today.  Continue to monitor.    Preventative health  care Assessment & Plan: Immunizations UTD. Pap smear UTD. Follows with GYN Mammogram UTD and scheduled.  Discussed the importance of a healthy diet and regular exercise in order for weight loss, and to reduce the risk of further co-morbidity.  Exam stable. Labs pending and reviewed.  Follow up in 1 year for repeat physical.    Hypertriglyceridemia Assessment & Plan: Reviewed labs from December 2023.  Commended her on weight loss!         Doreene Nest, NP

## 2023-04-13 NOTE — Assessment & Plan Note (Signed)
She is taking levothyroxine correctly.  Continue levothyroxine 137 mcg daily. Repeat TSH pending

## 2023-04-13 NOTE — Assessment & Plan Note (Signed)
Deteriorated.  Following with psychiatry.  Continue sertraline 75 mg daily.   She will discuss increasing sertraline dose to 100 mg at her upcomming appointment.

## 2023-04-18 ENCOUNTER — Ambulatory Visit
Admission: RE | Admit: 2023-04-18 | Discharge: 2023-04-18 | Disposition: A | Discharge status: Home / Self Care | Payer: Commercial Managed Care - PPO | Source: Ambulatory Visit | Attending: Primary Care

## 2023-04-18 ENCOUNTER — Ambulatory Visit
Admission: RE | Admit: 2023-04-18 | Discharge: 2023-04-18 | Disposition: A | Discharge status: Home / Self Care | Payer: Commercial Managed Care - PPO | Source: Ambulatory Visit | Attending: Primary Care | Admitting: Primary Care

## 2023-04-18 ENCOUNTER — Other Ambulatory Visit: Payer: Self-pay

## 2023-04-18 DIAGNOSIS — F902 Attention-deficit hyperactivity disorder, combined type: Secondary | ICD-10-CM | POA: Diagnosis not present

## 2023-04-18 DIAGNOSIS — N63 Unspecified lump in unspecified breast: Secondary | ICD-10-CM

## 2023-04-18 DIAGNOSIS — N6322 Unspecified lump in the left breast, upper inner quadrant: Secondary | ICD-10-CM | POA: Diagnosis not present

## 2023-04-18 DIAGNOSIS — R92333 Mammographic heterogeneous density, bilateral breasts: Secondary | ICD-10-CM | POA: Diagnosis not present

## 2023-04-18 DIAGNOSIS — F4322 Adjustment disorder with anxiety: Secondary | ICD-10-CM | POA: Diagnosis not present

## 2023-04-18 DIAGNOSIS — R928 Other abnormal and inconclusive findings on diagnostic imaging of breast: Secondary | ICD-10-CM | POA: Diagnosis not present

## 2023-04-18 MED ORDER — DEXTROAMPHETAMINE SULFATE ER 10 MG PO CP24
10.0000 mg | ORAL_CAPSULE | Freq: Every day | ORAL | 0 refills | Status: DC
Start: 2023-04-18 — End: 2024-04-30
  Filled 2023-05-16 – 2023-06-15 (×2): qty 30, 30d supply, fill #0

## 2023-04-18 MED ORDER — TEMAZEPAM 7.5 MG PO CAPS
7.5000 mg | ORAL_CAPSULE | Freq: Every evening | ORAL | 2 refills | Status: DC | PRN
Start: 2023-04-18 — End: 2024-04-30
  Filled 2023-04-18 – 2023-06-15 (×2): qty 30, 30d supply, fill #0

## 2023-04-18 MED ORDER — SERTRALINE HCL 50 MG PO TABS
75.0000 mg | ORAL_TABLET | Freq: Every day | ORAL | 2 refills | Status: DC
  Filled 2023-05-16: qty 45, 30d supply, fill #0
  Filled 2023-06-15: qty 45, 30d supply, fill #1

## 2023-04-18 MED ORDER — DEXTROAMPHETAMINE SULFATE ER 10 MG PO CP24: 10.0000 mg | ORAL_CAPSULE | Freq: Every day | ORAL | 0 refills | Status: DC

## 2023-04-19 ENCOUNTER — Other Ambulatory Visit: Payer: Self-pay

## 2023-05-03 ENCOUNTER — Other Ambulatory Visit: Payer: Self-pay

## 2023-05-11 DIAGNOSIS — Z124 Encounter for screening for malignant neoplasm of cervix: Secondary | ICD-10-CM | POA: Diagnosis not present

## 2023-05-11 DIAGNOSIS — Z9889 Other specified postprocedural states: Secondary | ICD-10-CM | POA: Diagnosis not present

## 2023-05-11 DIAGNOSIS — Z01419 Encounter for gynecological examination (general) (routine) without abnormal findings: Secondary | ICD-10-CM | POA: Diagnosis not present

## 2023-05-11 DIAGNOSIS — Z1151 Encounter for screening for human papillomavirus (HPV): Secondary | ICD-10-CM | POA: Diagnosis not present

## 2023-05-11 DIAGNOSIS — Z1339 Encounter for screening examination for other mental health and behavioral disorders: Secondary | ICD-10-CM | POA: Diagnosis not present

## 2023-05-11 DIAGNOSIS — Z1331 Encounter for screening for depression: Secondary | ICD-10-CM | POA: Diagnosis not present

## 2023-05-16 ENCOUNTER — Other Ambulatory Visit: Payer: Self-pay

## 2023-05-17 ENCOUNTER — Other Ambulatory Visit: Payer: Self-pay

## 2023-05-29 ENCOUNTER — Encounter (INDEPENDENT_AMBULATORY_CARE_PROVIDER_SITE_OTHER): Payer: Self-pay | Admitting: Family Medicine

## 2023-05-30 ENCOUNTER — Other Ambulatory Visit: Payer: Self-pay

## 2023-05-30 ENCOUNTER — Ambulatory Visit (INDEPENDENT_AMBULATORY_CARE_PROVIDER_SITE_OTHER): Payer: Commercial Managed Care - PPO | Admitting: Family Medicine

## 2023-06-15 ENCOUNTER — Other Ambulatory Visit: Payer: Self-pay

## 2023-06-15 ENCOUNTER — Other Ambulatory Visit: Payer: Self-pay | Admitting: Primary Care

## 2023-06-15 DIAGNOSIS — E039 Hypothyroidism, unspecified: Secondary | ICD-10-CM

## 2023-06-16 ENCOUNTER — Other Ambulatory Visit: Payer: Self-pay

## 2023-06-16 MED ORDER — LEVOTHYROXINE SODIUM 137 MCG PO TABS
137.0000 ug | ORAL_TABLET | Freq: Every morning | ORAL | 2 refills | Status: AC
  Filled 2023-06-16: qty 90, 90d supply, fill #0
  Filled 2023-09-14 – 2023-09-27 (×2): qty 90, 90d supply, fill #1
  Filled 2024-01-22: qty 90, 90d supply, fill #2

## 2023-07-06 ENCOUNTER — Ambulatory Visit (INDEPENDENT_AMBULATORY_CARE_PROVIDER_SITE_OTHER): Payer: Commercial Managed Care - PPO | Admitting: Family Medicine

## 2023-07-11 ENCOUNTER — Other Ambulatory Visit: Payer: Self-pay

## 2023-07-11 DIAGNOSIS — F902 Attention-deficit hyperactivity disorder, combined type: Secondary | ICD-10-CM | POA: Diagnosis not present

## 2023-07-11 DIAGNOSIS — F4322 Adjustment disorder with anxiety: Secondary | ICD-10-CM | POA: Diagnosis not present

## 2023-07-11 MED ORDER — DEXTROAMPHETAMINE SULFATE ER 15 MG PO CP24: 15.0000 mg | ORAL_CAPSULE | Freq: Every morning | ORAL | 0 refills | Status: DC

## 2023-07-11 MED ORDER — SERTRALINE HCL 100 MG PO TABS
100.0000 mg | ORAL_TABLET | Freq: Every day | ORAL | 1 refills | Status: DC
  Filled 2023-07-11: qty 30, 30d supply, fill #0
  Filled 2023-11-14: qty 30, 30d supply, fill #1

## 2023-07-11 MED ORDER — DEXTROAMPHETAMINE SULFATE ER 15 MG PO CP24
15.0000 mg | ORAL_CAPSULE | Freq: Every morning | ORAL | 0 refills | Status: DC
  Filled 2023-07-11: qty 30, 30d supply, fill #0

## 2023-08-01 ENCOUNTER — Other Ambulatory Visit: Payer: Self-pay

## 2023-08-01 ENCOUNTER — Ambulatory Visit: Payer: Commercial Managed Care - PPO | Admitting: Dermatology

## 2023-08-01 DIAGNOSIS — D2239 Melanocytic nevi of other parts of face: Secondary | ICD-10-CM | POA: Diagnosis not present

## 2023-08-01 DIAGNOSIS — L578 Other skin changes due to chronic exposure to nonionizing radiation: Secondary | ICD-10-CM

## 2023-08-01 DIAGNOSIS — Z872 Personal history of diseases of the skin and subcutaneous tissue: Secondary | ICD-10-CM

## 2023-08-01 DIAGNOSIS — L814 Other melanin hyperpigmentation: Secondary | ICD-10-CM

## 2023-08-01 DIAGNOSIS — D1801 Hemangioma of skin and subcutaneous tissue: Secondary | ICD-10-CM

## 2023-08-01 DIAGNOSIS — L821 Other seborrheic keratosis: Secondary | ICD-10-CM | POA: Diagnosis not present

## 2023-08-01 DIAGNOSIS — L309 Dermatitis, unspecified: Secondary | ICD-10-CM

## 2023-08-01 DIAGNOSIS — W908XXA Exposure to other nonionizing radiation, initial encounter: Secondary | ICD-10-CM | POA: Diagnosis not present

## 2023-08-01 DIAGNOSIS — L719 Rosacea, unspecified: Secondary | ICD-10-CM | POA: Diagnosis not present

## 2023-08-01 DIAGNOSIS — Z7189 Other specified counseling: Secondary | ICD-10-CM

## 2023-08-01 DIAGNOSIS — D225 Melanocytic nevi of trunk: Secondary | ICD-10-CM

## 2023-08-01 DIAGNOSIS — D229 Melanocytic nevi, unspecified: Secondary | ICD-10-CM

## 2023-08-01 DIAGNOSIS — D239 Other benign neoplasm of skin, unspecified: Secondary | ICD-10-CM

## 2023-08-01 DIAGNOSIS — Z1283 Encounter for screening for malignant neoplasm of skin: Secondary | ICD-10-CM

## 2023-08-01 DIAGNOSIS — L570 Actinic keratosis: Secondary | ICD-10-CM | POA: Diagnosis not present

## 2023-08-01 DIAGNOSIS — L249 Irritant contact dermatitis, unspecified cause: Secondary | ICD-10-CM

## 2023-08-01 DIAGNOSIS — L918 Other hypertrophic disorders of the skin: Secondary | ICD-10-CM

## 2023-08-01 DIAGNOSIS — D2362 Other benign neoplasm of skin of left upper limb, including shoulder: Secondary | ICD-10-CM

## 2023-08-01 MED ORDER — CLOBETASOL PROPIONATE 0.05 % EX CREA
TOPICAL_CREAM | CUTANEOUS | 1 refills | Status: AC
  Filled 2023-08-01: qty 15, 30d supply, fill #0

## 2023-08-01 MED ORDER — DOXYCYCLINE HYCLATE 20 MG PO TABS
20.0000 mg | ORAL_TABLET | Freq: Two times a day (BID) | ORAL | 11 refills | Status: DC
  Filled 2023-08-01: qty 10, 5d supply, fill #0
  Filled 2023-08-01: qty 50, 25d supply, fill #0
  Filled 2023-11-14: qty 60, 30d supply, fill #1

## 2023-08-01 NOTE — Patient Instructions (Addendum)
 Instructions for [facility] Medications  One or more of your medications was sent to the [facility] mail order compounding pharmacy. You will receive an email from them and can purchase the medicine through that link. It will then be mailed to your home at the address you confirmed. If for any reason you do not receive an email from them, please check your spam folder. If you still do not find the email, please let us  know. [facility] phone number is [phone].   Counseling for BBL / IPL / Laser and Coordination of Care Discussed the treatment option of Broad Band Light (BBL) /Intense Pulsed Light (IPL)/ Laser for skin discoloration, including brown spots and redness.  Typically we recommend at least 1-3 treatment sessions about 5-8 weeks apart for best results.  Cannot have tanned skin when BBL performed, and regular use of sunscreen/photoprotection is advised after the procedure to help maintain results. The patient's condition may also require maintenance treatments in the future.  The fee for BBL / laser treatments is $350 per treatment session for the whole face.  A fee can be quoted for other parts of the body.  Insurance typically does not pay for BBL/laser treatments and therefore the fee is an out-of-pocket cost. Recommend prophylactic valtrex treatment. Once scheduled for procedure, will send Rx in prior to patient's appointment.    - Start 5-fluorouracil /calcipotriene cream twice a day for 7 days to affected areas including right nasal ala.  Patient provided with handout reviewing treatment course and side effects and advised to call or message us  on MyChart with any concerns.  Reviewed course of treatment and expected reaction.  Patient advised to expect inflammation and crusting and advised that erosions are possible.  Patient advised to be diligent with sun protection during and after treatment. Counseled to keep medication out of reach of children and pets.    5-Fluorouracil /Calcipotriene Patient Education   Actinic keratoses are the dry, red scaly spots on the skin caused by sun damage. A portion of these spots can turn into skin cancer with time, and treating them can help prevent development of skin cancer.   Treatment of these spots requires removal of the defective skin cells. There are various ways to remove actinic keratoses, including freezing with liquid nitrogen, treatment with creams, or treatment with a blue light procedure in the office.   5-fluorouracil  cream is a topical cream used to treat actinic keratoses. It works by interfering with the growth of abnormal fast-growing skin cells, such as actinic keratoses. These cells peel off and are replaced by healthy ones.   5-fluorouracil /calcipotriene is a combination of the 5-fluorouracil  cream with a vitamin D  analog cream called calcipotriene. The calcipotriene alone does not treat actinic keratoses. However, when it is combined with 5-fluorouracil , it helps the 5-fluorouracil  treat the actinic keratoses much faster so that the same results can be achieved with a much shorter treatment time.  INSTRUCTIONS FOR 5-FLUOROURACIL /CALCIPOTRIENE CREAM:   5-fluorouracil /calcipotriene cream typically only needs to be used for 4-7 days. A thin layer should be applied twice a day to the treatment areas recommended by your physician.   If your physician prescribed you separate tubes of 5-fluourouracil and calcipotriene, apply a thin layer of 5-fluorouracil  followed by a thin layer of calcipotriene.   Avoid contact with your eyes, nostrils, and mouth. Do not use 5-fluorouracil /calcipotriene cream on infected or open wounds.   You will develop redness, irritation and some crusting at areas where you have pre-cancer damage/actinic keratoses. IF  YOU DEVELOP PAIN, BLEEDING, OR SIGNIFICANT CRUSTING, STOP THE TREATMENT EARLY - you have already gotten a good response and the actinic keratoses should clear up  well.  Wash your hands after applying 5-fluorouracil  5% cream on your skin.   A moisturizer or sunscreen with a minimum SPF 30 should be applied each morning.   Once you have finished the treatment, you can apply a thin layer of Vaseline twice a day to irritated areas to soothe and calm the areas more quickly. If you experience significant discomfort, contact your physician.  For some patients it is necessary to repeat the treatment for best results.  SIDE EFFECTS: When using 5-fluorouracil /calcipotriene cream, you may have mild irritation, such as redness, dryness, swelling, or a mild burning sensation. This usually resolves within 2 weeks. The more actinic keratoses you have, the more redness and inflammation you can expect during treatment. Eye irritation has been reported rarely. If this occurs, please let us  know.  If you have any trouble using this cream, please call the office. If you have any other questions about this information, please do not hesitate to ask me before you leave the office. Rosacea  What is rosacea? Rosacea (say: ro-zay-sha) is a common skin disease that usually begins as a trend of flushing or blushing easily.  As rosacea progresses, a persistent redness in the center of the face will develop and may gradually spread beyond the nose and cheeks to the forehead and chin.  In some cases, the ears, chest, and back could be affected.  Rosacea may appear as tiny blood vessels or small red bumps that occur in crops.  Frequently they can contain pus, and are called "pustules".  If the bumps do not contain pus, they are referred to as "papules".  Rarely, in prolonged, untreated cases of rosacea, the oil glands of the nose and cheeks may become permanently enlarged.  This is called rhinophyma, and is seen more frequently in men.  Signs and Risks In its beginning stages, rosacea tends to come and go, which makes it difficult to recognize.  It can start as intermittent flushing of  the face.  Eventually, blood vessels may become permanently visible.  Pustules and papules can appear, but can be mistaken for adult acne.  People of all races, ages, genders and ethnic groups are at risk of developing rosacea.  However, it is more common in women (especially around menopause) and adults with fair skin between the ages of 69 and 49.  Treatment Dermatologists typically recommend a combination of treatments to effectively manage rosacea.  Treatment can improve symptoms and may stop the progression of the rosacea.  Treatment may involve both topical and oral medications.  The tetracycline antibiotics are often used for their anti-inflammatory effect; however, because of the possibility of developing antibiotic resistance, they should not be used long term at full dose.  For dilated blood vessels the options include electrodessication (uses electric current through a small needle), laser treatment, and cosmetics to hide the redness.   With all forms of treatment, improvement is a slow process, and patients may not see any results for the first 3-4 weeks.  It is very important to avoid the sun and other triggers.  Patients must wear sunscreen daily.  Skin Care Instructions: Cleanse the skin with a mild soap such as CeraVe cleanser, Cetaphil cleanser, or Dove soap once or twice daily as needed. Moisturize with Eucerin Redness Relief Daily Perfecting Lotion (has a subtle green tint), CeraVe Moisturizing Cream,  or Oil of Olay Daily Moisturizer with sunscreen every morning and/or night as recommended. Makeup should be "non-comedogenic" (won't clog pores) and be labeled "for sensitive skin". Good choices for cosmetics are: Neutrogena, Almay, and Physician's Formula.  Any product with a green tint tends to offset a red complexion. If your eyes are dry and irritated, use artificial tears 2-3 times per day and cleanse the eyelids daily with baby shampoo.  Have your eyes examined at least every 2 years.   Be sure to tell your eye doctor that you have rosacea. Alcoholic beverages tend to cause flushing of the skin, and may make rosacea worse. Always wear sunscreen, protect your skin from extreme hot and cold temperatures, and avoid spicy foods, hot drinks, and mechanical irritation such as rubbing, scrubbing, or massaging the face.  Avoid harsh skin cleansers, cleansing masks, astringents, and exfoliation. If a particular product burns or makes your face feel tight, then it is likely to flare your rosacea. If you are having difficulty finding a sunscreen that you can tolerate, you may try switching to a chemical-free sunscreen.  These are ones whose active ingredient is zinc oxide or titanium dioxide only.  They should also be fragrance free, non-comedogenic, and labeled for sensitive skin. Rosacea triggers may vary from person to person.  There are a variety of foods that have been reported to trigger rosacea.  Some patients find that keeping a diary of what they were doing when they flared helps them avoid triggers.   Due to recent changes in healthcare laws, you may see results of your pathology and/or laboratory studies on MyChart before the doctors have had a chance to review them. We understand that in some cases there may be results that are confusing or concerning to you. Please understand that not all results are received at the same time and often the doctors may need to interpret multiple results in order to provide you with the best plan of care or course of treatment. Therefore, we ask that you please give us  2 business days to thoroughly review all your results before contacting the office for clarification. Should we see a critical lab result, you will be contacted sooner.   If You Need Anything After Your Visit  If you have any questions or concerns for your doctor, please call our main line at [phone] and press option 4 to reach your doctor's medical assistant. If no one answers,  please leave a voicemail as directed and we will return your call as soon as possible. Messages left after 4 pm will be answered the following business day.   You may also send us  a message via MyChart. We typically respond to MyChart messages within 1-2 business days.  For prescription refills, please ask your pharmacy to contact our office. Our fax number is [phone].  If you have an urgent issue when the clinic is closed that cannot wait until the next business day, you can page your doctor at the number below.    Please note that while we do our best to be available for urgent issues outside of office hours, we are not available 24/7.   If you have an urgent issue and are unable to reach us , you may choose to seek medical care at your doctor's office, retail clinic, urgent care center, or emergency room.  If you have a medical emergency, please immediately call 911 or go to the emergency department.  Pager Numbers  - Dr. Hester: [phone]  - Dr.  Jackquline: [phone]  - Dr. Claudene: [phone]   In the event of inclement weather, please call our main line at [phone] for an update on the status of any delays or closures.  Dermatology Medication Tips: Please keep the boxes that topical medications come in in order to help keep track of the instructions about where and how to use these. Pharmacies typically print the medication instructions only on the boxes and not directly on the medication tubes.   If your medication is too expensive, please contact our office at [phone] option 4 or send us  a message through MyChart.   We are unable to tell what your co-pay for medications will be in advance as this is different depending on your insurance coverage. However, we may be able to find a substitute medication at lower cost or fill out paperwork to get insurance to cover a needed medication.   If a prior authorization is required to get your medication covered by your  insurance company, please allow us  1-2 business days to complete this process.  Drug prices often vary depending on where the prescription is filled and some pharmacies may offer cheaper prices.  The website www.goodrx.com contains coupons for medications through different pharmacies. The prices here do not account for what the cost may be with help from insurance (it may be cheaper with your insurance), but the website can give you the price if you did not use any insurance.  - You can print the associated coupon and take it with your prescription to the pharmacy.  - You may also stop by our office during regular business hours and pick up a GoodRx coupon card.  - If you need your prescription sent electronically to a different pharmacy, notify our office through [facility] or by phone at [phone] option 4.     Si Usted Necesita Algo Despus de Su Visita  Tambin puede enviarnos un mensaje a travs de Clinical Cytogeneticist. Por lo general respondemos a los mensajes de MyChart en el transcurso de 1 a 2 das hbiles.  Para renovar recetas, por favor pida a su farmacia que se ponga en contacto con nuestra oficina. Randi lakes de fax es [place] [phone].  Si tiene un asunto urgente cuando la clnica est cerrada y que no puede esperar hasta el siguiente da hbil, puede llamar/localizar a su doctor(a) al nmero que aparece a continuacin.   Por favor, tenga en cuenta que aunque hacemos todo lo posible para estar disponibles para asuntos urgentes fuera del horario de [place], no estamos disponibles las 24 horas del da, los 7 [address]  [phone] de la [place].   Si tiene un problema urgente y no puede comunicarse con nosotros, puede optar por buscar atencin mdica  en el consultorio de su doctor(a), en una clnica privada, en un centro de atencin urgente o en una sala de emergencias.  Si tiene engineer, drilling, por favor llame inmediatamente al 911 o vaya a la sala de emergencias.  Nmeros de  bper  - Dr. Hester: [phone]  - Dra. Jackquline: [phone]  - Dr. Claudene: [phone]   En caso de inclemencias del tiempo, por favor llame a landry capes principal al [phone] para una actualizacin sobre el [place] de cualquier retraso o cierre.  Consejos para la medicacin en dermatologa: Por favor, guarde las cajas en las que vienen los medicamentos de uso tpico para ayudarle a seguir las instrucciones sobre dnde y cmo usarlos. Las farmacias generalmente imprimen las instrucciones del medicamento slo en las cajas  y no directamente en los tubos del medicamento.   Si su medicamento es muy caro, por favor, pngase en contacto con landry rieger llamando al [phone] y presione la opcin 4 o envenos un mensaje a travs de Clinical Cytogeneticist.   No podemos decirle cul ser su copago por los medicamentos por adelantado ya que esto es diferente dependiendo de la cobertura de su seguro. Sin embargo, es posible que podamos encontrar un medicamento sustituto a audiological scientist un formulario para que el seguro cubra el medicamento que se considera necesario.   Si se requiere una autorizacin previa para que su compaa de seguros cubra su medicamento, por favor permtanos de 1 a 2 das hbiles para completar este proceso.  Los precios de los medicamentos varan con frecuencia dependiendo del environmental consultant de dnde se surte la receta y alguna farmacias pueden ofrecer precios ms baratos.  El sitio web www.goodrx.com tiene cupones para medicamentos de health and safety inspector. Los precios aqu no tienen en cuenta lo que podra costar con la ayuda del seguro (puede ser ms barato con su seguro), pero el sitio web puede darle el precio si no utiliz tourist information centre manager.  - Puede imprimir el cupn correspondiente y llevarlo con su receta a la farmacia.  - Tambin puede pasar por nuestra oficina durante el horario de atencin regular y education officer, museum una tarjeta de cupones de GoodRx.  - Si necesita que su receta se  enve electrnicamente a una farmacia diferente, informe a nuestra oficina a travs de MyChart de [facility] o por telfono llamando al [phone] y presione la opcin 4.

## 2023-08-01 NOTE — Progress Notes (Signed)
 Follow-Up Visit   Subjective  Christine Adams is a 43 y.o. female who presents for the following: Skin Cancer Screening and Full Body Skin Exam  The patient presents for Total-Body Skin Exam (TBSE) for skin cancer screening and mole check. The patient has spots, moles and lesions to be evaluated, some may be new or changing.  She has a history of AK at the right nasal ala, treated two rounds with 5FU/Calcipotriene. Both times area crusted up and then healed.  The following portions of the chart were reviewed this encounter and updated as appropriate: medications, allergies, medical history  Review of Systems:  No other skin or systemic complaints except as noted in HPI or Assessment and Plan.  Objective  Well appearing patient in no apparent distress; mood and affect are within normal limits.  A full examination was performed including scalp, head, eyes, ears, nose, lips, neck, chest, axillae, abdomen, back, buttocks, bilateral upper extremities, bilateral lower extremities, hands, feet, fingers, toes, fingernails, and toenails. All findings within normal limits unless otherwise noted below.   Relevant physical exam findings are noted in the Assessment and Plan.    Assessment & Plan   SKIN CANCER SCREENING PERFORMED TODAY.  ACTINIC DAMAGE - Chronic condition, secondary to cumulative UV/sun exposure - diffuse scaly erythematous macules with underlying dyspigmentation - Recommend daily broad spectrum sunscreen SPF 30+ to sun-exposed areas, reapply every 2 hours as needed.  - Staying in the shade or wearing long sleeves, sun glasses (UVA+UVB protection) and wide brim hats (4-inch brim around the entire circumference of the hat) are also recommended for sun protection.  - Call for new or changing lesions.  LENTIGINES, SEBORRHEIC KERATOSES, HEMANGIOMAS - Benign normal skin lesions - Benign-appearing - Call for any changes  MELANOCYTIC NEVI - Tan-brown and/or pink-flesh-colored  symmetric macules and papules, including bilateral buttocks. - Benign appearing on exam today. Discussed shave removal of irritated nevi on face. Patient may consider.  - Observation - Call clinic for new or changing moles - Recommend daily use of broad spectrum spf 30+ sunscreen to sun-exposed areas.   PRECANCEROUS ACTINIC KERATOSIS - site of PreCancerous Actinic Keratosis smooth pink macule today at right nasal ala. Stable compared to photos 03/28/2023. Patient may re-treat right nasal ala with 5FU/Calcipotriene cream BID x 7 days. Pt has at home.  RTC if any changes or concerns, recheck on f/up  5-fluorouracil /calcipotriene cream is is a type of field treatment used to treat precancers, thin skin cancers, and areas of sun damage. Expected reaction includes irritation and mild inflammation potentially progressing to more severe inflammation including redness, scaling, crusting and open sores/erosions.  If too much irritation occurs, ensure application of only a thin layer and decrease frequency of use to achieve a tolerable level of inflammation. Recommend applying Vaseline ointment to open sores as needed.  Minimize sun exposure while under treatment. Recommend daily broad spectrum sunscreen SPF 30+ to sun-exposed areas, reapply every 2 hours as needed.  - these may recur and new lesions may form requiring treatment to prevent transformation into skin cancer - observe for new or changing spots and contact [facility] for appointment if occur - photoprotection with sun protective clothing; sunglasses and broad spectrum sunscreen with SPF of at least 30 + and frequent self skin exams recommended - yearly exams by a dermatologist recommended for persons with history of PreCancerous Actinic Keratoses  Fibrous Papule - R lower nasal dorsum 2.0 mm firm flesh papule, without features suspicious for malignancy on dermoscopy  -  Benign-appearing.  Observation.  Call clinic for new or changing  lesions.   ROSACEA Exam Mid face erythema with telangiectasias ; small pink papules at left nasolabial and left perioral. History of papules at occipital scalp- clear today.  Chronic and persistent condition with duration or expected duration over one year. Condition is symptomatic/ bothersome to patient. Not currently at goal.  Rosacea is a chronic progressive skin condition usually affecting the face of adults, causing redness and/or acne bumps. It is treatable but not curable. It sometimes affects the eyes (ocular rosacea) as well. It may respond to topical and/or systemic medication and can flare with stress, sun exposure, alcohol, exercise, topical steroids (including hydrocortisone /cortisone 10) and some foods.  Daily application of broad spectrum spf 30+ sunscreen to face is recommended to reduce flares.   Treatment Plan Continue [facility] metronidazole/ivermectin/azelaic acid twice daily as needed to affected areas on the face.  Continue prescription tretinion 0.025 % with hyaluronic acid sent through [facility]. Advised to use cautiously since sometimes can flare rosacea but she has tolerated in the past. Restart doxycycline  20 mg PO bid prn flares   Topical retinoid medications like tretinoin/Retin-A, adapalene/Differin, tazarotene/Fabior, and Epiduo/Epiduo Forte can cause dryness and irritation when first started. Only apply a pea-sized amount to the entire affected area. Avoid applying it around the eyes, edges of mouth and creases at the nose. If you experience irritation, use a good moisturizer first and/or apply the medicine less often. If you are doing well with the medicine, you can increase how often you use it until you are applying every night. Be careful with sun protection while using this medication as it can make you sensitive.  Counseling for BBL / IPL / Laser and Coordination of Care Discussed the treatment option of Broad Band Light (BBL) /Intense Pulsed Light  (IPL)/ Laser for skin discoloration, including brown spots and redness.  Typically we recommend at least 1-3 treatment sessions about 5-8 weeks apart for best results.  Cannot have tanned skin when BBL performed, and regular use of sunscreen/photoprotection is advised after the procedure to help maintain results. The patient's condition may also require maintenance treatments in the future.  The fee for BBL / laser treatments is $350 per treatment session for the whole face.  A fee can be quoted for other parts of the body.  Insurance typically does not pay for BBL/laser treatments and therefore the fee is an out-of-pocket cost. Recommend prophylactic valtrex treatment. Once scheduled for procedure, will send Rx in prior to patient's appointment.   Acrochordons (Skin Tags) - Fleshy, skin-colored pedunculated papules - Benign appearing.  - Observe. - If desired, they can be removed with an in office procedure that is not covered by insurance. - Please call the clinic if you notice any new or changing lesions.  Irritant Dermatitis  Exam: Left thumb and index with pink scaly patches.  Treatment Plan: Start Clobetasol  Cream Apply to AA finger/thumb twice a day until improved. Avoid applying to face, groin, and axilla. Use as directed. Long-term use can cause thinning of the skin.  Avoid picking. Recommend mild soap and moisturizing cream 1-2 times daily.     DERMATOFIBROMA Exam: Firm pink/brown papulenodule with dimple sign at left upper arm.  Treatment Plan: A dermatofibroma is a benign growth possibly related to trauma, such as an insect bite, cut from shaving, or inflamed acne-type bump.  Treatment options to remove include shave or excision with resulting scar and risk of recurrence.  Since benign-appearing  and not bothersome, will observe for now.    Return in about 1 year (around 07/31/2024) for TBSE, Hx AKs.  IAndrea Kerns, CMA, am acting as scribe for Rexene Rattler, MD  .   Documentation: I have reviewed the above documentation for accuracy and completeness, and I agree with the above.  Rexene Rattler, MD

## 2023-08-03 ENCOUNTER — Other Ambulatory Visit: Payer: Self-pay

## 2023-08-03 MED ORDER — AMOXICILLIN-POT CLAVULANATE 875-125 MG PO TABS
1.0000 | ORAL_TABLET | Freq: Two times a day (BID) | ORAL | 0 refills | Status: DC
  Filled 2023-08-03: qty 14, 7d supply, fill #0

## 2023-08-05 ENCOUNTER — Other Ambulatory Visit: Payer: Self-pay | Admitting: Medical Genetics

## 2023-08-07 ENCOUNTER — Other Ambulatory Visit: Payer: Self-pay | Attending: Medical Genetics

## 2023-08-14 ENCOUNTER — Other Ambulatory Visit: Payer: Self-pay

## 2023-08-14 DIAGNOSIS — R42 Dizziness and giddiness: Secondary | ICD-10-CM | POA: Diagnosis not present

## 2023-08-14 DIAGNOSIS — R052 Subacute cough: Secondary | ICD-10-CM | POA: Diagnosis not present

## 2023-08-14 DIAGNOSIS — J209 Acute bronchitis, unspecified: Secondary | ICD-10-CM | POA: Diagnosis not present

## 2023-08-14 MED ORDER — AZITHROMYCIN 250 MG PO TABS
ORAL_TABLET | ORAL | 0 refills | Status: DC
  Filled 2023-08-14: qty 6, 5d supply, fill #0

## 2023-08-14 MED ORDER — PREDNISONE 10 MG PO TABS
ORAL_TABLET | ORAL | 0 refills | Status: AC
  Filled 2023-08-14: qty 21, 6d supply, fill #0

## 2023-08-14 MED ORDER — HYDROCODONE BIT-HOMATROP MBR 5-1.5 MG/5ML PO SOLN
5.0000 mL | Freq: Every evening | ORAL | 0 refills | Status: DC | PRN
  Filled 2023-08-14: qty 70, 14d supply, fill #0

## 2023-08-15 ENCOUNTER — Other Ambulatory Visit: Payer: Self-pay

## 2023-08-15 MED ORDER — FLUTICASONE PROPIONATE HFA 110 MCG/ACT IN AERO
1.0000 | INHALATION_SPRAY | Freq: Two times a day (BID) | RESPIRATORY_TRACT | 0 refills | Status: AC
  Filled 2023-08-15: qty 12, 60d supply, fill #0

## 2023-09-04 ENCOUNTER — Other Ambulatory Visit: Payer: Self-pay

## 2023-09-04 DIAGNOSIS — F4322 Adjustment disorder with anxiety: Secondary | ICD-10-CM | POA: Diagnosis not present

## 2023-09-04 DIAGNOSIS — F902 Attention-deficit hyperactivity disorder, combined type: Secondary | ICD-10-CM | POA: Diagnosis not present

## 2023-09-04 MED ORDER — SERTRALINE HCL 100 MG PO TABS
100.0000 mg | ORAL_TABLET | Freq: Every day | ORAL | 1 refills | Status: DC
  Filled 2023-09-04 – 2023-09-27 (×2): qty 30, 30d supply, fill #0

## 2023-09-04 MED ORDER — TEMAZEPAM 15 MG PO CAPS
15.0000 mg | ORAL_CAPSULE | Freq: Every evening | ORAL | 1 refills | Status: AC | PRN
  Filled 2023-09-04 – 2023-09-27 (×2): qty 30, 30d supply, fill #0
  Filled 2023-11-14: qty 30, 30d supply, fill #1

## 2023-09-04 MED ORDER — DEXTROAMPHETAMINE SULFATE ER 15 MG PO CP24
15.0000 mg | ORAL_CAPSULE | Freq: Every morning | ORAL | 0 refills | Status: DC
  Filled 2024-01-23: qty 30, 30d supply, fill #0

## 2023-09-04 MED ORDER — DEXTROAMPHETAMINE SULFATE ER 15 MG PO CP24
15.0000 mg | ORAL_CAPSULE | Freq: Every day | ORAL | 0 refills | Status: DC
  Filled 2023-09-04 – 2023-09-27 (×2): qty 30, 30d supply, fill #0

## 2023-09-14 ENCOUNTER — Other Ambulatory Visit: Payer: Self-pay

## 2023-09-25 ENCOUNTER — Other Ambulatory Visit: Payer: Self-pay

## 2023-09-27 ENCOUNTER — Other Ambulatory Visit: Payer: Self-pay

## 2023-11-14 ENCOUNTER — Other Ambulatory Visit: Payer: Self-pay

## 2024-01-22 ENCOUNTER — Other Ambulatory Visit: Payer: Self-pay

## 2024-01-22 MED ORDER — NYSTATIN 100000 UNIT/GM EX CREA
TOPICAL_CREAM | Freq: Every day | CUTANEOUS | 3 refills | Status: AC | PRN
  Filled 2024-01-22: qty 30, 15d supply, fill #0

## 2024-01-22 MED ORDER — TRIAMCINOLONE ACETONIDE 0.1 % EX CREA
TOPICAL_CREAM | Freq: Every day | CUTANEOUS | 3 refills | Status: AC | PRN
  Filled 2024-01-22: qty 80, 30d supply, fill #0

## 2024-01-22 MED ORDER — MUPIROCIN CALCIUM 2 % EX CREA
TOPICAL_CREAM | Freq: Every day | CUTANEOUS | 3 refills | Status: AC
  Filled 2024-01-22: qty 30, 15d supply, fill #0

## 2024-01-23 ENCOUNTER — Other Ambulatory Visit: Payer: Self-pay

## 2024-01-24 ENCOUNTER — Other Ambulatory Visit: Payer: Self-pay

## 2024-01-25 ENCOUNTER — Other Ambulatory Visit: Payer: Self-pay

## 2024-01-26 ENCOUNTER — Other Ambulatory Visit: Payer: Self-pay | Admitting: Primary Care

## 2024-01-26 ENCOUNTER — Other Ambulatory Visit: Payer: Self-pay

## 2024-01-29 ENCOUNTER — Other Ambulatory Visit: Payer: Self-pay

## 2024-01-30 ENCOUNTER — Other Ambulatory Visit: Payer: Self-pay

## 2024-01-31 ENCOUNTER — Other Ambulatory Visit: Payer: Self-pay

## 2024-02-01 ENCOUNTER — Other Ambulatory Visit: Payer: Self-pay

## 2024-02-02 ENCOUNTER — Other Ambulatory Visit: Payer: Self-pay

## 2024-02-22 ENCOUNTER — Other Ambulatory Visit: Payer: Self-pay

## 2024-02-22 MED ORDER — AMOXICILLIN-POT CLAVULANATE 875-125 MG PO TABS
1.0000 | ORAL_TABLET | Freq: Two times a day (BID) | ORAL | 0 refills | Status: AC
Start: 2024-02-22 — End: 2024-02-29
  Filled 2024-02-22: qty 14, 7d supply, fill #0

## 2024-03-20 ENCOUNTER — Other Ambulatory Visit: Payer: Self-pay

## 2024-03-20 MED ORDER — COMIRNATY 30 MCG/0.3ML IM SUSY
0.3000 mL | PREFILLED_SYRINGE | Freq: Once | INTRAMUSCULAR | 0 refills | Status: AC
  Filled 2024-03-20: qty 0.3, 1d supply, fill #0

## 2024-03-25 ENCOUNTER — Other Ambulatory Visit: Payer: Self-pay | Admitting: Primary Care

## 2024-03-25 DIAGNOSIS — Z1231 Encounter for screening mammogram for malignant neoplasm of breast: Secondary | ICD-10-CM

## 2024-03-25 DIAGNOSIS — N6320 Unspecified lump in the left breast, unspecified quadrant: Secondary | ICD-10-CM

## 2024-03-25 DIAGNOSIS — N63 Unspecified lump in unspecified breast: Secondary | ICD-10-CM

## 2024-04-05 ENCOUNTER — Other Ambulatory Visit: Payer: Self-pay | Admitting: *Deleted

## 2024-04-05 DIAGNOSIS — Z006 Encounter for examination for normal comparison and control in clinical research program: Secondary | ICD-10-CM

## 2024-04-23 ENCOUNTER — Ambulatory Visit
Admission: RE | Admit: 2024-04-23 | Discharge: 2024-04-23 | Disposition: A | Discharge status: Home / Self Care | Source: Ambulatory Visit | Attending: Primary Care | Admitting: Primary Care

## 2024-04-23 ENCOUNTER — Encounter: Admitting: Primary Care

## 2024-04-23 DIAGNOSIS — N6320 Unspecified lump in the left breast, unspecified quadrant: Secondary | ICD-10-CM | POA: Insufficient documentation

## 2024-04-23 DIAGNOSIS — R92333 Mammographic heterogeneous density, bilateral breasts: Secondary | ICD-10-CM | POA: Diagnosis not present

## 2024-04-23 DIAGNOSIS — N63 Unspecified lump in unspecified breast: Secondary | ICD-10-CM

## 2024-04-23 DIAGNOSIS — Z1231 Encounter for screening mammogram for malignant neoplasm of breast: Secondary | ICD-10-CM | POA: Insufficient documentation

## 2024-04-23 DIAGNOSIS — N6322 Unspecified lump in the left breast, upper inner quadrant: Secondary | ICD-10-CM | POA: Diagnosis not present

## 2024-04-30 ENCOUNTER — Ambulatory Visit (INDEPENDENT_AMBULATORY_CARE_PROVIDER_SITE_OTHER): Admitting: Primary Care

## 2024-04-30 VITALS — BP 126/84 | HR 79 | Temp 99.3°F | Ht 65.5 in | Wt 196.1 lb

## 2024-04-30 DIAGNOSIS — F909 Attention-deficit hyperactivity disorder, unspecified type: Secondary | ICD-10-CM

## 2024-04-30 DIAGNOSIS — F419 Anxiety disorder, unspecified: Secondary | ICD-10-CM

## 2024-04-30 DIAGNOSIS — E039 Hypothyroidism, unspecified: Secondary | ICD-10-CM | POA: Diagnosis not present

## 2024-04-30 DIAGNOSIS — Z0000 Encounter for general adult medical examination without abnormal findings: Secondary | ICD-10-CM

## 2024-04-30 DIAGNOSIS — G4726 Circadian rhythm sleep disorder, shift work type: Secondary | ICD-10-CM

## 2024-04-30 DIAGNOSIS — E781 Pure hyperglyceridemia: Secondary | ICD-10-CM

## 2024-04-30 LAB — CBC
HCT: 43 % (ref 36.0–46.0)
Hemoglobin: 14.9 g/dL (ref 12.0–15.0)
MCHC: 34.5 g/dL (ref 30.0–36.0)
MCV: 86.5 fl (ref 78.0–100.0)
Platelets: 264 K/uL (ref 150.0–400.0)
RBC: 4.97 Mil/uL (ref 3.87–5.11)
RDW: 13 % (ref 11.5–15.5)
WBC: 7.5 K/uL (ref 4.0–10.5)

## 2024-04-30 LAB — COMPREHENSIVE METABOLIC PANEL WITH GFR
ALT: 24 U/L (ref 0–35)
AST: 19 U/L (ref 0–37)
Albumin: 4.5 g/dL (ref 3.5–5.2)
Alkaline Phosphatase: 77 U/L (ref 39–117)
BUN: 10 mg/dL (ref 6–23)
CO2: 27 meq/L (ref 19–32)
Calcium: 9 mg/dL (ref 8.4–10.5)
Chloride: 103 meq/L (ref 96–112)
Creatinine, Ser: 0.73 mg/dL (ref 0.40–1.20)
GFR: 101.58 mL/min (ref >60.00)
Glucose, Bld: 75 mg/dL (ref 70–99)
Potassium: 4 meq/L (ref 3.5–5.1)
Sodium: 137 meq/L (ref 135–145)
Total Bilirubin: 0.7 mg/dL (ref 0.2–1.2)
Total Protein: 7.1 g/dL (ref 6.0–8.3)

## 2024-04-30 LAB — LIPID PANEL
Cholesterol: 170 mg/dL (ref 0–200)
HDL: 39.6 mg/dL (ref >39.00)
LDL Cholesterol: 98 mg/dL (ref 0–99)
NonHDL: 130.28
Total CHOL/HDL Ratio: 4
Triglycerides: 163 mg/dL — ABNORMAL HIGH (ref 0.0–149.0)
VLDL: 32.6 mg/dL (ref 0.0–40.0)

## 2024-04-30 LAB — HEMOGLOBIN A1C: Hgb A1c MFr Bld: 5.2 % (ref 4.6–6.5)

## 2024-04-30 NOTE — Progress Notes (Signed)
 Subjective:    Patient ID: Christine Adams, female    DOB: June 05, 1981, 43 y.o.   MRN: [id number]  Christine Adams is a very pleasant 43 y.o. female who presents today for complete physical and follow up of chronic conditions.  Immunizations: -Tetanus: Completed in 2021 -Influenza: Completed this season  -HPV: Completed series   Diet: Fair diet.  Exercise: No regular exercise.  Eye exam: Completes annually  Dental exam: Completes semi-annually    Pap Smear: Completed UTD, follows with GYN Mammogram: Completed in October 2025  BP Readings from Last 3 Encounters:  04/30/24 126/84  04/13/23 102/70  03/28/23 (!) 131/90   Wt Readings from Last 3 Encounters:  04/30/24 196 lb 2 oz (89 kg)  04/13/23 169 lb (76.7 kg)  01/05/23 169 lb (76.7 kg)      Review of Systems  Constitutional:  Negative for unexpected weight change.  HENT:  Negative for rhinorrhea.   Respiratory:  Negative for cough and shortness of breath.   Cardiovascular:  Negative for chest pain.  Gastrointestinal:  Negative for constipation and diarrhea.  Genitourinary:  Negative for difficulty urinating.  Musculoskeletal:  Negative for arthralgias and myalgias.  Skin:  Negative for rash.  Allergic/Immunologic: Negative for environmental allergies.  Neurological:  Negative for dizziness and headaches.  Psychiatric/Behavioral:  The patient is not nervous/anxious.          Past Medical History:  Diagnosis Date   ADHD    Asthma    Not currently in controller meds   Back pain    BMI 30.0-30.9,adult    Chronic back pain 12/07/2021   Chronic cough 02/27/2013   Elevated BP without diagnosis of hypertension 04/25/2022   Hypothyroidism    Age 89 yrs   Insulin  resistance 10/02/2021   Joint pain 01/27/2022   Other fatigue    Postpartum anxiety- ongoing    Postpartum depression    Rosacea    Shortness of breath on exertion    Sleep walking disorder 01/22/2015   Stress and adjustment reaction 10/02/2021    Vitamin D  deficiency     Social History   Socioeconomic History   Marital status: Married    Spouse name: Ryan   Number of children: 1   Years of education: Not on file   Highest education level: Professional school degree (e.g., MD, DDS, DVM, JD)  Occupational History   Occupation: Oncology Nurse Practitioner    Employer: [employer]  Tobacco Use   Smoking status: Never   Smokeless tobacco: Never  Vaping Use   Vaping status: Never Used  Substance and Sexual Activity   Alcohol use: Yes    Alcohol/week: 1.0 standard drink of alcohol    Types: 1 Cans of beer per week    Comment: occasional   Drug use: Never   Sexual activity: Yes    Partners: Male    Birth control/protection: Rhythm, None    Comment: Iud removed June 2022  Other Topics Concern   Not on file  Social History Narrative   Married since 2018.Lives with husband and son.   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   Social Drivers of Corporate Investment Banker Strain: Low Risk  (04/30/2024)   Overall Financial Resource Strain (CARDIA)    Difficulty of Paying Living Expenses: Not hard at all  Food Insecurity: No Food Insecurity (04/30/2024)   Hunger Vital Sign    Worried About Running Out of Food in the Last Year: Never true    Ran  Out of Food in the Last Year: Never true  Transportation Needs: No Transportation Needs (04/30/2024)   PRAPARE - Administrator, Civil Service (Medical): No    Lack of Transportation (Non-Medical): No  Physical Activity: Sufficiently Active (04/30/2024)   Exercise Vital Sign    Days of Exercise per Week: 5 days    Minutes of Exercise per Session: 30 min  Stress: Stress Concern Present (04/30/2024)   [employer] of Occupational Health - Occupational Stress Questionnaire    Feeling of Stress: To some extent  Social Connections: Moderately Integrated (04/30/2024)   Social Connection and Isolation Panel    Frequency of Communication with Friends  and Family: More than three times a week    Frequency of Social Gatherings with Friends and Family: More than three times a week    Attends Religious Services: Never    Database Administrator or Organizations: Yes    Attends Engineer, Structural: More than 4 times per year    Marital Status: Married  Catering Manager Violence: Not on file    Past Surgical History:  Procedure Laterality Date   WISDOM TOOTH EXTRACTION      Family History  Problem Relation Age of Onset   Breast cancer Mother    Clotting disorder Mother    Cancer Mother    Miscarriages / Stillbirths Mother    Varicose Veins Mother    Obesity Father    Heart disease Father    Sudden Cardiac Death Father    Hypertension Father    Hyperlipidemia Father    ADD / ADHD Sister    Obesity Sister    Diabetes Maternal Grandmother    Mesothelioma Maternal Grandmother    Alcohol abuse Paternal Grandmother    Hypertension Paternal Grandfather     Allergies  Allergen Reactions   Molds & Smuts Itching    Dust mites, mold airborne     Current Outpatient Medications on File Prior to Visit  Medication Sig Dispense Refill   azelaic acid (AZELEX) 20 % cream Apply 1 application topically 2 (two) times daily. After skin is thoroughly washed and patted dry, gently but thoroughly massage a thin film of azelaic acid cream into the affected area twice daily, in the morning and evening.     cholecalciferol (VITAMIN D ) 1000 units tablet Take 3,000 Units by mouth daily.     clobetasol  cream (TEMOVATE ) 0.05 % Apply to affected area finger, thumb twice daily until improved. Avoid applying to face, groin, and axilla. Use as directed. Long-term use can cause thinning of the skin. 15 g 1   fluticasone  (FLOVENT  HFA) 110 MCG/ACT inhaler Inhale 1 puff into the lungs 2 (two) times daily for 14 days 12 g 0   levothyroxine  (SYNTHROID ) 137 MCG tablet Take 1 tablet (137 mcg total) by mouth every morning on an empty stomach with water only.   No food or other medications for 30 minutes. 90 tablet 2   mupirocin  cream (BACTROBAN ) 2 % Apply topically to affected area daily. 30 g 3   nystatin  cream (MYCOSTATIN ) Apply topically daily as needed. 30 g 3   Prenatal Vit-Fe Fumarate-FA (PRENATAL MULTIVITAMIN) TABS tablet Take 1 tablet by mouth daily at 12 noon.     temazepam  (RESTORIL ) 15 MG capsule Take 1 capsule (15 mg total) by mouth at bedtime as needed for sleep. 30 capsule 1   triamcinolone  cream (KENALOG ) 0.1 % Apply topically daily as needed. 80 g 3   valACYclovir (VALTREX)  500 MG tablet Take by mouth as needed.     No current facility-administered medications on file prior to visit.    BP 126/84   Pulse 79   Temp 99.3 F (37.4 C) (Oral)   Ht 5' 5.5 (1.664 m)   Wt 196 lb 2 oz (89 kg)   LMP 04/08/2024   SpO2 96%   BMI 32.14 kg/m  Objective:   Physical Exam HENT:     Right Ear: Tympanic membrane and ear canal normal.     Left Ear: Tympanic membrane and ear canal normal.  Eyes:     Pupils: Pupils are equal, round, and reactive to light.  Cardiovascular:     Rate and Rhythm: Normal rate and regular rhythm.  Pulmonary:     Effort: Pulmonary effort is normal.     Breath sounds: Normal breath sounds.  Abdominal:     General: Bowel sounds are normal.     Palpations: Abdomen is soft.     Tenderness: There is no abdominal tenderness.  Musculoskeletal:        General: Normal range of motion.     Cervical back: Neck supple.  Skin:    General: Skin is warm and dry.  Neurological:     Mental Status: She is alert and oriented to person, place, and time.     Cranial Nerves: No cranial nerve deficit.     Deep Tendon Reflexes:     Reflex Scores:      Patellar reflexes are 2+ on the right side and 2+ on the left side. Psychiatric:        Mood and Affect: Mood normal.     Physical Exam        Assessment & Plan:  Preventative health care Assessment & Plan: Immunizations UTD. Pap smear UTD. Follows with  GYN Mammogram UTD.  Discussed the importance of a healthy diet and regular exercise in order for weight loss, and to reduce the risk of further co-morbidity.  Exam stable. Labs pending.  Follow up in 1 year for repeat physical.    Acquired hypothyroidism Assessment & Plan: She is taking levothyroxine  correctly.  Continue levothyroxine  137 mcg daily. Repeat TSH pending.  Orders: -     TSH  Hypertriglyceridemia Assessment & Plan: Repeat lipid panel pending.  Work on a healthy diet and regular exercise in order for weight loss, and to reduce the risk of further co-morbidity.   Orders: -     Lipid panel -     Hemoglobin A1c -     Comprehensive metabolic panel with GFR -     CBC  Attention deficit hyperactivity disorder (ADHD), unspecified ADHD type Assessment & Plan: Controlled.  Remain off treatment. Continue to monitor.    Anxiety Assessment & Plan: Controlled!  Continue off sertraline .  Continue to monitor.    Sleep disorder, circadian, shift work type Assessment & Plan: Controlled.  Continue temazepam  15 mg PRN     Assessment and Plan Assessment & Plan         Comer MARLA Gaskins, NP      History of Present Illness

## 2024-04-30 NOTE — Assessment & Plan Note (Signed)
Controlled.  Remain off treatment. Continue to monitor. 

## 2024-04-30 NOTE — Assessment & Plan Note (Signed)
 Controlled.  Continue temazepam  15 mg PRN

## 2024-04-30 NOTE — Patient Instructions (Signed)
 Stop by the lab prior to leaving today. I will notify you of your results once received.   It was a pleasure to see you today!

## 2024-04-30 NOTE — Assessment & Plan Note (Signed)
 Controlled!  Continue off sertraline .  Continue to monitor.

## 2024-04-30 NOTE — Assessment & Plan Note (Signed)
Immunizations UTD. Pap smear UTD. Follows with GYN Mammogram UTD  Discussed the importance of a healthy diet and regular exercise in order for weight loss, and to reduce the risk of further co-morbidity.  Exam stable. Labs pending.  Follow up in 1 year for repeat physical.

## 2024-04-30 NOTE — Assessment & Plan Note (Signed)
 Repeat lipid panel pending.  Work on a healthy diet and regular exercise in order for weight loss, and to reduce the risk of further co-morbidity.

## 2024-04-30 NOTE — Assessment & Plan Note (Signed)
She is taking levothyroxine correctly.  Continue levothyroxine 137 mcg daily. Repeat TSH pending

## 2024-05-01 ENCOUNTER — Ambulatory Visit: Payer: Self-pay | Admitting: Primary Care

## 2024-05-01 LAB — TSH: TSH: 4.03 u[IU]/mL (ref 0.35–5.50)

## 2024-05-06 ENCOUNTER — Other Ambulatory Visit

## 2024-05-21 NOTE — Telephone Encounter (Signed)
 Left pt msg to call about upcoming appt 05/29/24.  Need to discuss BBL valtrex prophylactic protocol./sh

## 2024-05-27 ENCOUNTER — Other Ambulatory Visit: Payer: Self-pay

## 2024-05-27 ENCOUNTER — Telehealth: Payer: Self-pay

## 2024-05-27 DIAGNOSIS — Z8619 Personal history of other infectious and parasitic diseases: Secondary | ICD-10-CM

## 2024-05-27 MED ORDER — VALACYCLOVIR HCL 500 MG PO TABS
500.0000 mg | ORAL_TABLET | Freq: Two times a day (BID) | ORAL | 11 refills | Status: AC
  Filled 2024-05-27: qty 30, 15d supply, fill #0

## 2024-05-27 NOTE — Telephone Encounter (Signed)
 Patient called and left message on nurse line concerning her BBL appointment on Wednesday with Dr. Jackquline. She left message about numbing cream and if she need an rx sent to pharmacy.  Called and discussed with patient we do not normally apply numbing cream or send rx of numbing cream in for BBL.   Patient does have history of cold sores and would like rx sent to [facility] pharmacy. Rx sent.

## 2024-05-29 ENCOUNTER — Encounter: Payer: Self-pay | Admitting: Dermatology

## 2024-05-29 ENCOUNTER — Ambulatory Visit (INDEPENDENT_AMBULATORY_CARE_PROVIDER_SITE_OTHER): Payer: Self-pay | Admitting: Dermatology

## 2024-05-29 DIAGNOSIS — L814 Other melanin hyperpigmentation: Secondary | ICD-10-CM

## 2024-05-29 DIAGNOSIS — L719 Rosacea, unspecified: Secondary | ICD-10-CM

## 2024-05-29 NOTE — Progress Notes (Signed)
 Follow-Up Visit   Subjective  Christine Adams is a 43 y.o. female who presents for the following: Rosacea face, pt presents for BBL today   The following portions of the chart were reviewed this encounter and updated as appropriate: medications, allergies, medical history  Review of Systems:  No other skin or systemic complaints except as noted in HPI or Assessment and Plan.  Objective  Well appearing patient in no apparent distress; mood and affect are within normal limits.   A focused examination was performed of the following areas: face  Relevant exam findings are noted in the Assessment and Plan.  Before photos               1st pass (pulse count started at 388)   2nd pass   3rd Pass    Assessment & Plan   LENTIGINES face Exam: scattered tan macules    Treatment Plan BBL today Cont Valtrex 500mg  1 po for total of 7 days(BBL prophylactic Valtrex   [place] BBL - 05/29/24 1300      Patient Details   Skin Type: II    Anesthestic Cream Applied: No    Photo Takes: Yes    Consent Signed: Yes      Treatment Details   Date: 05/29/24    Treatment #: 1    Area: face    Filter: 1st Pass;2nd Pass;3rd Pass      1st Pass   Location: F   pigmented target cheeks  Device: 515 filter    BBL j/cm2: 10    PW Msec Sec: 10    Cooling Temp: 15    Pulses: 98   counter started at 388   15x45: this crystal used    15x15: --      2nd Pass   Location: F   pigmented target forehead, nose, chin, upper lip  Device: 515 filter    BBL j/cm2: 12    PW Msec Sec: 10    Cooling Temp: 15    Pulses: 182    15x15: this crystal used     Patient tolerated the procedure well.   Austin avoidance was stressed. The patient will call with any problems, questions or concerns prior to their next appointment.  Laser safety: Patient was advised in laser safety.  Patient was fitted with laser safety goggles and advised to keep eyes closed during procedure with goggles  on. Staff and provider ensured that patient and their own safety goggles were also on and eyes protected during procedure. Laser room door was secured and locked from the inside. Laser room door has laser safety sign affixed to the outside of the door.    ROSACEA Exam Mid face erythema with telangiectasias   Chronic and persistent condition with duration or expected duration over one year. Condition is symptomatic/ bothersome to patient. Not currently at goal.   Rosacea is a chronic progressive skin condition usually affecting the face of adults, causing redness and/or acne bumps. It is treatable but not curable. It sometimes affects the eyes (ocular rosacea) as well. It may respond to topical and/or systemic medication and can flare with stress, sun exposure, alcohol, exercise, topical steroids (including hydrocortisone /cortisone 10) and some foods.  Daily application of broad spectrum spf 30+ sunscreen to face is recommended to reduce flares.   Treatment Plan: BBL today  [place] BBL - 05/29/24 1300      Patient Details   Skin Type: II    Anesthestic Cream Applied: No  Photo Takes: Yes    Consent Signed: Yes      Treatment Details   Date: 05/29/24    Treatment #: 1    Area: face    Filter: 1st Pass;2nd Pass;3rd Pass      3rd Pass   Location: F   vascular target mid face  Device: 560 filter    BBL j/cm2: 15    PW Msec Sec: 15    Cooling Temp: 15    Pulses: 111    15x15: this crystal used      Patient tolerated the procedure well.   Austin avoidance was stressed. The patient will call with any problems, questions or concerns prior to their next appointment.  Laser safety: Patient was advised in laser safety.  Patient was fitted with laser safety goggles and advised to keep eyes closed during procedure with goggles on. Staff and provider ensured that patient and their own safety goggles were also on and eyes protected during procedure. Laser room door was secured and locked  from the inside. Laser room door has laser safety sign affixed to the outside of the door.      Return for as scheduled.  I, Grayce Saunas, RMA, am acting as scribe for Rexene Rattler, MD .   Documentation: I have reviewed the above documentation for accuracy and completeness, and I agree with the above.  Rexene Rattler, MD

## 2024-05-29 NOTE — Patient Instructions (Signed)

## 2024-06-03 ENCOUNTER — Telehealth: Payer: Self-pay

## 2024-06-03 NOTE — Telephone Encounter (Signed)
 Patient doing well after BBL 05/29/24.  She did have some swelling but otherwise no issues.lex

## 2024-06-05 ENCOUNTER — Other Ambulatory Visit: Payer: Self-pay

## 2024-06-05 MED ORDER — TEMAZEPAM 15 MG PO CAPS
15.0000 mg | ORAL_CAPSULE | Freq: Every evening | ORAL | 1 refills | Status: AC | PRN
  Filled 2024-06-05: qty 30, 30d supply, fill #0

## 2024-06-05 MED ORDER — SERTRALINE HCL 100 MG PO TABS
100.0000 mg | ORAL_TABLET | Freq: Every day | ORAL | 1 refills | Status: AC
  Filled 2024-06-05: qty 30, 30d supply, fill #0

## 2024-07-16 ENCOUNTER — Other Ambulatory Visit: Payer: Self-pay

## 2024-07-16 MED ORDER — TEMAZEPAM 15 MG PO CAPS
15.0000 mg | ORAL_CAPSULE | Freq: Every evening | ORAL | 1 refills | Status: AC | PRN
  Filled 2024-07-16: qty 30, 30d supply, fill #0

## 2024-07-16 MED ORDER — SERTRALINE HCL 100 MG PO TABS
100.0000 mg | ORAL_TABLET | Freq: Every day | ORAL | 1 refills | Status: AC
  Filled 2024-07-16: qty 30, 30d supply, fill #0

## 2024-08-06 ENCOUNTER — Encounter: Payer: Commercial Managed Care - PPO | Admitting: Dermatology
